# Patient Record
Sex: Male | Born: 1945 | Race: Black or African American | Hispanic: No | Marital: Married | Smoking: Never smoker
Health system: Southern US, Community
[De-identification: ages and names within clinical notes are randomized; demographics above are authoritative.]

## PROBLEM LIST (undated history)

## (undated) DIAGNOSIS — G629 Polyneuropathy, unspecified: Secondary | ICD-10-CM

## (undated) DIAGNOSIS — E785 Hyperlipidemia, unspecified: Secondary | ICD-10-CM

## (undated) DIAGNOSIS — E039 Hypothyroidism, unspecified: Secondary | ICD-10-CM

## (undated) DIAGNOSIS — C801 Malignant (primary) neoplasm, unspecified: Secondary | ICD-10-CM

## (undated) DIAGNOSIS — R188 Other ascites: Secondary | ICD-10-CM

## (undated) DIAGNOSIS — E119 Type 2 diabetes mellitus without complications: Secondary | ICD-10-CM

## (undated) DIAGNOSIS — I10 Essential (primary) hypertension: Secondary | ICD-10-CM

## (undated) DIAGNOSIS — C252 Malignant neoplasm of tail of pancreas: Principal | ICD-10-CM

## (undated) HISTORY — DX: Malignant (primary) neoplasm, unspecified: C80.1

## (undated) HISTORY — DX: Malignant neoplasm of tail of pancreas: C25.2

## (undated) HISTORY — DX: Hyperlipidemia, unspecified: E78.5

## (undated) HISTORY — DX: Polyneuropathy, unspecified: G62.9

## (undated) HISTORY — DX: Other ascites: R18.8

## (undated) HISTORY — DX: Essential (primary) hypertension: I10

## (undated) HISTORY — DX: Type 2 diabetes mellitus without complications: E11.9

## (undated) HISTORY — PX: HEMORRHOID SURGERY: SHX153

---

## 2007-10-12 ENCOUNTER — Ambulatory Visit: Payer: Self-pay | Admitting: Gastroenterology

## 2013-02-17 HISTORY — PX: COLONOSCOPY: SHX174

## 2013-03-11 ENCOUNTER — Ambulatory Visit: Payer: Self-pay | Admitting: Gastroenterology

## 2013-03-15 LAB — PATHOLOGY REPORT

## 2014-02-17 DIAGNOSIS — C801 Malignant (primary) neoplasm, unspecified: Secondary | ICD-10-CM

## 2014-02-17 DIAGNOSIS — R188 Other ascites: Secondary | ICD-10-CM

## 2014-02-17 HISTORY — DX: Malignant (primary) neoplasm, unspecified: C80.1

## 2014-02-17 HISTORY — DX: Other ascites: R18.8

## 2014-02-17 HISTORY — PX: OTHER SURGICAL HISTORY: SHX169

## 2014-04-18 ENCOUNTER — Ambulatory Visit
Admit: 2014-04-18 | Disposition: A | Discharge status: Home / Self Care | Payer: Self-pay | Attending: Hematology and Oncology | Admitting: Hematology and Oncology

## 2014-04-20 ENCOUNTER — Ambulatory Visit: Payer: Self-pay | Admitting: Family Medicine

## 2014-04-20 ENCOUNTER — Emergency Department: Payer: Self-pay | Admitting: Emergency Medicine

## 2014-04-21 ENCOUNTER — Ambulatory Visit
Admit: 2014-04-21 | Disposition: A | Discharge status: Home / Self Care | Payer: Self-pay | Attending: Hematology and Oncology | Admitting: Hematology and Oncology

## 2014-04-21 ENCOUNTER — Ambulatory Visit: Payer: Self-pay | Admitting: Hematology and Oncology

## 2014-04-25 ENCOUNTER — Ambulatory Visit: Payer: Self-pay | Admitting: Hematology and Oncology

## 2014-04-26 ENCOUNTER — Encounter: Payer: Self-pay | Admitting: General Surgery

## 2014-04-26 ENCOUNTER — Ambulatory Visit (INDEPENDENT_AMBULATORY_CARE_PROVIDER_SITE_OTHER): Payer: Medicare Other | Admitting: General Surgery

## 2014-04-26 VITALS — BP 104/68 | HR 78 | Resp 14 | Ht 67.0 in | Wt 138.0 lb

## 2014-04-26 DIAGNOSIS — C252 Malignant neoplasm of tail of pancreas: Secondary | ICD-10-CM

## 2014-04-26 NOTE — Patient Instructions (Signed)

## 2014-04-26 NOTE — Progress Notes (Signed)
Patient ID: Harry Roman, male   DOB: November 07, 1945, 69 y.o.   MRN: [id number]  Chief Complaint  Patient presents with  . Other    eval port placement    HPI Harry Roman is a 69 y.o. male here today for a evaluation of a port placement. He has peritoneal carcinomatosis and ascites, suspected pancreatic CA.  Patient starts chemo on 05/02/14. Weight loss 30-40 pounds over the past year.  He is here today with his wife.  HPI  Past Medical History  Diagnosis Date  . Hypertension   . Diabetes mellitus without complication   . Hyperlipidemia   . Cancer 2016    pancreas  . Ascites 2016    Past Surgical History  Procedure Laterality Date  . Hemorrhoid surgery    . Colonoscopy  Jan 2015    Dr Donnella Sham  . Peritoneal fluid aspiration  2016    History reviewed. No pertinent family history.  Social History History  Substance Use Topics  . Smoking status: Never Smoker   . Smokeless tobacco: Not on file  . Alcohol Use: No    No Known Allergies  Current Outpatient Prescriptions  Medication Sig Dispense Refill  . amLODipine (NORVASC) 10 MG tablet Take 10 mg by mouth daily.    Marland Kitchen aspirin 81 MG tablet Take 81 mg by mouth daily.    . carvedilol (COREG) 12.5 MG tablet Take 12.5 mg by mouth 2 (two) times daily with a meal.    . enalapril (VASOTEC) 20 MG tablet Take 20 mg by mouth daily.    Marland Kitchen glipiZIDE (GLUCOTROL) 10 MG tablet Take 10 mg by mouth 2 (two) times daily before a meal.    . hydrochlorothiazide (HYDRODIURIL) 25 MG tablet Take 25 mg by mouth daily.    . insulin detemir (LEVEMIR) 100 UNIT/ML injection Inject 100 Units into the skin at bedtime.    Marland Kitchen levothyroxine (SYNTHROID, LEVOTHROID) 50 MCG tablet Take 50 mcg by mouth daily before breakfast.    . lovastatin (MEVACOR) 10 MG tablet Take 10 mg by mouth at bedtime.    . metFORMIN (GLUCOPHAGE) 1000 MG tablet Take 1,000 mg by mouth 2 (two) times daily with a meal.    . ondansetron (ZOFRAN) 4 MG tablet Take 1 tablet by mouth every  8 (eight) hours as needed.  0  . oxyCODONE (OXY IR/ROXICODONE) 5 MG immediate release tablet Take 5 mg by mouth every 6 (six) hours as needed. for pain  0  . potassium chloride (MICRO-K) 10 MEQ CR capsule Take 10 mEq by mouth daily.    . sitaGLIPtin (JANUVIA) 50 MG tablet Take 50 mg by mouth daily.     No current facility-administered medications for this visit.    Review of Systems Review of Systems  Constitutional: Positive for unexpected weight change.  Respiratory: Negative.   Cardiovascular: Negative.     Blood pressure 104/68, pulse 78, resp. rate 14, height 5\' 7"  (1.702 m), weight 138 lb (62.596 kg).  Physical Exam Physical Exam  Constitutional: He is oriented to person, place, and time. He appears well-developed and well-nourished.  Eyes: Conjunctivae are normal. No scleral icterus.  Neck: Neck supple.  Cardiovascular: Normal rate, regular rhythm and normal heart sounds.   Pulmonary/Chest: Effort normal and breath sounds normal.  Abdominal: He exhibits distension.  Lymphadenopathy:    He has no cervical adenopathy.  Neurological: He is alert and oriented to person, place, and time.  Skin: Skin is warm and dry.    Data  Reviewed Notes form Oncology  Assessment    CA pancreas     Plan   Discussed port placement, risks and benefits Pt is agreeable.        The risks associated with central venous access including arterial, pulmonary and venous injury were reviewed. The possible need for additional treatment if pulmonary injury occurs (chest tube placement) was discussed.    Finleigh Cheong G 04/26/2014, 10:53 AM

## 2014-04-27 ENCOUNTER — Ambulatory Visit: Payer: Self-pay | Admitting: General Surgery

## 2014-05-01 ENCOUNTER — Encounter: Payer: Self-pay | Admitting: General Surgery

## 2014-05-01 ENCOUNTER — Ambulatory Visit: Payer: Self-pay | Admitting: Hematology and Oncology

## 2014-05-01 DIAGNOSIS — C786 Secondary malignant neoplasm of retroperitoneum and peritoneum: Secondary | ICD-10-CM | POA: Diagnosis not present

## 2014-05-02 ENCOUNTER — Encounter: Payer: Self-pay | Admitting: General Surgery

## 2014-05-08 ENCOUNTER — Encounter: Payer: Self-pay | Admitting: General Surgery

## 2014-05-08 ENCOUNTER — Ambulatory Visit (INDEPENDENT_AMBULATORY_CARE_PROVIDER_SITE_OTHER): Payer: Medicare Other | Admitting: General Surgery

## 2014-05-08 VITALS — BP 116/68 | HR 70 | Resp 16 | Ht 67.0 in | Wt 134.0 lb

## 2014-05-08 DIAGNOSIS — C252 Malignant neoplasm of tail of pancreas: Secondary | ICD-10-CM

## 2014-05-08 NOTE — Progress Notes (Signed)
This is a 69 year old male here today for his post op port placement done on 05/01/14.  Incision looks clean and lungs are clear. Patient will return as needed. Patient can call or come back for any questions or concerns.

## 2014-05-10 ENCOUNTER — Ambulatory Visit: Payer: Medicare Other | Admitting: General Surgery

## 2014-05-11 LAB — COMPREHENSIVE METABOLIC PANEL
Albumin: 2.5 g/dL — ABNORMAL LOW
Alkaline Phosphatase: 434 U/L — ABNORMAL HIGH
Anion Gap: 7 (ref 7–16)
BUN: 15 mg/dL
Bilirubin,Total: 1.2 mg/dL
Calcium, Total: 8.5 mg/dL — ABNORMAL LOW
Chloride: 106 mmol/L
Co2: 23 mmol/L
Creatinine: 0.84 mg/dL
EGFR (African American): >60
EGFR (Non-African Amer.): >60
Glucose: 171 mg/dL — ABNORMAL HIGH
Potassium: 4.4 mmol/L
SGOT(AST): 81 U/L — ABNORMAL HIGH
SGPT (ALT): 40 U/L
Sodium: 136 mmol/L
Total Protein: 6.7 g/dL

## 2014-05-11 LAB — CBC CANCER CENTER
Basophil #: 0 x10 3/mm (ref 0.0–0.1)
Basophil %: 0.3 %
Eosinophil #: 0.1 x10 3/mm (ref 0.0–0.7)
Eosinophil %: 3.1 %
HCT: 31.6 % — ABNORMAL LOW (ref 40.0–52.0)
HGB: 10.3 g/dL — ABNORMAL LOW (ref 13.0–18.0)
Lymphocyte #: 0.9 x10 3/mm — ABNORMAL LOW (ref 1.0–3.6)
Lymphocyte %: 23.2 %
MCH: 25.7 pg — ABNORMAL LOW (ref 26.0–34.0)
MCHC: 32.8 g/dL (ref 32.0–36.0)
MCV: 79 fL — ABNORMAL LOW (ref 80–100)
Monocyte #: 0.4 x10 3/mm (ref 0.2–1.0)
Monocyte %: 11.6 %
Neutrophil #: 2.4 x10 3/mm (ref 1.4–6.5)
Neutrophil %: 61.8 %
Platelet: 225 x10 3/mm (ref 150–440)
RBC: 4.02 10*6/uL — ABNORMAL LOW (ref 4.40–5.90)
RDW: 17.4 % — ABNORMAL HIGH (ref 11.5–14.5)
WBC: 3.9 x10 3/mm (ref 3.8–10.6)

## 2014-05-18 LAB — COMPREHENSIVE METABOLIC PANEL
Albumin: 2.4 g/dL — ABNORMAL LOW
Alkaline Phosphatase: 254 U/L — ABNORMAL HIGH
Anion Gap: 4 — ABNORMAL LOW (ref 7–16)
BUN: 15 mg/dL
Bilirubin,Total: 0.9 mg/dL
Calcium, Total: 8.5 mg/dL — ABNORMAL LOW
Chloride: 108 mmol/L
Co2: 23 mmol/L
Creatinine: 0.72 mg/dL
EGFR (African American): >60
EGFR (Non-African Amer.): >60
Glucose: 103 mg/dL — ABNORMAL HIGH
Potassium: 4.1 mmol/L
SGOT(AST): 32 U/L
SGPT (ALT): 19 U/L
Sodium: 135 mmol/L
Total Protein: 6.5 g/dL

## 2014-05-18 LAB — CREATININE, SERUM: Creatine, Serum: 0.72

## 2014-05-18 LAB — CBC CANCER CENTER
Basophil #: 0 x10 3/mm (ref 0.0–0.1)
Basophil %: 0.4 %
Eosinophil #: 0.1 x10 3/mm (ref 0.0–0.7)
Eosinophil %: 2.4 %
HCT: 30.3 % — ABNORMAL LOW (ref 40.0–52.0)
HGB: 10.1 g/dL — ABNORMAL LOW (ref 13.0–18.0)
Lymphocyte #: 0.6 x10 3/mm — ABNORMAL LOW (ref 1.0–3.6)
Lymphocyte %: 24.3 %
MCH: 25.8 pg — ABNORMAL LOW (ref 26.0–34.0)
MCHC: 33.2 g/dL (ref 32.0–36.0)
MCV: 78 fL — ABNORMAL LOW (ref 80–100)
Monocyte #: 0.3 x10 3/mm (ref 0.2–1.0)
Monocyte %: 12.7 %
Neutrophil #: 1.6 x10 3/mm (ref 1.4–6.5)
Neutrophil %: 60.2 %
Platelet: 123 x10 3/mm — ABNORMAL LOW (ref 150–440)
RBC: 3.91 10*6/uL — ABNORMAL LOW (ref 4.40–5.90)
RDW: 17.4 % — ABNORMAL HIGH (ref 11.5–14.5)
WBC: 2.6 x10 3/mm — ABNORMAL LOW (ref 3.8–10.6)

## 2014-05-19 ENCOUNTER — Ambulatory Visit
Admit: 2014-05-19 | Disposition: A | Discharge status: Home / Self Care | Payer: Self-pay | Attending: Hematology and Oncology | Admitting: Hematology and Oncology

## 2014-05-22 DIAGNOSIS — C259 Malignant neoplasm of pancreas, unspecified: Secondary | ICD-10-CM

## 2014-05-22 DIAGNOSIS — C252 Malignant neoplasm of tail of pancreas: Secondary | ICD-10-CM

## 2014-05-22 DIAGNOSIS — E118 Type 2 diabetes mellitus with unspecified complications: Secondary | ICD-10-CM

## 2014-05-22 DIAGNOSIS — E119 Type 2 diabetes mellitus without complications: Secondary | ICD-10-CM | POA: Insufficient documentation

## 2014-05-22 DIAGNOSIS — E039 Hypothyroidism, unspecified: Secondary | ICD-10-CM

## 2014-05-22 DIAGNOSIS — E785 Hyperlipidemia, unspecified: Secondary | ICD-10-CM | POA: Insufficient documentation

## 2014-05-22 DIAGNOSIS — I10 Essential (primary) hypertension: Secondary | ICD-10-CM | POA: Insufficient documentation

## 2014-05-22 DIAGNOSIS — I159 Secondary hypertension, unspecified: Secondary | ICD-10-CM

## 2014-05-22 HISTORY — DX: Malignant neoplasm of tail of pancreas: C25.2

## 2014-05-25 LAB — CBC CANCER CENTER
Basophil #: 0 x10 3/mm (ref 0.0–0.1)
Basophil %: 0.3 %
Eosinophil #: 0 x10 3/mm (ref 0.0–0.7)
Eosinophil %: 0.3 %
HCT: 28.8 % — ABNORMAL LOW (ref 40.0–52.0)
HGB: 9.5 g/dL — ABNORMAL LOW (ref 13.0–18.0)
Lymphocyte #: 0.8 x10 3/mm — ABNORMAL LOW (ref 1.0–3.6)
Lymphocyte %: 19 %
MCH: 25.7 pg — ABNORMAL LOW (ref 26.0–34.0)
MCHC: 33.1 g/dL (ref 32.0–36.0)
MCV: 78 fL — ABNORMAL LOW (ref 80–100)
Monocyte #: 0.8 x10 3/mm (ref 0.2–1.0)
Monocyte %: 19.5 %
Neutrophil #: 2.4 x10 3/mm (ref 1.4–6.5)
Neutrophil %: 60.9 %
Platelet: 261 x10 3/mm (ref 150–440)
RBC: 3.71 10*6/uL — ABNORMAL LOW (ref 4.40–5.90)
RDW: 17.1 % — ABNORMAL HIGH (ref 11.5–14.5)
WBC: 4 x10 3/mm (ref 3.8–10.6)

## 2014-05-27 ENCOUNTER — Other Ambulatory Visit: Payer: Self-pay | Admitting: Internal Medicine

## 2014-05-27 ENCOUNTER — Emergency Department
Admit: 2014-05-27 | Disposition: A | Discharge status: Home / Self Care | Payer: Self-pay | Admitting: Emergency Medicine

## 2014-05-27 LAB — BASIC METABOLIC PANEL
Anion Gap: 6 — ABNORMAL LOW (ref 7–16)
BUN: 24 mg/dL — ABNORMAL HIGH
CALCIUM: 7.8 mg/dL — AB
Chloride: 110 mmol/L
Co2: 21 mmol/L — ABNORMAL LOW
Creatinine: 0.88 mg/dL
GLUCOSE: 112 mg/dL — AB
Potassium: 3.9 mmol/L
SODIUM: 137 mmol/L

## 2014-05-27 LAB — CBC
HCT: 28.6 % — ABNORMAL LOW (ref 40.0–52.0)
HGB: 9.1 g/dL — AB (ref 13.0–18.0)
MCH: 24.9 pg — ABNORMAL LOW (ref 26.0–34.0)
MCHC: 31.9 g/dL — ABNORMAL LOW (ref 32.0–36.0)
MCV: 78 fL — ABNORMAL LOW (ref 80–100)
Platelet: 269 10*3/uL (ref 150–440)
RBC: 3.67 10*6/uL — ABNORMAL LOW (ref 4.40–5.90)
RDW: 17.5 % — ABNORMAL HIGH (ref 11.5–14.5)
WBC: 7.7 10*3/uL (ref 3.8–10.6)

## 2014-05-27 LAB — PRO B NATRIURETIC PEPTIDE: B-Type Natriuretic Peptide: 45 pg/mL

## 2014-05-27 LAB — TROPONIN I: TROPONIN-I: 0.03 ng/mL

## 2014-05-30 ENCOUNTER — Ambulatory Visit
Admit: 2014-05-30 | Disposition: A | Discharge status: Home / Self Care | Payer: Self-pay | Attending: Family Medicine | Admitting: Family Medicine

## 2014-06-01 LAB — CBC CANCER CENTER
Basophil #: 0.1 x10 3/mm (ref 0.0–0.1)
Basophil %: 0.6 %
Eosinophil #: 0.3 x10 3/mm (ref 0.0–0.7)
Eosinophil %: 2.5 %
HCT: 27.2 % — ABNORMAL LOW (ref 40.0–52.0)
HGB: 9 g/dL — ABNORMAL LOW (ref 13.0–18.0)
Lymphocyte #: 0.9 x10 3/mm — ABNORMAL LOW (ref 1.0–3.6)
Lymphocyte %: 8 %
MCH: 25.3 pg — ABNORMAL LOW (ref 26.0–34.0)
MCHC: 33.1 g/dL (ref 32.0–36.0)
MCV: 77 fL — ABNORMAL LOW (ref 80–100)
Monocyte #: 1.5 x10 3/mm — ABNORMAL HIGH (ref 0.2–1.0)
Monocyte %: 13.9 %
Neutrophil #: 8 x10 3/mm — ABNORMAL HIGH (ref 1.4–6.5)
Neutrophil %: 75 %
Platelet: 404 x10 3/mm (ref 150–440)
RBC: 3.55 10*6/uL — ABNORMAL LOW (ref 4.40–5.90)
RDW: 17.7 % — ABNORMAL HIGH (ref 11.5–14.5)
WBC: 10.7 x10 3/mm — ABNORMAL HIGH (ref 3.8–10.6)

## 2014-06-01 LAB — COMPREHENSIVE METABOLIC PANEL
Albumin: 2 g/dL — ABNORMAL LOW
Alkaline Phosphatase: 179 U/L — ABNORMAL HIGH
Anion Gap: 4 — ABNORMAL LOW (ref 7–16)
BUN: 29 mg/dL — ABNORMAL HIGH
Bilirubin,Total: 0.6 mg/dL
Calcium, Total: 8.3 mg/dL — ABNORMAL LOW
Chloride: 109 mmol/L
Co2: 24 mmol/L
Creatinine: 1.01 mg/dL
EGFR (African American): >60
EGFR (Non-African Amer.): >60
Glucose: 82 mg/dL
Potassium: 4.1 mmol/L
SGOT(AST): 34 U/L
SGPT (ALT): 20 U/L
Sodium: 137 mmol/L
Total Protein: 6 g/dL — ABNORMAL LOW

## 2014-06-01 LAB — MAGNESIUM: Magnesium: 1.9 mg/dL

## 2014-06-02 LAB — CANCER ANTIGEN 19-9: CA 19-9: 738 U/mL — ABNORMAL HIGH (ref 0–35)

## 2014-06-08 LAB — COMPREHENSIVE METABOLIC PANEL
Albumin: 1.7 g/dL — ABNORMAL LOW
Alkaline Phosphatase: 132 U/L — ABNORMAL HIGH
Anion Gap: 2 — ABNORMAL LOW (ref 7–16)
BUN: 12 mg/dL
Bilirubin,Total: 0.6 mg/dL
Calcium, Total: 7.7 mg/dL — ABNORMAL LOW
Chloride: 111 mmol/L
Co2: 24 mmol/L
Creatinine: 0.79 mg/dL
EGFR (African American): >60
EGFR (Non-African Amer.): >60
Glucose: 96 mg/dL
Potassium: 3.9 mmol/L
SGOT(AST): 25 U/L
SGPT (ALT): 14 U/L — ABNORMAL LOW
Sodium: 137 mmol/L
Total Protein: 5.9 g/dL — ABNORMAL LOW

## 2014-06-08 LAB — CBC CANCER CENTER
Basophil #: 0 x10 3/mm (ref 0.0–0.1)
Basophil %: 0.4 %
Eosinophil #: 0.2 x10 3/mm (ref 0.0–0.7)
Eosinophil %: 2.1 %
HCT: 26.1 % — ABNORMAL LOW (ref 40.0–52.0)
HGB: 8.6 g/dL — ABNORMAL LOW (ref 13.0–18.0)
Lymphocyte #: 1 x10 3/mm (ref 1.0–3.6)
Lymphocyte %: 11.8 %
MCH: 25.1 pg — ABNORMAL LOW (ref 26.0–34.0)
MCHC: 32.8 g/dL (ref 32.0–36.0)
MCV: 77 fL — ABNORMAL LOW (ref 80–100)
Monocyte #: 0.8 x10 3/mm (ref 0.2–1.0)
Monocyte %: 9.4 %
Neutrophil #: 6.4 x10 3/mm (ref 1.4–6.5)
Neutrophil %: 76.3 %
Platelet: 270 x10 3/mm (ref 150–440)
RBC: 3.41 10*6/uL — ABNORMAL LOW (ref 4.40–5.90)
RDW: 17.9 % — ABNORMAL HIGH (ref 11.5–14.5)
WBC: 8.4 x10 3/mm (ref 3.8–10.6)

## 2014-06-12 LAB — CYTOLOGY - NON PAP

## 2014-06-14 ENCOUNTER — Other Ambulatory Visit: Payer: Self-pay

## 2014-06-14 DIAGNOSIS — C252 Malignant neoplasm of tail of pancreas: Secondary | ICD-10-CM

## 2014-06-15 LAB — CBC CANCER CENTER
Basophil #: 0 x10 3/mm (ref 0.0–0.1)
Basophil %: 0.3 %
Eosinophil #: 0.2 x10 3/mm (ref 0.0–0.7)
Eosinophil %: 4.5 %
HCT: 24.5 % — ABNORMAL LOW (ref 40.0–52.0)
HGB: 8.3 g/dL — ABNORMAL LOW (ref 13.0–18.0)
Lymphocyte #: 0.9 x10 3/mm — ABNORMAL LOW (ref 1.0–3.6)
Lymphocyte %: 19.7 %
MCH: 25.6 pg — ABNORMAL LOW (ref 26.0–34.0)
MCHC: 33.8 g/dL (ref 32.0–36.0)
MCV: 76 fL — ABNORMAL LOW (ref 80–100)
Monocyte #: 0.6 x10 3/mm (ref 0.2–1.0)
Monocyte %: 13.4 %
Neutrophil #: 2.7 x10 3/mm (ref 1.4–6.5)
Neutrophil %: 62.1 %
Platelet: 157 x10 3/mm (ref 150–440)
RBC: 3.23 10*6/uL — ABNORMAL LOW (ref 4.40–5.90)
RDW: 17.7 % — ABNORMAL HIGH (ref 11.5–14.5)
WBC: 4.3 x10 3/mm (ref 3.8–10.6)

## 2014-06-15 LAB — BASIC METABOLIC PANEL
Anion Gap: 4 — ABNORMAL LOW (ref 7–16)
BUN: 11 mg/dL
Calcium, Total: 7.9 mg/dL — ABNORMAL LOW
Chloride: 108 mmol/L
Co2: 24 mmol/L
Creatinine: 0.72 mg/dL
EGFR (African American): >60
EGFR (Non-African Amer.): >60
Glucose: 111 mg/dL — ABNORMAL HIGH
Potassium: 3.4 mmol/L — ABNORMAL LOW
Sodium: 136 mmol/L

## 2014-06-18 NOTE — Consult Note (Signed)
   Comments   I spoke with Dr Lennox Grumbles at [facility]. Updated her on pt's tx plan for chemotherapy. Informed her of pt's report of hypoglycemia on current diabetes meds. She is aware the pt is to call her for appt and will follow up with him.   Electronic Signatures: Zarion Oliff, Izora Gala (MD)  (Signed 17-Mar-16 08:47)  Authored: Palliative Care   Last Updated: 17-Mar-16 08:47 by Theadore Blunck, Izora Gala (MD)

## 2014-06-18 NOTE — Op Note (Signed)
PATIENT NAME:  Harry Roman, Harry Roman MR#:  [id number] DATE OF BIRTH:  1945/04/10  DATE OF PROCEDURE:  05/01/2014   PREOPERATIVE DIAGNOSIS:  Peritoneal carcinomatosis.   POSTOPERATIVE DIAGNOSIS: Peritoneal carcinomatosis.   OPERATION:  1.  Insertion of venous access port.  2.  Ultrasound under fluoroscopic guidance.  SURGEON: Mckinley Jewel, M.D.   ANESTHESIA: Monitored care and local anesthetic of 0.5% Marcaine mixed with 1% Xylocaine.   COMPLICATIONS: None.   ESTIMATED BLOOD LOSS: Minimal.      PROCEDURE: The patient was placed in the supine position on the operating table. The right upper chest and neck area were prepped and draped out as a sterile field.  Timeout was performed.  Ultrasound probe was brought up to the field and the subclavian vein was identified beneath the lateral ends of the clavicle. After instillation of local anesthetic in the lateral aspect, a small incision was made in the skin and through this, the needle was introduced into the subclavian vein with ultrasound guidance successfully with free withdrawal of blood, and then a Seldinger technique was used to position the catheter going into the superior vena cava. Fluoroscopy was utilized at this time to negotiate this with the catheter preferentially trying to traverse the upper chest towards the left side and with manipulation. This was subsequently guided into the superior vena cava. Skin mark was approximately 18 to 20 cm.  The local anesthetic was instilled over the second interspace area, where a transverse incision was made.  A subcutaneous pocket was created.  The catheter was tunneled through to this site, cut to approximate length and then attached to the prefilled port.  The port was then placed in the pocket and anchored with 3 stitches of 2-0 Prolene to the underlying fascia.  Fluoroscopy was repeated once again to ensure that the catheter was still in proper position and there was no kink along the course of the  catheter.  The port was then flushed through with heparinized saline. Subcutaneous tissue was closed with 3-0 Vicryl and the skin with subcuticular 4-0 Vicryl, covered with LiquiBand. The procedure was well tolerated. He was returned to the recovery room in stable condition     ____________________________ S.Robinette Haines, MD sgs:DT D: 05/01/2014 16:48:21 ET T: 05/01/2014 17:17:18 ET JOB#: [id number]  cc: Synthia Innocent. Jamal Collin, MD, <Dictator> [facility] Robinette Haines MD ELECTRONICALLY SIGNED 05/01/2014 19:08

## 2014-06-22 ENCOUNTER — Other Ambulatory Visit: Payer: Self-pay

## 2014-06-22 ENCOUNTER — Inpatient Hospital Stay: Payer: Medicare Other | Attending: Internal Medicine

## 2014-06-22 DIAGNOSIS — R180 Malignant ascites: Secondary | ICD-10-CM | POA: Insufficient documentation

## 2014-06-22 DIAGNOSIS — D509 Iron deficiency anemia, unspecified: Secondary | ICD-10-CM | POA: Diagnosis not present

## 2014-06-22 DIAGNOSIS — R5383 Other fatigue: Secondary | ICD-10-CM | POA: Diagnosis not present

## 2014-06-22 DIAGNOSIS — R634 Abnormal weight loss: Secondary | ICD-10-CM | POA: Diagnosis not present

## 2014-06-22 DIAGNOSIS — I10 Essential (primary) hypertension: Secondary | ICD-10-CM | POA: Insufficient documentation

## 2014-06-22 DIAGNOSIS — E119 Type 2 diabetes mellitus without complications: Secondary | ICD-10-CM | POA: Insufficient documentation

## 2014-06-22 DIAGNOSIS — C787 Secondary malignant neoplasm of liver and intrahepatic bile duct: Secondary | ICD-10-CM | POA: Diagnosis not present

## 2014-06-22 DIAGNOSIS — Z5111 Encounter for antineoplastic chemotherapy: Secondary | ICD-10-CM | POA: Insufficient documentation

## 2014-06-22 DIAGNOSIS — R600 Localized edema: Secondary | ICD-10-CM | POA: Diagnosis not present

## 2014-06-22 DIAGNOSIS — C252 Malignant neoplasm of tail of pancreas: Secondary | ICD-10-CM

## 2014-06-22 DIAGNOSIS — E785 Hyperlipidemia, unspecified: Secondary | ICD-10-CM | POA: Insufficient documentation

## 2014-06-22 DIAGNOSIS — E538 Deficiency of other specified B group vitamins: Secondary | ICD-10-CM | POA: Insufficient documentation

## 2014-06-22 DIAGNOSIS — Z79899 Other long term (current) drug therapy: Secondary | ICD-10-CM | POA: Diagnosis not present

## 2014-06-22 LAB — CBC WITH DIFFERENTIAL/PLATELET
Basophils Absolute: 0 10*3/uL (ref 0–0.1)
Basophils Relative: 0 %
Eosinophils Absolute: 0.1 10*3/uL (ref 0–0.7)
Eosinophils Relative: 3 %
HCT: 26.9 % — ABNORMAL LOW (ref 40.0–52.0)
Hemoglobin: 8.8 g/dL — ABNORMAL LOW (ref 13.0–18.0)
Lymphocytes Relative: 22 %
Lymphs Abs: 0.9 10*3/uL — ABNORMAL LOW (ref 1.0–3.6)
MCH: 25.3 pg — ABNORMAL LOW (ref 26.0–34.0)
MCHC: 32.8 g/dL (ref 32.0–36.0)
MCV: 77.2 fL — ABNORMAL LOW (ref 80.0–100.0)
Monocytes Absolute: 0.7 10*3/uL (ref 0.2–1.0)
Monocytes Relative: 16 %
Neutro Abs: 2.5 10*3/uL (ref 1.4–6.5)
Neutrophils Relative %: 59 %
Platelets: 236 10*3/uL (ref 150–440)
RBC: 3.48 MIL/uL — ABNORMAL LOW (ref 4.40–5.90)
RDW: 18.9 % — ABNORMAL HIGH (ref 11.5–14.5)
WBC: 4.3 10*3/uL (ref 3.8–10.6)

## 2014-06-26 ENCOUNTER — Telehealth: Payer: Self-pay | Admitting: *Deleted

## 2014-06-26 NOTE — Telephone Encounter (Signed)
Requesting prescription for hospital bed be faxed to [facility] and call her to let her know when this is done

## 2014-06-26 NOTE — Telephone Encounter (Signed)
[facility] is agreeable for patient to have a hospital bed. MD will sign order and 06-27-14. Patients wife made aware.

## 2014-06-29 ENCOUNTER — Inpatient Hospital Stay: Payer: Medicare Other

## 2014-06-29 ENCOUNTER — Inpatient Hospital Stay (HOSPITAL_BASED_OUTPATIENT_CLINIC_OR_DEPARTMENT_OTHER): Payer: Medicare Other | Admitting: Hematology and Oncology

## 2014-06-29 VITALS — BP 119/80 | HR 66 | Temp 98.4°F | Ht 67.0 in | Wt 147.3 lb

## 2014-06-29 DIAGNOSIS — R180 Malignant ascites: Secondary | ICD-10-CM

## 2014-06-29 DIAGNOSIS — Z5111 Encounter for antineoplastic chemotherapy: Secondary | ICD-10-CM | POA: Diagnosis not present

## 2014-06-29 DIAGNOSIS — E785 Hyperlipidemia, unspecified: Secondary | ICD-10-CM

## 2014-06-29 DIAGNOSIS — R634 Abnormal weight loss: Secondary | ICD-10-CM

## 2014-06-29 DIAGNOSIS — C787 Secondary malignant neoplasm of liver and intrahepatic bile duct: Secondary | ICD-10-CM

## 2014-06-29 DIAGNOSIS — D509 Iron deficiency anemia, unspecified: Secondary | ICD-10-CM

## 2014-06-29 DIAGNOSIS — R600 Localized edema: Secondary | ICD-10-CM

## 2014-06-29 DIAGNOSIS — E538 Deficiency of other specified B group vitamins: Secondary | ICD-10-CM | POA: Insufficient documentation

## 2014-06-29 DIAGNOSIS — D649 Anemia, unspecified: Secondary | ICD-10-CM

## 2014-06-29 DIAGNOSIS — I10 Essential (primary) hypertension: Secondary | ICD-10-CM

## 2014-06-29 DIAGNOSIS — Z79899 Other long term (current) drug therapy: Secondary | ICD-10-CM

## 2014-06-29 DIAGNOSIS — C252 Malignant neoplasm of tail of pancreas: Secondary | ICD-10-CM

## 2014-06-29 DIAGNOSIS — R5383 Other fatigue: Secondary | ICD-10-CM

## 2014-06-29 DIAGNOSIS — E119 Type 2 diabetes mellitus without complications: Secondary | ICD-10-CM

## 2014-06-29 LAB — COMPREHENSIVE METABOLIC PANEL
ALT: 9 U/L — ABNORMAL LOW (ref 17–63)
AST: 19 U/L (ref 15–41)
Albumin: 1.8 g/dL — ABNORMAL LOW (ref 3.5–5.0)
Alkaline Phosphatase: 78 U/L (ref 38–126)
Anion gap: 4 — ABNORMAL LOW (ref 5–15)
BUN: 11 mg/dL (ref 6–20)
CO2: 21 mmol/L — ABNORMAL LOW (ref 22–32)
Calcium: 7.5 mg/dL — ABNORMAL LOW (ref 8.9–10.3)
Chloride: 108 mmol/L (ref 101–111)
Creatinine, Ser: 0.72 mg/dL (ref 0.61–1.24)
GFR calc Af Amer: >60 mL/min (ref >60)
GFR calc non Af Amer: >60 mL/min (ref >60)
Glucose, Bld: 100 mg/dL — ABNORMAL HIGH (ref 65–99)
Potassium: 3.5 mmol/L (ref 3.5–5.1)
Sodium: 133 mmol/L — ABNORMAL LOW (ref 135–145)
Total Bilirubin: 0.4 mg/dL (ref 0.3–1.2)
Total Protein: 5.9 g/dL — ABNORMAL LOW (ref 6.5–8.1)

## 2014-06-29 LAB — CBC WITH DIFFERENTIAL/PLATELET
Basophils Absolute: 0.1 10*3/uL (ref 0–0.1)
Basophils Relative: 1 %
Eosinophils Absolute: 0.1 10*3/uL (ref 0–0.7)
Eosinophils Relative: 2 %
HCT: 27.5 % — ABNORMAL LOW (ref 40.0–52.0)
Hemoglobin: 8.8 g/dL — ABNORMAL LOW (ref 13.0–18.0)
Lymphocytes Relative: 22 %
Lymphs Abs: 1.4 10*3/uL (ref 1.0–3.6)
MCH: 25 pg — ABNORMAL LOW (ref 26.0–34.0)
MCHC: 31.9 g/dL — ABNORMAL LOW (ref 32.0–36.0)
MCV: 78.4 fL — ABNORMAL LOW (ref 80.0–100.0)
Monocytes Absolute: 1.1 10*3/uL — ABNORMAL HIGH (ref 0.2–1.0)
Monocytes Relative: 18 %
Neutro Abs: 3.7 10*3/uL (ref 1.4–6.5)
Neutrophils Relative %: 57 %
Platelets: 552 10*3/uL — ABNORMAL HIGH (ref 150–440)
RBC: 3.51 MIL/uL — ABNORMAL LOW (ref 4.40–5.90)
RDW: 21.9 % — ABNORMAL HIGH (ref 11.5–14.5)
WBC: 6.4 10*3/uL (ref 3.8–10.6)

## 2014-06-29 LAB — IRON AND TIBC
Iron: 18 ug/dL — ABNORMAL LOW (ref 45–182)
Saturation Ratios: 14 % — ABNORMAL LOW (ref 17.9–39.5)
TIBC: 133 ug/dL — ABNORMAL LOW (ref 250–450)
UIBC: 115 ug/dL

## 2014-06-29 LAB — MAGNESIUM: Magnesium: 1.8 mg/dL (ref 1.7–2.4)

## 2014-06-29 LAB — FERRITIN: Ferritin: 355 ng/mL — ABNORMAL HIGH (ref 24–336)

## 2014-06-29 MED ORDER — SODIUM CHLORIDE 0.9 % IV SOLN
Freq: Once | INTRAVENOUS | Status: AC
  Administered 2014-06-29: 13:00:00 via INTRAVENOUS
  Filled 2014-06-29: qty 250

## 2014-06-29 MED ORDER — GEMCITABINE HCL CHEMO INJECTION 1 GM/26.3ML
800.0000 mg/m2 | Freq: Once | INTRAVENOUS | Status: AC
  Administered 2014-06-29: 1368 mg via INTRAVENOUS
  Filled 2014-06-29: qty 36

## 2014-06-29 MED ORDER — HEPARIN SOD (PORK) LOCK FLUSH 100 UNIT/ML IV SOLN
500.0000 [IU] | Freq: Once | INTRAVENOUS | Status: AC | PRN
  Administered 2014-06-29: 500 [IU]
  Filled 2014-06-29: qty 5

## 2014-06-29 MED ORDER — PACLITAXEL PROTEIN-BOUND CHEMO INJECTION 100 MG
100.0000 mg/m2 | Freq: Once | INTRAVENOUS | Status: AC
  Administered 2014-06-29: 175 mg via INTRAVENOUS
  Filled 2014-06-29: qty 35

## 2014-06-29 MED ORDER — SODIUM CHLORIDE 0.9 % IJ SOLN
10.0000 mL | INTRAMUSCULAR | Status: DC | PRN
  Administered 2014-06-29: 10 mL
  Filled 2014-06-29: qty 10

## 2014-06-29 MED ORDER — SODIUM CHLORIDE 0.9 % IV SOLN
Freq: Once | INTRAVENOUS | Status: AC
  Administered 2014-06-29: 13:00:00 via INTRAVENOUS
  Filled 2014-06-29: qty 4

## 2014-06-29 MED ORDER — CYANOCOBALAMIN 1000 MCG/ML IJ SOLN
1000.0000 ug | Freq: Once | INTRAMUSCULAR | Status: AC
  Administered 2014-06-29: 1000 ug via INTRAMUSCULAR
  Filled 2014-06-29: qty 1

## 2014-06-29 NOTE — Progress Notes (Signed)
Pt here today for follow up regarding pancreatic cancer and treatment; offers no complaints today

## 2014-06-30 ENCOUNTER — Telehealth: Payer: Self-pay | Admitting: *Deleted

## 2014-06-30 LAB — CANCER ANTIGEN 19-9: CA 19-9: 365 U/mL — ABNORMAL HIGH (ref 0–35)

## 2014-06-30 NOTE — Telephone Encounter (Signed)
States that [facility] is telling her we have not sent everything they need in order to get a hospital bed for pt. Can you please call them and see what they are needing

## 2014-06-30 NOTE — Telephone Encounter (Signed)
Called and asked for return call and there is no answer at either number

## 2014-06-30 NOTE — Telephone Encounter (Signed)
Spoke with Corene Cornea at [facility]; he states they cannot accept a written order for a hospital bed for this pt.  It needs to be documented in the patient's chart as to why they need a hospital bed; Dr. Mike Gip notified via In Basket (half day off) to please complete this and let me know so I can fax  [facility].

## 2014-07-04 ENCOUNTER — Encounter: Payer: Self-pay | Admitting: Hematology and Oncology

## 2014-07-04 NOTE — Progress Notes (Signed)
[facility] day:  06/29/2014  Chief Complaint: Harry Roman is an 69 y.o. male with metastatic pancreatic cancer who is seen for assessment prior to cycle #3 gemcitabine and Abraxane.  HPI: The patient was last seen in the medical oncology clinic on 06/08/2014.  At that time, he is a day 8 chemotherapy. He noted some fatigue. He was eating small frequent meals. Exam is stable. He tolerated his chemotherapy well.  He has continued to receive his chemotherapy weekly (3 weeks on 1 week off).   Symptomatically he states that he feels alright. He has some constipation alternating with diarrhea. He is eating a little more closely small frequent meals. He is drinking ensure or boost twice a day. Pain control is good and (1-2 out of 10). He is off his morphine as he associates this with increased side effects. He is tolerating his oxycodone. He has some ankle swelling and discomfort due to the swelling.  His wife notes that he is resting most of the day.     Past Medical History  Diagnosis Date  . Hypertension   . Diabetes mellitus without complication   . Hyperlipidemia   . Cancer 2016    pancreas  . Ascites 2016  . 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 Hemorrhoid surgery    . Colonoscopy  Jan 2015    Dr Donnella Sham  . Peritoneal fluid aspiration  2016    No family history on file.  Social History:  reports that he has never smoked. He does not have any smokeless tobacco history on file. He reports that he does not drink alcohol or use illicit drugs.  The patient is accompanied by his wife.  Allergies: No Known Allergies  Current Medications: Current Outpatient Prescriptions  Medication Sig Dispense Refill  . amLODipine (NORVASC) 10 MG tablet Take 10 mg by mouth daily.    Marland Kitchen aspirin 81 MG tablet Take 81 mg by mouth daily.    . carvedilol (COREG) 12.5 MG tablet Take 12.5 mg by  mouth 2 (two) times daily with a meal.    . enalapril (VASOTEC) 20 MG tablet Take 20 mg by mouth daily.    Marland Kitchen glipiZIDE (GLUCOTROL) 10 MG tablet Take 10 mg by mouth 2 (two) times daily before a meal.    . hydrochlorothiazide (HYDRODIURIL) 25 MG tablet Take 25 mg by mouth daily.    . insulin detemir (LEVEMIR) 100 UNIT/ML injection Inject 100 Units into the skin at bedtime.    Marland Kitchen levothyroxine (SYNTHROID, LEVOTHROID) 50 MCG tablet Take 50 mcg by mouth daily before breakfast.    . linagliptin (TRADJENTA) 5 MG TABS tablet Take 5 mg by mouth daily.    Marland Kitchen lovastatin (MEVACOR) 10 MG tablet Take 10 mg by mouth at bedtime.    . metFORMIN (GLUCOPHAGE) 1000 MG tablet Take 1,000 mg by mouth 2 (two) times daily with a meal.    . mirtazapine (REMERON) 15 MG tablet Take 15 mg by mouth every evening.  0  . ondansetron (ZOFRAN) 4 MG tablet Take 1 tablet by mouth every 8 (eight) hours as needed.  0  . oxyCODONE (OXY IR/ROXICODONE) 5 MG immediate release tablet Take 5 mg by mouth every 6 (six) hours as needed. for pain  0  . Oxycodone HCl 10 MG TABS Take 10 mg by mouth every 12 (twelve) hours.    . potassium chloride (MICRO-K) 10  MEQ CR capsule Take 10 mEq by mouth daily.    . sitaGLIPtin (JANUVIA) 50 MG tablet Take 50 mg by mouth daily.     No current facility-administered medications for this visit.   Review of Systems:  GENERAL:  Feels "alright, I reckon".  Fatigue.  Rests most of day.  No fevers, sweats or weight loss. PERFORMANCE STATUS (ECOG):  2 HEENT:  No visual changes, runny nose, sore throat, mouth sores or tenderness. Lungs: No shortness of breath or cough.  No hemoptysis. Cardiac:  No chest pain, palpitations, orthopnea, or PND. GI:  Eating small frequent meals.   Diarrhea alternating with constipation.  No nausea, vomiting, melena or hematochezia. GU:  No urgency, frequency, dysuria, or hematuria. Musculoskeletal:  No back pain.  No joint pain.  No muscle tenderness. Extremities:  Ankle pain due  to swelling. Skin:  No rashes or skin changes. Neuro:  No headache, numbness or weakness, balance or coordination issues. Endocrine:  No diabetes, thyroid issues, hot flashes or night sweats. Psych:  No mood changes, depression or anxiety. Pain:  Pain well controlled. Review of systems:  All other systems reviewed and found to be negative.  Physical Exam: Blood pressure 119/80, pulse 66, temperature 98.4 F (36.9 C), temperature source Tympanic, height _0  (1.702 m), weight 147 lb 4.3 oz (66.8 kg).  GENERAL:  Chronically fatigued elderly gentleman sitting in a wheelchair in the exam room in no acute distress. MENTAL STATUS:  Alert and oriented to person, place and time. HEAD:  Wearing a cap.  Alopecia.  Scruffy beard.  Normocephalic, atraumatic, face symmetric, no Cushingoid features. EYES:  Brown eyes.  Pupils equal round and reactive to light and accomodation.  No conjunctivitis or scleral icterus. ENT:  Oropharynx clear without lesion.  Tongue normal. Mucous membranes moist.  RESPIRATORY:  Clear to auscultation without rales, wheezes or rhonchi. CARDIOVASCULAR:  Regular rate and rhythm without murmur, rub or gallop. ABDOMEN: Distended.  Soft, non-tender, with active bowel sounds, and no hepatosplenomegaly.  Sister Wynona Dove nodule no longer palpable. SKIN:  No rashes, ulcers or lesions. EXTREMITIES: Chronic lower extremity changes.  No palpable cords. LYMPH NODES: No palpable cervical, supraclavicular, axillary or inguinal adenopathy  NEUROLOGICAL: Unremarkable. PSYCH:  Appropriate.  Infusion on 06/29/2014  Component Date Value Ref Range Status  . Magnesium 06/29/2014 1.8  1.7 - 2.4 mg/dL Final  . Ferritin 06/29/2014 355* 24 - 336 ng/mL Final  . Iron 06/29/2014 18* 45 - 182 ug/dL Final  . TIBC 06/29/2014 133* 250 - 450 ug/dL Final  . Saturation Ratios 06/29/2014 14* 17.9 - 39.5 % Final  . UIBC 06/29/2014 115   Final  . WBC 06/29/2014 6.4  3.8 - 10.6 K/uL Final  . RBC  06/29/2014 3.51* 4.40 - 5.90 MIL/uL Final  . Hemoglobin 06/29/2014 8.8* 13.0 - 18.0 g/dL Final  . HCT 06/29/2014 27.5* 40.0 - 52.0 % Final  . MCV 06/29/2014 78.4* 80.0 - 100.0 fL Final  . MCH 06/29/2014 25.0* 26.0 - 34.0 pg Final  . MCHC 06/29/2014 31.9* 32.0 - 36.0 g/dL Final  . RDW 06/29/2014 21.9* 11.5 - 14.5 % Final  . Platelets 06/29/2014 552* 150 - 440 K/uL Final  . Neutrophils Relative % 06/29/2014 57   Final  . Neutro Abs 06/29/2014 3.7  1.4 - 6.5 K/uL Final  . Lymphocytes Relative 06/29/2014 22   Final  . Lymphs Abs 06/29/2014 1.4  1.0 - 3.6 K/uL Final  . Monocytes Relative 06/29/2014 18   Final  .  Monocytes Absolute 06/29/2014 1.1* 0.2 - 1.0 K/uL Final  . Eosinophils Relative 06/29/2014 2   Final  . Eosinophils Absolute 06/29/2014 0.1  0 - 0.7 K/uL Final  . Basophils Relative 06/29/2014 1   Final  . Basophils Absolute 06/29/2014 0.1  0 - 0.1 K/uL Final  . CA 19-9 06/29/2014 365* 0 - 35 U/mL Final   Comment: (NOTE) Roche ECLIA methodology Performed At: [facility] [place], [place] [id number] Lindon Romp MD PO:[id number]   . Sodium 06/29/2014 133* 135 - 145 mmol/L Final  . Potassium 06/29/2014 3.5  3.5 - 5.1 mmol/L Final  . Chloride 06/29/2014 108  101 - 111 mmol/L Final  . CO2 06/29/2014 21* 22 - 32 mmol/L Final  . Glucose, Bld 06/29/2014 100* 65 - 99 mg/dL Final  . BUN 06/29/2014 11  6 - 20 mg/dL Final  . Creatinine, Ser 06/29/2014 0.72  0.61 - 1.24 mg/dL Final  . Calcium 06/29/2014 7.5* 8.9 - 10.3 mg/dL Final  . Total Protein 06/29/2014 5.9* 6.5 - 8.1 g/dL Final  . Albumin 06/29/2014 1.8* 3.5 - 5.0 g/dL Final  . AST 06/29/2014 19  15 - 41 U/L Final  . ALT 06/29/2014 9* 17 - 63 U/L Final  . Alkaline Phosphatase 06/29/2014 78  38 - 126 U/L Final  . Total Bilirubin 06/29/2014 0.4  0.3 - 1.2 mg/dL Final  . GFR calc non Af Amer 06/29/2014 >60  >60 mL/min Final  . GFR calc Af Amer 06/29/2014 >60  >60 mL/min Final   Comment: (NOTE) The eGFR  has been calculated using the CKD EPI equation. This calculation has not been validated in all clinical situations. eGFR's persistently <60 mL/min signify possible Chronic Kidney Disease.   . Anion gap 06/29/2014 4* 5 - 15 Final    Assessment:  Harry Roman is an 69 y.o. African-American gentleman with metastatic pancreatic cancer.  He presented with unexplained weight loss 1 year, intermittent nausea vomiting 1 month, and a 1 week history of abdominal distention.  He lost 30-40 pounds in the past year.  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 Chest CT on 05/01/2014 revealed no evidence of metastatic disease.  Colonoscopy on 02/2013 was negative.  CA19-9 was 713 on 04/21/2014.  He underwent paracentesis x 2 (1.85 liters on 04/21/2014 and 2.9 liters on 05/01/2014).  Cytology was negative.  Omental biopsy on 04/21/2014 revealed metastatic adenocarcinoma consistent with pancreatic cancer (CA19-9 stains were positive).  He is B12 deficient (B12 was 34 on 04/21/2014).  He is on B12 supplimentation.  He has a microcytic anemia consistent with iron deficiency.  He is status post 2 cycles of gemcitabine and Abraxane (05/04/2014 - 06/01/2014).  Sister Wynona Dove nodule has resolved.  He has not required a paracentesis since 03/14/206.  CA19-9 has decreased from 1322 on 05/04/2014, 738 on 06/01/2014, and 365 today.  Symptomatically, he remains fatigued.  He is eating more.  He has diarrhea alternating with constipation.  Pain is well controlled.  Exam is stable.    Plan: 1. Labs today- CBC with diff, CMP, magnesium, CA19-9, ferritin, iron studies. 2. Day 1 of cycle #3 gemcitabine and Abraxane today.  Continue 3 weeks on/1 week  off. 3. B12 today.  Continue  on first day of each cycle.. 4. Rx:  hospital bed secondary to patient's debilitation. 5. Abdomen and pelvic CT scan prior to next cycle.. 6. Discuss iron rich food and trial of ferrous sulfate 1 tablet q day with OJ or vitamin C.  [facility] as tolerated.  Consider IV iron if unsuccessful. 7. RTC in 4 weeks for MD assessment, labs (CBC with diff, CMP, CA19-9), cycle #4, and B12.  Lequita Asal, MD  06/29/2014, 5:01 PM

## 2014-07-06 ENCOUNTER — Other Ambulatory Visit: Payer: Medicare Other

## 2014-07-06 ENCOUNTER — Emergency Department: Payer: Medicare Other

## 2014-07-06 ENCOUNTER — Encounter: Payer: Self-pay | Admitting: *Deleted

## 2014-07-06 ENCOUNTER — Ambulatory Visit: Payer: Medicare Other

## 2014-07-06 ENCOUNTER — Telehealth: Payer: Self-pay

## 2014-07-06 ENCOUNTER — Other Ambulatory Visit: Payer: Self-pay

## 2014-07-06 ENCOUNTER — Emergency Department
Admission: EM | Admit: 2014-07-06 | Discharge: 2014-07-06 | Disposition: A | Discharge status: Home / Self Care | Payer: Medicare Other | Attending: Emergency Medicine | Admitting: Emergency Medicine

## 2014-07-06 DIAGNOSIS — I10 Essential (primary) hypertension: Secondary | ICD-10-CM | POA: Insufficient documentation

## 2014-07-06 DIAGNOSIS — Z79899 Other long term (current) drug therapy: Secondary | ICD-10-CM | POA: Diagnosis not present

## 2014-07-06 DIAGNOSIS — E11649 Type 2 diabetes mellitus with hypoglycemia without coma: Secondary | ICD-10-CM | POA: Insufficient documentation

## 2014-07-06 DIAGNOSIS — E162 Hypoglycemia, unspecified: Secondary | ICD-10-CM

## 2014-07-06 DIAGNOSIS — R4182 Altered mental status, unspecified: Secondary | ICD-10-CM | POA: Diagnosis not present

## 2014-07-06 DIAGNOSIS — Z7982 Long term (current) use of aspirin: Secondary | ICD-10-CM | POA: Insufficient documentation

## 2014-07-06 LAB — CBC WITH DIFFERENTIAL/PLATELET
Basophils Absolute: 0 10*3/uL (ref 0–0.1)
Basophils Relative: 0 %
EOS PCT: 0 %
Eosinophils Absolute: 0 10*3/uL (ref 0–0.7)
HCT: 29.1 % — ABNORMAL LOW (ref 40.0–52.0)
Hemoglobin: 9.3 g/dL — ABNORMAL LOW (ref 13.0–18.0)
LYMPHS ABS: 1 10*3/uL (ref 1.0–3.6)
LYMPHS PCT: 15 %
MCH: 25.3 pg — ABNORMAL LOW (ref 26.0–34.0)
MCHC: 31.9 g/dL — ABNORMAL LOW (ref 32.0–36.0)
MCV: 79.3 fL — ABNORMAL LOW (ref 80.0–100.0)
MONOS PCT: 9 %
Monocytes Absolute: 0.6 10*3/uL (ref 0.2–1.0)
Neutro Abs: 5.1 10*3/uL (ref 1.4–6.5)
Neutrophils Relative %: 76 %
PLATELETS: 333 10*3/uL (ref 150–440)
RBC: 3.67 MIL/uL — AB (ref 4.40–5.90)
RDW: 22.8 % — ABNORMAL HIGH (ref 11.5–14.5)
WBC: 6.7 10*3/uL (ref 3.8–10.6)

## 2014-07-06 LAB — COMPREHENSIVE METABOLIC PANEL
ALT: 9 U/L — ABNORMAL LOW (ref 17–63)
ANION GAP: 5 (ref 5–15)
AST: 24 U/L (ref 15–41)
Albumin: 1.7 g/dL — ABNORMAL LOW (ref 3.5–5.0)
Alkaline Phosphatase: 70 U/L (ref 38–126)
BILIRUBIN TOTAL: 0.3 mg/dL (ref 0.3–1.2)
BUN: 9 mg/dL (ref 6–20)
CALCIUM: 7.4 mg/dL — AB (ref 8.9–10.3)
CHLORIDE: 111 mmol/L (ref 101–111)
CO2: 22 mmol/L (ref 22–32)
CREATININE: 0.62 mg/dL (ref 0.61–1.24)
GFR calc Af Amer: >60 mL/min (ref >60)
GLUCOSE: 90 mg/dL (ref 65–99)
Potassium: 3.9 mmol/L (ref 3.5–5.1)
Sodium: 138 mmol/L (ref 135–145)
Total Protein: 6 g/dL — ABNORMAL LOW (ref 6.5–8.1)

## 2014-07-06 LAB — GLUCOSE, CAPILLARY
GLUCOSE-CAPILLARY: 103 mg/dL — AB (ref 65–99)
Glucose-Capillary: 82 mg/dL (ref 65–99)
Glucose-Capillary: 74 mg/dL (ref 65–99)
Glucose-Capillary: 110 mg/dL — ABNORMAL HIGH (ref 65–99)

## 2014-07-06 LAB — LIPASE, BLOOD: Lipase: 20 U/L — ABNORMAL LOW (ref 22–51)

## 2014-07-06 NOTE — Telephone Encounter (Signed)
Pt's wife Ruby contacted office to let us know he was in the ER due to low blood sugars, called EMS; wife is also requesting [place] referral for assistance with his care, ADL's, weakness etc; Dr. Mike Gip aware

## 2014-07-06 NOTE — ED Notes (Signed)
Pt eat entire meal tray.

## 2014-07-06 NOTE — ED Notes (Signed)
Pt here with family at bedside.  Wife reports pt had glucose of 38 at home this am.  Called EMS who started IV and gave D 50.  Pt now awake and alert.

## 2014-07-06 NOTE — ED Notes (Signed)
Pt up to [facility] without problems.

## 2014-07-06 NOTE — Discharge Instructions (Signed)
Please seek medical attention for any high fevers, chest pain, shortness of breath, change in behavior, persistent vomiting, bloody stool or any other new or concerning symptoms.  Hypoglycemia Hypoglycemia occurs when the glucose in your blood is too low. Glucose is a type of sugar that is your body's main energy source. Hormones, such as insulin and glucagon, control the level of glucose in the blood. Insulin lowers blood glucose and glucagon increases blood glucose. Having too much insulin in your blood stream, or not eating enough food containing sugar, can result in hypoglycemia. Hypoglycemia can happen to people with or without diabetes. It can develop quickly and can be a medical emergency.  CAUSES   Missing or delaying meals.  Not eating enough carbohydrates at meals.  Taking too much diabetes medicine.  Not timing your oral diabetes medicine or insulin doses with meals, snacks, and exercise.  Nausea and vomiting.  Certain medicines.  Severe illnesses, such as hepatitis, kidney disorders, and certain eating disorders.  Increased activity or exercise without eating something extra or adjusting medicines.  Drinking too much alcohol.  A nerve disorder that affects body functions like your heart rate, blood pressure, and digestion (autonomic neuropathy).  A condition where the stomach muscles do not function properly (gastroparesis). Therefore, medicines and food may not absorb properly.  Rarely, a tumor of the pancreas can produce too much insulin. SYMPTOMS   Hunger.  Sweating (diaphoresis).  Change in body temperature.  Shakiness.  Headache.  Anxiety.  Lightheadedness.  Irritability.  Difficulty concentrating.  Dry mouth.  Tingling or numbness in the hands or feet.  Restless sleep or sleep disturbances.  Altered speech and coordination.  Change in mental status.  Seizures or prolonged convulsions.  Combativeness.  Drowsiness  (lethargic).  Weakness.  Increased heart rate or palpitations.  Confusion.  Pale, gray skin color.  Blurred or double vision.  Fainting. DIAGNOSIS  A physical exam and medical history will be performed. Your caregiver may make a diagnosis based on your symptoms. Blood tests and other lab tests may be performed to confirm a diagnosis. Once the diagnosis is made, your caregiver will see if your signs and symptoms go away once your blood glucose is raised.  TREATMENT  Usually, you can easily treat your hypoglycemia when you notice symptoms.  Check your blood glucose. If it is less than 70 mg/dl, take one of the following:   3-4 glucose tablets.    cup juice.    cup regular soda.   1 cup skim milk.   -1 tube of glucose gel.   5-6 hard candies.   Avoid high-fat drinks or food that may delay a rise in blood glucose levels.  Do not take more than the recommended amount of sugary foods, drinks, gel, or tablets. Doing so will cause your blood glucose to go too high.   Wait 10-15 minutes and recheck your blood glucose. If it is still less than 70 mg/dl or below your target range, repeat treatment.   Eat a snack if it is more than 1 hour until your next meal.  There may be a time when your blood glucose may go so low that you are unable to treat yourself at home when you start to notice symptoms. You may need someone to help you. You may even faint or be unable to swallow. If you cannot treat yourself, someone will need to bring you to the hospital.  [place]  If you have diabetes, follow your diabetes management plan  by:  Taking your medicines as directed.  Following your exercise plan.  Following your meal plan. Do not skip meals. Eat on time.  Testing your blood glucose regularly. Check your blood glucose before and after exercise. If you exercise longer or different than usual, be sure to check blood glucose more frequently.  Wearing your  medical alert jewelry that says you have diabetes.  Identify the cause of your hypoglycemia. Then, develop ways to prevent the recurrence of hypoglycemia.  Do not take a hot bath or shower right after an insulin shot.  Always carry treatment with you. Glucose tablets are the easiest to carry.  If you are going to drink alcohol, drink it only with meals.  Tell friends or family members ways to keep you safe during a seizure. This may include removing hard or sharp objects from the area or turning you on your side.  Maintain a healthy weight. SEEK MEDICAL CARE IF:   You are having problems keeping your blood glucose in your target range.  You are having frequent episodes of hypoglycemia.  You feel you might be having side effects from your medicines.  You are not sure why your blood glucose is dropping so low.  You notice a change in vision or a new problem with your vision. SEEK IMMEDIATE MEDICAL CARE IF:   Confusion develops.  A change in mental status occurs.  The inability to swallow develops.  Fainting occurs. Document Released: 02/03/2005 Document Revised: 02/08/2013 Document Reviewed: 06/02/2011 [facility] Patient Information 2015 [place], [place]. This information is not intended to replace advice given to you by your health care provider. Make sure you discuss any questions you have with your health care provider.

## 2014-07-06 NOTE — ED Provider Notes (Signed)
[facility] Emergency Department Provider Note    ____________________________________________  Time seen: On EMS arrival  I have reviewed the triage vital signs and the nursing notes.   HISTORY  Chief Complaint Hypoglycemia   History limited by: not completely oriented, some history obtained from EMS   HPI QUANTAVIOUS EGGERT is a 69 y.o. male who is brought to the emergency department by EMS today because of some altered mental status and concern for hypoglycemia. The patient was found to be hypoglycemic 238 this morning by his wife. Upon EMS arrival the patient was slightly altered. They did establish an IV and give 1 amp of D50 which brought sugars initially up into the 400s. Roughly 5 minutes prior to arrival the sugars were now in the 100s. The paramedics stated that the patient did appear to improve mentally after the dextrose. However he was never completely oriented. Patient did get 4 units of insulin last night prior to going to bed when his sugars were in the 70s. Patient himself denied any fever, pain.     Past Medical History  Diagnosis Date  . Hypertension   . Diabetes mellitus without complication   . Hyperlipidemia   . Cancer 2016    pancreas  . Ascites 2016  . 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 B12 deficiency 06/29/2014  . 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 Hypothyroidism 05/22/2014  . Hypertension 05/22/2014  . Hyperlipemia 05/22/2014  . Diabetes mellitus, type 2 05/22/2014    Past Surgical History  Procedure Laterality Date  . Hemorrhoid surgery    . Colonoscopy  Jan 2015    Dr Donnella Sham  . Peritoneal fluid aspiration  2016    Current Outpatient Rx  Name  Route  Sig  Dispense  Refill  . amLODipine (NORVASC) 10 MG tablet   Oral   Take 10 mg by mouth daily.         Marland Kitchen aspirin 81 MG tablet   Oral   Take 81 mg by mouth daily.         . carvedilol (COREG) 12.5 MG tablet   Oral   Take 12.5 mg by mouth 2 (two) times daily with a meal.         . enalapril (VASOTEC) 10 MG tablet   Oral   Take 10 mg by mouth daily.         . enalapril (VASOTEC) 20 MG tablet   Oral   Take 20 mg by mouth daily.         Marland Kitchen glipiZIDE (GLUCOTROL) 10 MG tablet   Oral   Take 10 mg by mouth 2 (two) times daily before a meal.         . hydrochlorothiazide (HYDRODIURIL) 25 MG tablet   Oral   Take 25 mg by mouth daily.         . insulin detemir (LEVEMIR) 100 UNIT/ML injection   Subcutaneous   Inject 100 Units into the skin at bedtime.         Marland Kitchen levothyroxine (SYNTHROID, LEVOTHROID) 50 MCG tablet   Oral   Take 50 mcg by mouth daily before breakfast.         . lovastatin (MEVACOR) 10 MG tablet   Oral   Take 10 mg by mouth at bedtime.         . ondansetron (ZOFRAN) 4 MG tablet   Oral  Take 1 tablet by mouth every 8 (eight) hours as needed.      0   . oxyCODONE (OXY IR/ROXICODONE) 5 MG immediate release tablet   Oral   Take 5 mg by mouth every 6 (six) hours as needed. for pain      0   . potassium chloride (MICRO-K) 10 MEQ CR capsule   Oral   Take 10 mEq by mouth daily.         . sitaGLIPtin (JANUVIA) 50 MG tablet   Oral   Take 50 mg by mouth daily.         Marland Kitchen linagliptin (TRADJENTA) 5 MG TABS tablet   Oral   Take 5 mg by mouth daily.         . mirtazapine (REMERON) 15 MG tablet   Oral   Take 15 mg by mouth every evening.      0   . Oxycodone HCl 10 MG TABS   Oral   Take 10 mg by mouth every 12 (twelve) hours.           Allergies Review of patient's allergies indicates no known allergies.  History reviewed. No pertinent family history.  Social History History  Substance Use Topics  . Smoking status: Never Smoker   . Smokeless tobacco: Not on file  . Alcohol Use: No    Review of Systems  Constitutional: Negative for fever. Cardiovascular: Negative for chest pain. Respiratory:  Negative for shortness of breath. Gastrointestinal: Negative for abdominal pain, vomiting and diarrhea. Genitourinary: Negative for dysuria. Musculoskeletal: Negative for back pain. Skin: Negative for rash. Neurological: Negative for headaches, focal weakness or numbness.  10-point ROS otherwise negative.  ____________________________________________   PHYSICAL EXAM:  Vital signs reviewed Constitutional: Awake and alert. Not oriented to event, time. Eyes: Conjunctivae are normal. PERRL. Normal extraocular movements. ENT   Head: Normocephalic and atraumatic.   Nose: No congestion/rhinnorhea.   Mouth/Throat: Mucous membranes are moist.   Neck: No stridor. Hematological/Lymphatic/Immunilogical: No cervical lymphadenopathy. Cardiovascular: Normal rate, regular rhythm.  No murmurs, rubs, or gallops. Respiratory: Normal respiratory effort without tachypnea nor retractions. Mild diffuse bilateral wheezing Gastrointestinal: Distended, soft, not tympanitic. Nontender. Genitourinary: Deferred Musculoskeletal: Normal range of motion in all extremities. No joint effusions.  No lower extremity tenderness nor edema. Neurologic:  Normal speech and language. No gross focal neurologic deficits are appreciated. Speech is normal.  Skin:  Skin is warm, dry and intact. No rash noted. Psychiatric: Mood and affect are normal. Speech and behavior are normal. Patient exhibits appropriate insight and judgment.  ____________________________________________    LABS (pertinent positives/negatives)  Labs Reviewed  CBC WITH DIFFERENTIAL/PLATELET - Abnormal; Notable for the following:    RBC 3.67 (*)    Hemoglobin 9.3 (*)    HCT 29.1 (*)    MCV 79.3 (*)    MCH 25.3 (*)    MCHC 31.9 (*)    RDW 22.8 (*)    All other components within normal limits  COMPREHENSIVE METABOLIC PANEL - Abnormal; Notable for the following:    Calcium 7.4 (*)    Total Protein 6.0 (*)    Albumin 1.7 (*)    ALT 9  (*)    All other components within normal limits  LIPASE, BLOOD - Abnormal; Notable for the following:    Lipase 20 (*)    All other components within normal limits  GLUCOSE, CAPILLARY - Abnormal; Notable for the following:    Glucose-Capillary 103 (*)    All other components within  normal limits  GLUCOSE, CAPILLARY - Abnormal; Notable for the following:    Glucose-Capillary 110 (*)    All other components within normal limits  GLUCOSE, CAPILLARY  GLUCOSE, CAPILLARY  URINALYSIS COMPLETEWITH MICROSCOPIC ([facility])      ____________________________________________   EKG  I, Nance Pear, attending physician, personally viewed and interpreted this EKG  EKG Time: 0903 Rate: 52 Rhythm: Sinus bradycardia Axis: Left axis deviation Intervals: QTC 451 QRS: Left bundle branch block ST changes: No ST elevation equivalent    ____________________________________________    RADIOLOGY  Abdominal x-ray IMPRESSION: Bibasilar atelectasis.  No other focal abnormality is seen.  ____________________________________________   PROCEDURES  Procedure(s) performed: None  Critical Care performed: No  ____________________________________________   INITIAL IMPRESSION / ASSESSMENT AND PLAN / ED COURSE  Pertinent labs & imaging results that were available during my care of the patient were reviewed by me and considered in my medical decision making (see chart for details).  Patient presents to the emergency department today with certain for hypoglycemia and altered mental status. On physical exam patient is not completely oriented with a very distended abdomen. This is however nontender. Will start with basic blood work, every hour Accu-Cheks, abdominal x-ray.  ----------------------------------------- 2:17 PM on 07/06/2014 -----------------------------------------  Patient has now had 2 consecutive sugars over 100. Blood work without any obvious etiology for the hypoglycemia.  I have did have a discussion with both patient and wife about when to hold his nighttime insulin. I think likely the patient was so low this morning because he got his full dose of insulin last night when his sugars were around 70. This point patient awake, alert, no distress. I feel he is safe for discharge.  ____________________________________________   FINAL CLINICAL IMPRESSION(S) / ED DIAGNOSES  Final diagnoses:  Hypoglycemia     Nance Pear, MD 07/06/14 1418

## 2014-07-06 NOTE — ED Notes (Signed)
Pt here with c/o CBG 38 with AMS.  Given 1 amp D50 by EMS after IV started.  Pt currently awake but confused.  CBG on arrival 60.Marland Kitchen

## 2014-07-12 ENCOUNTER — Ambulatory Visit: Payer: Medicare Other

## 2014-07-13 ENCOUNTER — Inpatient Hospital Stay: Payer: Medicare Other

## 2014-07-13 ENCOUNTER — Inpatient Hospital Stay (HOSPITAL_BASED_OUTPATIENT_CLINIC_OR_DEPARTMENT_OTHER): Payer: Medicare Other | Admitting: Hematology and Oncology

## 2014-07-13 ENCOUNTER — Other Ambulatory Visit: Payer: Self-pay

## 2014-07-13 VITALS — BP 125/85 | HR 71

## 2014-07-13 VITALS — Temp 97.8°F

## 2014-07-13 DIAGNOSIS — D509 Iron deficiency anemia, unspecified: Secondary | ICD-10-CM

## 2014-07-13 DIAGNOSIS — C252 Malignant neoplasm of tail of pancreas: Secondary | ICD-10-CM

## 2014-07-13 DIAGNOSIS — E785 Hyperlipidemia, unspecified: Secondary | ICD-10-CM

## 2014-07-13 DIAGNOSIS — C801 Malignant (primary) neoplasm, unspecified: Secondary | ICD-10-CM | POA: Insufficient documentation

## 2014-07-13 DIAGNOSIS — R180 Malignant ascites: Secondary | ICD-10-CM | POA: Diagnosis not present

## 2014-07-13 DIAGNOSIS — R5381 Other malaise: Secondary | ICD-10-CM

## 2014-07-13 DIAGNOSIS — R531 Weakness: Secondary | ICD-10-CM

## 2014-07-13 DIAGNOSIS — D538 Other specified nutritional anemias: Secondary | ICD-10-CM

## 2014-07-13 DIAGNOSIS — Z79899 Other long term (current) drug therapy: Secondary | ICD-10-CM

## 2014-07-13 DIAGNOSIS — I10 Essential (primary) hypertension: Secondary | ICD-10-CM

## 2014-07-13 DIAGNOSIS — R600 Localized edema: Secondary | ICD-10-CM

## 2014-07-13 DIAGNOSIS — C787 Secondary malignant neoplasm of liver and intrahepatic bile duct: Secondary | ICD-10-CM | POA: Diagnosis not present

## 2014-07-13 DIAGNOSIS — Z5111 Encounter for antineoplastic chemotherapy: Secondary | ICD-10-CM | POA: Diagnosis not present

## 2014-07-13 DIAGNOSIS — R5383 Other fatigue: Secondary | ICD-10-CM

## 2014-07-13 LAB — COMPREHENSIVE METABOLIC PANEL
ALT: 9 U/L — ABNORMAL LOW (ref 17–63)
AST: 17 U/L (ref 15–41)
Albumin: 1.7 g/dL — ABNORMAL LOW (ref 3.5–5.0)
Alkaline Phosphatase: 66 U/L (ref 38–126)
Anion gap: 1 — ABNORMAL LOW (ref 5–15)
BUN: 9 mg/dL (ref 6–20)
CO2: 24 mmol/L (ref 22–32)
Calcium: 7.4 mg/dL — ABNORMAL LOW (ref 8.9–10.3)
Chloride: 109 mmol/L (ref 101–111)
Creatinine, Ser: 0.7 mg/dL (ref 0.61–1.24)
GFR calc Af Amer: >60 mL/min (ref >60)
GFR calc non Af Amer: >60 mL/min (ref >60)
Glucose, Bld: 175 mg/dL — ABNORMAL HIGH (ref 65–99)
Potassium: 3.7 mmol/L (ref 3.5–5.1)
Sodium: 134 mmol/L — ABNORMAL LOW (ref 135–145)
Total Bilirubin: 0.4 mg/dL (ref 0.3–1.2)
Total Protein: 5.7 g/dL — ABNORMAL LOW (ref 6.5–8.1)

## 2014-07-13 LAB — CBC WITH DIFFERENTIAL/PLATELET
Basophils Absolute: 0.1 10*3/uL (ref 0–0.1)
Basophils Relative: 1 %
Eosinophils Absolute: 0.2 10*3/uL (ref 0–0.7)
Eosinophils Relative: 3 %
HCT: 27.3 % — ABNORMAL LOW (ref 40.0–52.0)
Hemoglobin: 8.7 g/dL — ABNORMAL LOW (ref 13.0–18.0)
Lymphocytes Relative: 16 %
Lymphs Abs: 1.1 10*3/uL (ref 1.0–3.6)
MCH: 25.3 pg — ABNORMAL LOW (ref 26.0–34.0)
MCHC: 32 g/dL (ref 32.0–36.0)
MCV: 79.2 fL — ABNORMAL LOW (ref 80.0–100.0)
Monocytes Absolute: 0.7 10*3/uL (ref 0.2–1.0)
Monocytes Relative: 10 %
Neutro Abs: 4.6 10*3/uL (ref 1.4–6.5)
Neutrophils Relative %: 70 %
Platelets: 317 10*3/uL (ref 150–440)
RBC: 3.45 MIL/uL — ABNORMAL LOW (ref 4.40–5.90)
RDW: 23.2 % — ABNORMAL HIGH (ref 11.5–14.5)
WBC: 6.6 10*3/uL (ref 3.8–10.6)

## 2014-07-13 MED ORDER — SODIUM CHLORIDE 0.9 % IV SOLN
Freq: Once | INTRAVENOUS | Status: AC
  Administered 2014-07-13: 11:00:00 via INTRAVENOUS
  Filled 2014-07-13: qty 250

## 2014-07-13 MED ORDER — PACLITAXEL PROTEIN-BOUND CHEMO INJECTION 100 MG
100.0000 mg/m2 | Freq: Once | INTRAVENOUS | Status: AC
  Administered 2014-07-13: 175 mg via INTRAVENOUS
  Filled 2014-07-13: qty 35

## 2014-07-13 MED ORDER — SODIUM CHLORIDE 0.9 % IV SOLN
800.0000 mg/m2 | Freq: Once | INTRAVENOUS | Status: AC
  Administered 2014-07-13: 1368 mg via INTRAVENOUS
  Filled 2014-07-13: qty 30.72

## 2014-07-13 MED ORDER — SODIUM CHLORIDE 0.9 % IV SOLN
Freq: Once | INTRAVENOUS | Status: AC
  Administered 2014-07-13: 12:00:00 via INTRAVENOUS
  Filled 2014-07-13: qty 4

## 2014-07-13 MED ORDER — HEPARIN SOD (PORK) LOCK FLUSH 100 UNIT/ML IV SOLN
500.0000 [IU] | Freq: Once | INTRAVENOUS | Status: AC | PRN
  Administered 2014-07-13: 500 [IU]
  Filled 2014-07-13: qty 5

## 2014-07-13 MED ORDER — SODIUM CHLORIDE 0.9 % IJ SOLN
10.0000 mL | INTRAMUSCULAR | Status: DC | PRN
  Administered 2014-07-13: 10 mL
  Filled 2014-07-13: qty 10

## 2014-07-14 ENCOUNTER — Ambulatory Visit
Admission: RE | Admit: 2014-07-14 | Discharge: 2014-07-14 | Disposition: A | Discharge status: Home / Self Care | Payer: Medicare Other | Source: Ambulatory Visit | Attending: Hematology and Oncology | Admitting: Hematology and Oncology

## 2014-07-14 ENCOUNTER — Telehealth: Payer: Self-pay | Admitting: *Deleted

## 2014-07-14 DIAGNOSIS — Z79899 Other long term (current) drug therapy: Secondary | ICD-10-CM | POA: Insufficient documentation

## 2014-07-14 DIAGNOSIS — Z7982 Long term (current) use of aspirin: Secondary | ICD-10-CM | POA: Insufficient documentation

## 2014-07-14 DIAGNOSIS — E785 Hyperlipidemia, unspecified: Secondary | ICD-10-CM | POA: Insufficient documentation

## 2014-07-14 DIAGNOSIS — R600 Localized edema: Secondary | ICD-10-CM | POA: Diagnosis not present

## 2014-07-14 DIAGNOSIS — D538 Other specified nutritional anemias: Secondary | ICD-10-CM | POA: Insufficient documentation

## 2014-07-14 DIAGNOSIS — C259 Malignant neoplasm of pancreas, unspecified: Secondary | ICD-10-CM | POA: Diagnosis present

## 2014-07-14 DIAGNOSIS — R5383 Other fatigue: Secondary | ICD-10-CM | POA: Insufficient documentation

## 2014-07-14 DIAGNOSIS — C787 Secondary malignant neoplasm of liver and intrahepatic bile duct: Secondary | ICD-10-CM | POA: Insufficient documentation

## 2014-07-14 DIAGNOSIS — R5381 Other malaise: Secondary | ICD-10-CM | POA: Insufficient documentation

## 2014-07-14 DIAGNOSIS — D509 Iron deficiency anemia, unspecified: Secondary | ICD-10-CM | POA: Insufficient documentation

## 2014-07-14 DIAGNOSIS — I10 Essential (primary) hypertension: Secondary | ICD-10-CM | POA: Insufficient documentation

## 2014-07-14 DIAGNOSIS — R531 Weakness: Secondary | ICD-10-CM | POA: Insufficient documentation

## 2014-07-14 DIAGNOSIS — R180 Malignant ascites: Secondary | ICD-10-CM | POA: Insufficient documentation

## 2014-07-14 NOTE — Telephone Encounter (Signed)
Patient's wife asking for something for swelling in legs.  Per MD - swelling is due to elevated albumin levels and can only be corrected by mobility, elevating legs and nutrition.

## 2014-07-24 ENCOUNTER — Ambulatory Visit
Admission: RE | Admit: 2014-07-24 | Discharge: 2014-07-24 | Disposition: A | Discharge status: Home / Self Care | Payer: Medicare Other | Source: Ambulatory Visit | Attending: Hematology and Oncology | Admitting: Hematology and Oncology

## 2014-07-24 DIAGNOSIS — R180 Malignant ascites: Secondary | ICD-10-CM | POA: Insufficient documentation

## 2014-07-24 DIAGNOSIS — K769 Liver disease, unspecified: Secondary | ICD-10-CM | POA: Diagnosis not present

## 2014-07-24 DIAGNOSIS — C252 Malignant neoplasm of tail of pancreas: Secondary | ICD-10-CM | POA: Diagnosis present

## 2014-07-24 MED ORDER — HEPARIN SOD (PORK) LOCK FLUSH 100 UNIT/ML IV SOLN
500.0000 [IU] | Freq: Once | INTRAVENOUS | Status: AC
  Administered 2014-07-24: 500 [IU] via INTRAVENOUS

## 2014-07-24 MED ORDER — IOHEXOL 300 MG/ML  SOLN
100.0000 mL | Freq: Once | INTRAMUSCULAR | Status: AC | PRN
  Administered 2014-07-24: 100 mL via INTRAVENOUS

## 2014-07-27 ENCOUNTER — Inpatient Hospital Stay (HOSPITAL_BASED_OUTPATIENT_CLINIC_OR_DEPARTMENT_OTHER): Payer: Medicare Other | Admitting: Hematology and Oncology

## 2014-07-27 ENCOUNTER — Other Ambulatory Visit: Payer: Self-pay

## 2014-07-27 ENCOUNTER — Telehealth: Payer: Self-pay | Admitting: *Deleted

## 2014-07-27 ENCOUNTER — Inpatient Hospital Stay: Payer: Medicare Other | Attending: Internal Medicine

## 2014-07-27 ENCOUNTER — Other Ambulatory Visit: Payer: Medicare Other

## 2014-07-27 ENCOUNTER — Inpatient Hospital Stay: Payer: Medicare Other

## 2014-07-27 VITALS — BP 141/89 | HR 71 | Temp 97.9°F | Ht 67.0 in

## 2014-07-27 DIAGNOSIS — G893 Neoplasm related pain (acute) (chronic): Secondary | ICD-10-CM | POA: Diagnosis not present

## 2014-07-27 DIAGNOSIS — Z79899 Other long term (current) drug therapy: Secondary | ICD-10-CM | POA: Insufficient documentation

## 2014-07-27 DIAGNOSIS — E785 Hyperlipidemia, unspecified: Secondary | ICD-10-CM | POA: Diagnosis not present

## 2014-07-27 DIAGNOSIS — R600 Localized edema: Secondary | ICD-10-CM

## 2014-07-27 DIAGNOSIS — R530 Neoplastic (malignant) related fatigue: Secondary | ICD-10-CM | POA: Insufficient documentation

## 2014-07-27 DIAGNOSIS — I10 Essential (primary) hypertension: Secondary | ICD-10-CM | POA: Diagnosis not present

## 2014-07-27 DIAGNOSIS — E538 Deficiency of other specified B group vitamins: Secondary | ICD-10-CM

## 2014-07-27 DIAGNOSIS — Z5111 Encounter for antineoplastic chemotherapy: Secondary | ICD-10-CM | POA: Diagnosis present

## 2014-07-27 DIAGNOSIS — E119 Type 2 diabetes mellitus without complications: Secondary | ICD-10-CM | POA: Insufficient documentation

## 2014-07-27 DIAGNOSIS — C252 Malignant neoplasm of tail of pancreas: Secondary | ICD-10-CM

## 2014-07-27 DIAGNOSIS — E876 Hypokalemia: Secondary | ICD-10-CM | POA: Insufficient documentation

## 2014-07-27 DIAGNOSIS — R188 Other ascites: Secondary | ICD-10-CM | POA: Insufficient documentation

## 2014-07-27 DIAGNOSIS — D509 Iron deficiency anemia, unspecified: Secondary | ICD-10-CM

## 2014-07-27 DIAGNOSIS — C801 Malignant (primary) neoplasm, unspecified: Secondary | ICD-10-CM

## 2014-07-27 LAB — CBC WITH DIFFERENTIAL/PLATELET
Basophils Absolute: 0 10*3/uL (ref 0–0.1)
Basophils Relative: 1 %
Eosinophils Absolute: 0.1 10*3/uL (ref 0–0.7)
Eosinophils Relative: 2 %
HCT: 26 % — ABNORMAL LOW (ref 40.0–52.0)
Hemoglobin: 8.3 g/dL — ABNORMAL LOW (ref 13.0–18.0)
Lymphocytes Relative: 19 %
Lymphs Abs: 0.9 10*3/uL — ABNORMAL LOW (ref 1.0–3.6)
MCH: 25.6 pg — ABNORMAL LOW (ref 26.0–34.0)
MCHC: 31.8 g/dL — ABNORMAL LOW (ref 32.0–36.0)
MCV: 80.5 fL (ref 80.0–100.0)
Monocytes Absolute: 0.7 10*3/uL (ref 0.2–1.0)
Monocytes Relative: 13 %
Neutro Abs: 3.2 10*3/uL (ref 1.4–6.5)
Neutrophils Relative %: 65 %
Platelets: 351 10*3/uL (ref 150–440)
RBC: 3.23 MIL/uL — ABNORMAL LOW (ref 4.40–5.90)
RDW: 22.8 % — ABNORMAL HIGH (ref 11.5–14.5)
WBC: 4.9 10*3/uL (ref 3.8–10.6)

## 2014-07-27 LAB — COMPREHENSIVE METABOLIC PANEL
ALT: 9 U/L — ABNORMAL LOW (ref 17–63)
AST: 19 U/L (ref 15–41)
Albumin: 1.7 g/dL — ABNORMAL LOW (ref 3.5–5.0)
Alkaline Phosphatase: 64 U/L (ref 38–126)
Anion gap: 1 — ABNORMAL LOW (ref 5–15)
BUN: 9 mg/dL (ref 6–20)
CO2: 24 mmol/L (ref 22–32)
Calcium: 7.2 mg/dL — ABNORMAL LOW (ref 8.9–10.3)
Chloride: 109 mmol/L (ref 101–111)
Creatinine, Ser: 0.67 mg/dL (ref 0.61–1.24)
GFR calc Af Amer: >60 mL/min (ref >60)
GFR calc non Af Amer: >60 mL/min (ref >60)
Glucose, Bld: 143 mg/dL — ABNORMAL HIGH (ref 65–99)
Potassium: 3.3 mmol/L — ABNORMAL LOW (ref 3.5–5.1)
Sodium: 134 mmol/L — ABNORMAL LOW (ref 135–145)
Total Bilirubin: 0.4 mg/dL (ref 0.3–1.2)
Total Protein: 5.8 g/dL — ABNORMAL LOW (ref 6.5–8.1)

## 2014-07-27 LAB — MAGNESIUM: Magnesium: 1.6 mg/dL — ABNORMAL LOW (ref 1.7–2.4)

## 2014-07-27 MED ORDER — GEMCITABINE HCL CHEMO INJECTION 1 GM/26.3ML
800.0000 mg/m2 | Freq: Once | INTRAVENOUS | Status: AC
  Administered 2014-07-27: 1368 mg via INTRAVENOUS
  Filled 2014-07-27: qty 30.72

## 2014-07-27 MED ORDER — HEPARIN SOD (PORK) LOCK FLUSH 100 UNIT/ML IV SOLN
500.0000 [IU] | Freq: Once | INTRAVENOUS | Status: AC | PRN
  Administered 2014-07-27: 500 [IU]

## 2014-07-27 MED ORDER — FUROSEMIDE 20 MG PO TABS: 20.0000 mg | ORAL_TABLET | Freq: Every day | ORAL | Status: DC

## 2014-07-27 MED ORDER — POTASSIUM CHLORIDE CRYS ER 20 MEQ PO TBCR: 20.0000 meq | EXTENDED_RELEASE_TABLET | Freq: Every day | ORAL | Status: DC

## 2014-07-27 MED ORDER — SODIUM CHLORIDE 0.9 % IV SOLN
Freq: Once | INTRAVENOUS | Status: AC
  Administered 2014-07-27: 12:00:00 via INTRAVENOUS
  Filled 2014-07-27: qty 1000

## 2014-07-27 MED ORDER — SODIUM CHLORIDE 0.9 % IV SOLN
Freq: Once | INTRAVENOUS | Status: AC
  Administered 2014-07-27: 12:00:00 via INTRAVENOUS
  Filled 2014-07-27: qty 4

## 2014-07-27 MED ORDER — POTASSIUM CHLORIDE CRYS ER 20 MEQ PO TBCR
20.0000 meq | EXTENDED_RELEASE_TABLET | Freq: Once | ORAL | Status: DC
Start: 2014-07-27 — End: 2014-07-27

## 2014-07-27 MED ORDER — PACLITAXEL PROTEIN-BOUND CHEMO INJECTION 100 MG
100.0000 mg/m2 | Freq: Once | INTRAVENOUS | Status: AC
  Administered 2014-07-27: 175 mg via INTRAVENOUS
  Filled 2014-07-27: qty 35

## 2014-07-27 MED ORDER — SODIUM CHLORIDE 0.9 % IJ SOLN
10.0000 mL | INTRAMUSCULAR | Status: DC | PRN
  Administered 2014-07-27: 10 mL
  Filled 2014-07-27: qty 10

## 2014-07-27 MED ORDER — CYANOCOBALAMIN 1000 MCG/ML IJ SOLN
1000.0000 ug | Freq: Once | INTRAMUSCULAR | Status: AC
  Administered 2014-07-27: 1000 ug via INTRAMUSCULAR
  Filled 2014-07-27: qty 1

## 2014-07-27 NOTE — Progress Notes (Signed)
Pt here today for follow up regarding pancreatic cancer and treatment today; c/o swelling in lower extremities

## 2014-07-27 NOTE — Telephone Encounter (Signed)
V.O. Dr. Mike Gip- Please instruct pt to stop taking Lasix; will escribe a prescription for potassium to his pharmacy... Maudie Mercury stated that she would inform pt of this change; and that pt would like his prescription sent to [facility]. Pharmacy information updated.Marland KitchenMarland Kitchen

## 2014-07-28 ENCOUNTER — Other Ambulatory Visit (HOSPITAL_COMMUNITY): Payer: Self-pay | Admitting: Radiology

## 2014-07-28 ENCOUNTER — Other Ambulatory Visit: Payer: Self-pay | Admitting: Radiology

## 2014-07-28 LAB — CANCER ANTIGEN 19-9: CA 19-9: 157 U/mL — ABNORMAL HIGH (ref 0–35)

## 2014-07-31 ENCOUNTER — Ambulatory Visit
Admission: RE | Admit: 2014-07-31 | Discharge: 2014-07-31 | Disposition: A | Discharge status: Home / Self Care | Payer: Medicare Other | Source: Ambulatory Visit | Attending: Hematology and Oncology | Admitting: Hematology and Oncology

## 2014-07-31 DIAGNOSIS — R180 Malignant ascites: Secondary | ICD-10-CM | POA: Insufficient documentation

## 2014-07-31 DIAGNOSIS — C801 Malignant (primary) neoplasm, unspecified: Secondary | ICD-10-CM | POA: Insufficient documentation

## 2014-07-31 DIAGNOSIS — R140 Abdominal distension (gaseous): Secondary | ICD-10-CM | POA: Insufficient documentation

## 2014-07-31 DIAGNOSIS — R188 Other ascites: Secondary | ICD-10-CM

## 2014-07-31 NOTE — Discharge Instructions (Signed)
Paracentesis °Paracentesis is a procedure used to remove excess fluid from the belly (abdomen). Excess fluid in the belly is called ascites. Excess fluid can be the result of certain conditions, such as infection, inflammation, abdominal injury, heart failure, chronic scarring of the liver (cirrhosis), or cancer. The excess fluid is removed using a needle inserted through the skin and tissue into the abdomen.  °A paracentesis may be done to: °· Determine the cause of the excess fluid through examination of the fluid. °· Relieve symptoms of shortness of breath or pain caused by the excess fluid. °· Determine presence of bleeding after an abdominal injury. °LET YOUR CAREGIVERS KNOW ABOUT: °· Allergies. °· Medications taken including herbs, eye drops, over-the-counter medications, and creams. °· Use of steroids (by mouth or creams). °· Previous problems with anesthetics or numbing medicine. °· Possibility of pregnancy, if this applies. °· History of blood clots (thrombophlebitis). °· History of bleeding or blood problems. °· Previous surgery. °· Other health problems. °RISKS AND COMPLICATIONS °· Injury to an abdominal organ, such as the bowel (large intestine), liver, spleen, or bladder. °· Possible infection. °· Bleeding. °· Low blood pressure (hypotension). °BEFORE THE PROCEDURE °This is a procedure that can be done as an outpatient. Confirm the time that you need to arrive for your procedure. A blood sample may be done to determine your blood clotting time. The presence of a severe bleeding disorder (coagulopathy) which cannot be promptly corrected may make this procedure inadvisable. You may be asked to urinate. °PROCEDURE °The procedure will take about 30 minutes. This time will vary depending on the amount of fluid that is removed. You may be asked to lie on your back with your head elevated. An area on your abdomen will be cleansed. A numbing medicine may then be injected (local anesthesia) into the skin and  tissue. A needle is inserted through your abdominal skin and tissues until it is positioned in your abdomen. You may feel pressure or slight pain as the needle is positioned into the abdomen. Fluid is removed from the abdomen through the needle. Tell your caregiver if you feel dizzy or lightheaded. The needle is withdrawn once the desired amount of fluid has been removed. A sample of the fluid may be sent for examination.  °AFTER THE PROCEDURE °Your recovery will be assessed and monitored. If there are no problems, as an outpatient, you should be able to go home shortly after the procedure. There may be a very limited amount of clear fluid draining from the needle insertion site over the next 2 days. Confirm with your caregiver as to the expected amount of drainage. °Obtaining the Test Results °It is your responsibility to obtain your test results. Do not assume everything is normal if you have not heard from your caregiver or the medical facility. It is important for you to follow up on all of your test results. °HOME CARE INSTRUCTIONS  °· You may resume normal diet and activities as directed or allowed. °· Only take over-the-counter or prescription medicines for pain, discomfort, or fever as directed by your caregiver. °SEEK IMMEDIATE MEDICAL CARE IF: °· You develop shortness of breath or chest pain. °· You develop increasing pain, discomfort, or swelling in your abdomen. °· You develop new drainage or pus coming from site where fluid was removed. °· You develop swelling or increased redness from site where fluid was removed. °· You develop an unexplained temperature of 102° F (38.9° C) or above. °Document Released: 08/19/2004 Document Revised: 04/28/2011 Document   Reviewed: 09/25/2008 °[employer]® Patient Information ©2015 [employer]. This information is not intended to replace advice given to you by your health care provider. Make sure you discuss any questions you have with your health care provider. ° °

## 2014-07-31 NOTE — Procedures (Addendum)
Paracentesis   Complications:  None  Blood Loss: none  See dictation in canopy pacs

## 2014-08-03 ENCOUNTER — Inpatient Hospital Stay: Payer: Medicare Other

## 2014-08-03 ENCOUNTER — Telehealth: Payer: Self-pay

## 2014-08-03 VITALS — BP 118/73 | HR 65 | Temp 96.4°F | Resp 16

## 2014-08-03 DIAGNOSIS — E876 Hypokalemia: Secondary | ICD-10-CM

## 2014-08-03 DIAGNOSIS — T451X5A Adverse effect of antineoplastic and immunosuppressive drugs, initial encounter: Secondary | ICD-10-CM

## 2014-08-03 DIAGNOSIS — R188 Other ascites: Secondary | ICD-10-CM

## 2014-08-03 DIAGNOSIS — D6481 Anemia due to antineoplastic chemotherapy: Secondary | ICD-10-CM

## 2014-08-03 DIAGNOSIS — C801 Malignant (primary) neoplasm, unspecified: Secondary | ICD-10-CM

## 2014-08-03 DIAGNOSIS — C252 Malignant neoplasm of tail of pancreas: Secondary | ICD-10-CM

## 2014-08-03 LAB — BASIC METABOLIC PANEL
Anion gap: 3 — ABNORMAL LOW (ref 5–15)
BUN: <5 mg/dL — ABNORMAL LOW (ref 6–20)
CO2: 29 mmol/L (ref 22–32)
Calcium: 7.3 mg/dL — ABNORMAL LOW (ref 8.9–10.3)
Chloride: 105 mmol/L (ref 101–111)
Creatinine, Ser: 0.59 mg/dL — ABNORMAL LOW (ref 0.61–1.24)
GFR calc Af Amer: >60 mL/min (ref >60)
GFR calc non Af Amer: >60 mL/min (ref >60)
Glucose, Bld: 160 mg/dL — ABNORMAL HIGH (ref 65–99)
Potassium: 2.9 mmol/L — CL (ref 3.5–5.1)
Sodium: 137 mmol/L (ref 135–145)

## 2014-08-03 LAB — CBC WITH DIFFERENTIAL/PLATELET
Basophils Absolute: 0 10*3/uL (ref 0–0.1)
Basophils Relative: 0 %
Eosinophils Absolute: 0 10*3/uL (ref 0–0.7)
Eosinophils Relative: 1 %
HCT: 25 % — ABNORMAL LOW (ref 40.0–52.0)
Hemoglobin: 7.8 g/dL — ABNORMAL LOW (ref 13.0–18.0)
Lymphocytes Relative: 26 %
Lymphs Abs: 1.1 10*3/uL (ref 1.0–3.6)
MCH: 25.2 pg — ABNORMAL LOW (ref 26.0–34.0)
MCHC: 31.2 g/dL — ABNORMAL LOW (ref 32.0–36.0)
MCV: 80.8 fL (ref 80.0–100.0)
Monocytes Absolute: 0.5 10*3/uL (ref 0.2–1.0)
Monocytes Relative: 12 %
Neutro Abs: 2.6 10*3/uL (ref 1.4–6.5)
Neutrophils Relative %: 61 %
Platelets: 278 10*3/uL (ref 150–440)
RBC: 3.09 MIL/uL — ABNORMAL LOW (ref 4.40–5.90)
RDW: 21.9 % — ABNORMAL HIGH (ref 11.5–14.5)
WBC: 4.3 10*3/uL (ref 3.8–10.6)

## 2014-08-03 LAB — PROTIME-INR
INR: 1.82
Prothrombin Time: 21.2 seconds — ABNORMAL HIGH (ref 11.4–15.0)

## 2014-08-03 LAB — ABO/RH: ABO/RH(D): A POS

## 2014-08-03 LAB — PREPARE RBC (CROSSMATCH)

## 2014-08-03 MED ORDER — POTASSIUM CHLORIDE CRYS ER 20 MEQ PO TBCR: 20.0000 meq | EXTENDED_RELEASE_TABLET | Freq: Once | ORAL | Status: DC

## 2014-08-03 MED ORDER — SODIUM CHLORIDE 0.9 % IJ SOLN
10.0000 mL | INTRAMUSCULAR | Status: DC | PRN
  Administered 2014-08-03: 10 mL
  Filled 2014-08-03: qty 10

## 2014-08-03 MED ORDER — HEPARIN SOD (PORK) LOCK FLUSH 100 UNIT/ML IV SOLN
500.0000 [IU] | Freq: Once | INTRAVENOUS | Status: AC | PRN
  Administered 2014-08-03: 500 [IU]
  Filled 2014-08-03: qty 5

## 2014-08-03 MED ORDER — POTASSIUM CHLORIDE ER 10 MEQ PO TBCR
20.0000 meq | EXTENDED_RELEASE_TABLET | Freq: Every day | ORAL | Status: DC
  Administered 2014-08-03: 20 meq via ORAL

## 2014-08-03 MED ORDER — SODIUM CHLORIDE 0.9 % IV SOLN
Freq: Once | INTRAVENOUS | Status: AC
  Administered 2014-08-03: 15:00:00 via INTRAVENOUS
  Filled 2014-08-03: qty 1000

## 2014-08-03 MED ORDER — PACLITAXEL PROTEIN-BOUND CHEMO INJECTION 100 MG
100.0000 mg/m2 | Freq: Once | INTRAVENOUS | Status: AC
Start: 2014-08-03 — End: 2014-08-03
  Administered 2014-08-03: 175 mg via INTRAVENOUS
  Filled 2014-08-03: qty 35

## 2014-08-03 MED ORDER — SODIUM CHLORIDE 0.9 % IV SOLN
800.0000 mg/m2 | Freq: Once | INTRAVENOUS | Status: AC
  Administered 2014-08-03: 1368 mg via INTRAVENOUS
  Filled 2014-08-03: qty 30.72

## 2014-08-03 MED ORDER — SODIUM CHLORIDE 0.9 % IV SOLN
Freq: Once | INTRAVENOUS | Status: AC
  Administered 2014-08-03: 16:00:00 via INTRAVENOUS
  Filled 2014-08-03: qty 4

## 2014-08-03 NOTE — Telephone Encounter (Signed)
Received call from Lu in lab that pts potassium was 2.9; Dr. Mike Gip notified

## 2014-08-04 ENCOUNTER — Other Ambulatory Visit: Payer: Medicare Other

## 2014-08-04 ENCOUNTER — Inpatient Hospital Stay: Payer: Medicare Other

## 2014-08-04 ENCOUNTER — Ambulatory Visit: Payer: Medicare Other

## 2014-08-04 VITALS — BP 115/77 | Temp 95.6°F | Resp 18

## 2014-08-04 VITALS — BP 125/77 | HR 67 | Temp 96.0°F | Resp 18

## 2014-08-04 DIAGNOSIS — C252 Malignant neoplasm of tail of pancreas: Secondary | ICD-10-CM | POA: Diagnosis not present

## 2014-08-04 DIAGNOSIS — T451X5A Adverse effect of antineoplastic and immunosuppressive drugs, initial encounter: Principal | ICD-10-CM

## 2014-08-04 DIAGNOSIS — E876 Hypokalemia: Secondary | ICD-10-CM

## 2014-08-04 DIAGNOSIS — C801 Malignant (primary) neoplasm, unspecified: Secondary | ICD-10-CM

## 2014-08-04 DIAGNOSIS — D6481 Anemia due to antineoplastic chemotherapy: Secondary | ICD-10-CM

## 2014-08-04 LAB — POTASSIUM: Potassium: 3.6 mmol/L (ref 3.5–5.1)

## 2014-08-04 MED ORDER — HEPARIN SOD (PORK) LOCK FLUSH 100 UNIT/ML IV SOLN
INTRAVENOUS | Status: AC
  Filled 2014-08-04: qty 5

## 2014-08-04 MED ORDER — HEPARIN SOD (PORK) LOCK FLUSH 100 UNIT/ML IV SOLN
500.0000 [IU] | Freq: Once | INTRAVENOUS | Status: AC
  Administered 2014-08-04: 500 [IU] via INTRAVENOUS

## 2014-08-04 MED ORDER — DIPHENHYDRAMINE HCL 25 MG PO CAPS
25.0000 mg | ORAL_CAPSULE | Freq: Once | ORAL | Status: AC
  Administered 2014-08-04: 25 mg via ORAL
  Filled 2014-08-04: qty 1

## 2014-08-04 MED ORDER — ACETAMINOPHEN 325 MG PO TABS
650.0000 mg | ORAL_TABLET | Freq: Once | ORAL | Status: AC
  Administered 2014-08-04: 650 mg via ORAL
  Filled 2014-08-04: qty 2

## 2014-08-04 NOTE — Progress Notes (Signed)
After 15 minutes of blood transfusing rate changed to 230.  Computer would not let me document this in appropriate column.  Also after completion of blood transfusion computer would not let me put in rate of 0 or volume remaining of 0.  Patient completed transfusion with no reaction.

## 2014-08-05 LAB — TYPE AND SCREEN
ABO/RH(D): A POS
Antibody Screen: NEGATIVE
Unit division: 0

## 2014-08-10 ENCOUNTER — Inpatient Hospital Stay: Payer: Medicare Other

## 2014-08-10 DIAGNOSIS — C252 Malignant neoplasm of tail of pancreas: Secondary | ICD-10-CM

## 2014-08-10 DIAGNOSIS — C801 Malignant (primary) neoplasm, unspecified: Secondary | ICD-10-CM

## 2014-08-10 DIAGNOSIS — E876 Hypokalemia: Secondary | ICD-10-CM

## 2014-08-10 LAB — CBC WITH DIFFERENTIAL/PLATELET
Basophils Absolute: 0 10*3/uL (ref 0–0.1)
Basophils Relative: 0 %
Eosinophils Absolute: 0.1 10*3/uL (ref 0–0.7)
Eosinophils Relative: 2 %
HCT: 27.4 % — ABNORMAL LOW (ref 40.0–52.0)
Hemoglobin: 8.7 g/dL — ABNORMAL LOW (ref 13.0–18.0)
Lymphocytes Relative: 24 %
Lymphs Abs: 1 10*3/uL (ref 1.0–3.6)
MCH: 26.2 pg (ref 26.0–34.0)
MCHC: 31.8 g/dL — ABNORMAL LOW (ref 32.0–36.0)
MCV: 82.5 fL (ref 80.0–100.0)
Monocytes Absolute: 0.3 10*3/uL (ref 0.2–1.0)
Monocytes Relative: 9 %
Neutro Abs: 2.5 10*3/uL (ref 1.4–6.5)
Neutrophils Relative %: 65 %
Platelets: 136 10*3/uL — ABNORMAL LOW (ref 150–440)
RBC: 3.33 MIL/uL — ABNORMAL LOW (ref 4.40–5.90)
RDW: 20.3 % — ABNORMAL HIGH (ref 11.5–14.5)
WBC: 3.9 10*3/uL (ref 3.8–10.6)

## 2014-08-10 LAB — BASIC METABOLIC PANEL
Anion gap: 4 — ABNORMAL LOW (ref 5–15)
BUN: 7 mg/dL (ref 6–20)
CO2: 29 mmol/L (ref 22–32)
Calcium: 7 mg/dL — ABNORMAL LOW (ref 8.9–10.3)
Chloride: 104 mmol/L (ref 101–111)
Creatinine, Ser: 0.6 mg/dL — ABNORMAL LOW (ref 0.61–1.24)
GFR calc Af Amer: >60 mL/min (ref >60)
GFR calc non Af Amer: >60 mL/min (ref >60)
Glucose, Bld: 158 mg/dL — ABNORMAL HIGH (ref 65–99)
Potassium: 2.8 mmol/L — CL (ref 3.5–5.1)
Sodium: 137 mmol/L (ref 135–145)

## 2014-08-10 LAB — MAGNESIUM: Magnesium: 1.5 mg/dL — ABNORMAL LOW (ref 1.7–2.4)

## 2014-08-10 MED ORDER — SODIUM CHLORIDE 0.9 % IJ SOLN
10.0000 mL | INTRAMUSCULAR | Status: DC | PRN
  Administered 2014-08-10: 10 mL
  Filled 2014-08-10: qty 10

## 2014-08-10 MED ORDER — HEPARIN SOD (PORK) LOCK FLUSH 100 UNIT/ML IV SOLN
500.0000 [IU] | Freq: Once | INTRAVENOUS | Status: AC | PRN
  Administered 2014-08-10: 500 [IU]
  Filled 2014-08-10: qty 5

## 2014-08-10 MED ORDER — SODIUM CHLORIDE 0.9 % IV SOLN
INTRAVENOUS | Status: AC
  Administered 2014-08-10: 16:00:00 via INTRAVENOUS
  Filled 2014-08-10: qty 100

## 2014-08-10 MED ORDER — SODIUM CHLORIDE 0.9 % IV SOLN: 2.0000 g | Freq: Once | INTRAVENOUS | Status: DC

## 2014-08-10 MED ORDER — POTASSIUM CHLORIDE 20 MEQ/100ML IV SOLN: 20.0000 meq | Freq: Once | INTRAVENOUS | Status: DC

## 2014-08-11 ENCOUNTER — Inpatient Hospital Stay: Payer: Medicare Other

## 2014-08-11 VITALS — BP 129/80 | HR 67 | Temp 96.6°F | Resp 18

## 2014-08-11 DIAGNOSIS — C801 Malignant (primary) neoplasm, unspecified: Secondary | ICD-10-CM

## 2014-08-11 DIAGNOSIS — E876 Hypokalemia: Secondary | ICD-10-CM

## 2014-08-11 DIAGNOSIS — C252 Malignant neoplasm of tail of pancreas: Secondary | ICD-10-CM | POA: Diagnosis not present

## 2014-08-11 LAB — BASIC METABOLIC PANEL
Anion gap: 2 — ABNORMAL LOW (ref 5–15)
BUN: 8 mg/dL (ref 6–20)
CO2: 27 mmol/L (ref 22–32)
Calcium: 7.1 mg/dL — ABNORMAL LOW (ref 8.9–10.3)
Chloride: 103 mmol/L (ref 101–111)
Creatinine, Ser: 0.63 mg/dL (ref 0.61–1.24)
GFR calc Af Amer: >60 mL/min (ref >60)
GFR calc non Af Amer: >60 mL/min (ref >60)
Glucose, Bld: 191 mg/dL — ABNORMAL HIGH (ref 65–99)
Potassium: 3.2 mmol/L — ABNORMAL LOW (ref 3.5–5.1)
Sodium: 132 mmol/L — ABNORMAL LOW (ref 135–145)

## 2014-08-11 MED ORDER — SODIUM CHLORIDE 0.9 % IV SOLN
800.0000 mg/m2 | Freq: Once | INTRAVENOUS | Status: AC
  Administered 2014-08-11: 1368 mg via INTRAVENOUS
  Filled 2014-08-11: qty 30.72

## 2014-08-11 MED ORDER — PACLITAXEL PROTEIN-BOUND CHEMO INJECTION 100 MG
100.0000 mg/m2 | Freq: Once | INTRAVENOUS | Status: AC
  Administered 2014-08-11: 175 mg via INTRAVENOUS
  Filled 2014-08-11: qty 35

## 2014-08-11 MED ORDER — SODIUM CHLORIDE 0.9 % IV SOLN
Freq: Once | INTRAVENOUS | Status: AC
  Administered 2014-08-11: 10:00:00 via INTRAVENOUS
  Filled 2014-08-11: qty 4

## 2014-08-11 MED ORDER — SODIUM CHLORIDE 0.9 % IV SOLN
Freq: Once | INTRAVENOUS | Status: AC
  Administered 2014-08-11: 10:00:00 via INTRAVENOUS
  Filled 2014-08-11: qty 1000

## 2014-08-11 MED ORDER — HEPARIN SOD (PORK) LOCK FLUSH 100 UNIT/ML IV SOLN
500.0000 [IU] | Freq: Once | INTRAVENOUS | Status: AC
  Administered 2014-08-11: 500 [IU] via INTRAVENOUS
  Filled 2014-08-11: qty 5

## 2014-08-11 MED ORDER — SODIUM CHLORIDE 0.9 % IJ SOLN
10.0000 mL | INTRAMUSCULAR | Status: DC | PRN
  Administered 2014-08-11: 10 mL via INTRAVENOUS
  Filled 2014-08-11: qty 10

## 2014-08-15 ENCOUNTER — Other Ambulatory Visit: Payer: Self-pay

## 2014-08-15 MED ORDER — POTASSIUM CHLORIDE CRYS ER 20 MEQ PO TBCR: 20.0000 meq | EXTENDED_RELEASE_TABLET | Freq: Every day | ORAL | Status: DC

## 2014-08-17 ENCOUNTER — Inpatient Hospital Stay: Payer: Medicare Other

## 2014-08-17 ENCOUNTER — Other Ambulatory Visit: Payer: Self-pay | Admitting: *Deleted

## 2014-08-17 ENCOUNTER — Other Ambulatory Visit: Payer: Self-pay

## 2014-08-17 DIAGNOSIS — C252 Malignant neoplasm of tail of pancreas: Secondary | ICD-10-CM | POA: Diagnosis not present

## 2014-08-17 LAB — POTASSIUM: Potassium: 3.7 mmol/L (ref 3.5–5.1)

## 2014-08-17 MED ORDER — POTASSIUM CHLORIDE CRYS ER 20 MEQ PO TBCR: 20.0000 meq | EXTENDED_RELEASE_TABLET | Freq: Every day | ORAL | Status: DC

## 2014-08-17 NOTE — Telephone Encounter (Signed)
Med e scribed by md

## 2014-08-17 NOTE — Telephone Encounter (Signed)
  Potassium check this week.  M

## 2014-08-18 ENCOUNTER — Other Ambulatory Visit: Payer: Medicare Other

## 2014-08-21 ENCOUNTER — Encounter: Payer: Self-pay | Admitting: Hematology and Oncology

## 2014-08-21 NOTE — Progress Notes (Signed)
[facility] day:  07/27/2014  Chief Complaint: Harry Roman is a 69 y.o. male with metastatic pancreatic cancer who is seen for assessment on day 1 of cycle #4 gemcitabine and Abraxane.  HPI: The patient was last seen in the medical oncology clinic on 07/13/2014.  At that time, received day 15 chemotherapy.  Symptomatically, he was fatigued.  Most of his activities of daily living were managed by his wife.  He had significant lower extremity edema felt secondary to hypoalbuminemia and anemia.  Bilateral lower extremity duplex on 07/14/2014 revealed no DVT.  Per prior plan, he underwent abdominal and pelvic CT scan on 07/24/2014.  Imaging revealed slight interval decrease in the posterior left hepatic lesion and in the bulkiness of the omental disease.  The portal vein was attenuated, but patent.  There was increased ascites.  The patient notes genital swelling.  His ability to easily ambulate has decreased because of his abdominal and genital swelling.  He is eating more iron rich foods.  He is not taking oral iron.  Past Medical History  Diagnosis Date  . Hypertension   . Diabetes mellitus without complication   . Hyperlipidemia   . Cancer 2016    pancreas  . Ascites 2016  . 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 Hemorrhoid surgery    . Colonoscopy  Jan 2015    Dr Donnella Sham  . Peritoneal fluid aspiration  2016   No family history on file.  Social History:  reports that he has never smoked. He does not have any smokeless tobacco history on file. He reports that he does not drink alcohol or use illicit drugs.  The patient is accompanied by his wife.  Allergies: No Known Allergies  Current Medications: Current Outpatient Prescriptions  Medication Sig Dispense Refill  . amLODipine (NORVASC) 10 MG tablet Take 10 mg by mouth daily.    Marland Kitchen aspirin 81 MG tablet Take 81 mg by  mouth daily.    . carvedilol (COREG) 12.5 MG tablet Take 12.5 mg by mouth 2 (two) times daily with a meal.    . enalapril (VASOTEC) 10 MG tablet Take 10 mg by mouth daily.    . enalapril (VASOTEC) 20 MG tablet Take 20 mg by mouth daily.    . hydrochlorothiazide (HYDRODIURIL) 25 MG tablet Take 25 mg by mouth daily.    Marland Kitchen levothyroxine (SYNTHROID, LEVOTHROID) 50 MCG tablet Take 50 mcg by mouth daily before breakfast.    . linagliptin (TRADJENTA) 5 MG TABS tablet Take 5 mg by mouth daily.    Marland Kitchen lovastatin (MEVACOR) 10 MG tablet Take 10 mg by mouth at bedtime.    . mirtazapine (REMERON) 15 MG tablet Take 15 mg by mouth every evening.  0  . ondansetron (ZOFRAN) 4 MG tablet Take 1 tablet by mouth every 8 (eight) hours as needed.  0  . oxyCODONE (OXY IR/ROXICODONE) 5 MG immediate release tablet Take 5 mg by mouth every 6 (six) hours as needed. for pain  0  . Oxycodone HCl 10 MG TABS Take 10 mg by mouth every 12 (twelve) hours.    . sitaGLIPtin (JANUVIA) 50 MG tablet Take 50 mg by mouth daily.    . furosemide (LASIX) 20 MG tablet Take 1 tablet (20 mg total) by mouth daily. 30 tablet 0  . potassium chloride SA (K-DUR,KLOR-CON) 20 MEQ tablet Take 1 tablet (20 mEq total) by  mouth daily. 30 tablet 1   No current facility-administered medications for this visit.   Facility-Administered Medications Ordered in Other Visits  Medication Dose Route Frequency Provider Last Rate Last Dose  . sodium chloride 0.9 % 100 mL with potassium chloride 20 mEq, magnesium sulfate 2 g infusion   Intravenous Continuous Lequita Asal, MD   Stopped at 08/10/14 1655  . sodium chloride 0.9 % injection 10 mL  10 mL Intracatheter PRN Lequita Asal, MD   10 mL at 08/10/14 1410   Review of Systems:  GENERAL:  General fatigue. Less activity secondary to swelling.  No fevers, sweats or weight loss. PERFORMANCE STATUS (ECOG):  3 HEENT:  No visual changes, runny nose, sore throat, mouth sores or tenderness. Lungs: No  shortness of breath or cough.  No hemoptysis. Cardiac:  No chest pain, palpitations, orthopnea, or PND. GI:  Abdominal distention.  Eating small frequent meals.   Bowels controlled.  No nausea, vomiting, melena or hematochezia. GU:  Genital swelling.  No urgency, frequency, dysuria, or hematuria. Musculoskeletal:  No back pain.  No joint pain.  No muscle tenderness. Extremities:  Lower extremity edema. Skin:  No rashes or skin changes. Neuro:  General weakness.  No headache, focal numbness or weakness, balance or coordination issues. Endocrine:  No diabetes, thyroid issues, hot flashes or night sweats. Psych:  No mood changes, depression or anxiety. Pain:  Pain well controlled. Review of systems:  All other systems reviewed and found to be negative.  Physical Exam: Blood pressure 141/89, pulse 71, temperature 97.9 F (36.6 C), temperature source Tympanic, height _0  (1.702 m).  GENERAL:  Chronically fatigued elderly gentleman sitting in a wheelchair in the exam room in no acute distress. MENTAL STATUS:  Alert and oriented to person, place and time. HEAD:  Wearing a cap.  Alopecia.  Normocephalic, atraumatic, face symmetric, no Cushingoid features. EYES:  Brown eyes.  Pupils equal round and reactive to light and accomodation.  No conjunctivitis or scleral icterus. ENT:  Oropharynx clear without lesion.  Tongue normal. Mucous membranes moist.  RESPIRATORY:  Clear to auscultation without rales, wheezes or rhonchi. CARDIOVASCULAR:  Regular rate and rhythm without murmur, rub or gallop. ABDOMEN: Distended.  Shifting dullness.  Soft, non-tender, with active bowel sounds, and no appreciable hepatosplenomegaly. GENITOURINARY:  Bilateral testicular swelling. SKIN:  No rashes, ulcers or lesions. EXTREMITIES: Bilateral lower extremity pitting edema, increased.  No palpable cords. LYMPH NODES: No palpable cervical, supraclavicular, axillary or inguinal adenopathy  NEUROLOGICAL:  Unremarkable. PSYCH:  Appropriate.  Clinical Support on 07/27/2014  Component Date Value Ref Range Status  . Magnesium 07/27/2014 1.6* 1.7 - 2.4 mg/dL Final  . WBC 07/27/2014 4.9  3.8 - 10.6 K/uL Final   A-LINE DRAW  . RBC 07/27/2014 3.23* 4.40 - 5.90 MIL/uL Final  . Hemoglobin 07/27/2014 8.3* 13.0 - 18.0 g/dL Final  . HCT 07/27/2014 26.0* 40.0 - 52.0 % Final  . MCV 07/27/2014 80.5  80.0 - 100.0 fL Final  . MCH 07/27/2014 25.6* 26.0 - 34.0 pg Final  . MCHC 07/27/2014 31.8* 32.0 - 36.0 g/dL Final  . RDW 07/27/2014 22.8* 11.5 - 14.5 % Final  . Platelets 07/27/2014 351  150 - 440 K/uL Final  . Neutrophils Relative % 07/27/2014 65   Final  . Neutro Abs 07/27/2014 3.2  1.4 - 6.5 K/uL Final  . Lymphocytes Relative 07/27/2014 19   Final  . Lymphs Abs 07/27/2014 0.9* 1.0 - 3.6 K/uL Final  . Monocytes Relative 07/27/2014 13  Final  . Monocytes Absolute 07/27/2014 0.7  0.2 - 1.0 K/uL Final  . Eosinophils Relative 07/27/2014 2   Final  . Eosinophils Absolute 07/27/2014 0.1  0 - 0.7 K/uL Final  . Basophils Relative 07/27/2014 1   Final  . Basophils Absolute 07/27/2014 0.0  0 - 0.1 K/uL Final  . CA 19-9 07/27/2014 157* 0 - 35 U/mL Final   Comment: (NOTE) Roche ECLIA methodology Performed At: [facility] [place], [place] [id number] Lindon Romp MD [id number]   . Sodium 07/27/2014 134* 135 - 145 mmol/L Final  . Potassium 07/27/2014 3.3* 3.5 - 5.1 mmol/L Final  . Chloride 07/27/2014 109  101 - 111 mmol/L Final  . CO2 07/27/2014 24  22 - 32 mmol/L Final  . Glucose, Bld 07/27/2014 143* 65 - 99 mg/dL Final  . BUN 07/27/2014 9  6 - 20 mg/dL Final  . Creatinine, Ser 07/27/2014 0.67  0.61 - 1.24 mg/dL Final  . Calcium 07/27/2014 7.2* 8.9 - 10.3 mg/dL Final  . Total Protein 07/27/2014 5.8* 6.5 - 8.1 g/dL Final  . Albumin 07/27/2014 1.7* 3.5 - 5.0 g/dL Final  . AST 07/27/2014 19  15 - 41 U/L Final  . ALT 07/27/2014 9* 17 - 63 U/L Final  . Alkaline Phosphatase  07/27/2014 64  38 - 126 U/L Final  . Total Bilirubin 07/27/2014 0.4  0.3 - 1.2 mg/dL Final  . GFR calc non Af Amer 07/27/2014 >60  >60 mL/min Final  . GFR calc Af Amer 07/27/2014 >60  >60 mL/min Final   Comment: (NOTE) The eGFR has been calculated using the CKD EPI equation. This calculation has not been validated in all clinical situations. eGFR's persistently <60 mL/min signify possible Chronic Kidney Disease.   . Anion gap 07/27/2014 1* 5 - 15 Final    Assessment:  Harry Roman is a 69 y.o. African-American gentleman with metastatic pancreatic cancer.  He presented with unexplained weight loss 1 year, intermittent nausea vomiting 1 month, and a 1 week history of abdominal distention.  He lost 30-40 pounds in the past year.  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 Chest CT on 05/01/2014 revealed no evidence of metastatic disease.  Colonoscopy on 02/2013 was negative.  CA19-9 was 713 on 04/21/2014.  He underwent paracentesis x 2 (1.85 liters on 04/21/2014 and 2.9 liters on 05/01/2014).  Cytology was negative.  Omental biopsy on 04/21/2014 revealed metastatic adenocarcinoma consistent with pancreatic cancer (CA19-9 stains were positive).  He is B12 deficient (B12 was 34 on 04/21/2014).  He is on B12 supplimentation.  He has a microcytic anemia consistent with iron deficiency.  He is status post 3 cycles of gemcitabine and Abraxane (05/04/2014 - 06/29/2014).  Sister Wynona Dove nodule has resolved.  He last required a paracentesis on 05/01/2014.  CA19-9 has decreased from 1322 on 05/04/2014, 738 on 06/01/2014, 365 on 06/29/2014, and 157 today.  Abdominal and pelvic CT scan on 07/24/2014 revealed slight interval decrease in  the posterior left hepatic lesion and in the bulkiness of the omental disease.  The portal vein was attenuated, but patent.  There was increased ascites.  Symptomatically, he remains extremely weak.  He has increasing 3rd space fluid (ascites and lower extremity edema).  Plan: 1. Labs today- CBC with diff, CMP, CA19.9. 2. Day 1 of cycle #4 gemcitabine and Abraxane today. 3. Schedule therapeutic abdominal paracentesis (07/31/2014). Check PT/INR. 4. Begin potassium 20 meq a day.  First dose this afternoon, second dose in AM. 5. Begin Lasix 20 mg a day beginning tomorrow. 6. Do not take Lasix day of paracentesis. 7. RTC weekly x 2 for labs and gemcitabine and Abraxane. 8. RTC in 4 weeks for MD assessment, labs (CBC with diff, CMP, CA19-9) and cycle #5 gemcitabine and Abraxane.   Lequita Asal, MD

## 2014-08-21 NOTE — Progress Notes (Signed)
[facility] day:  07/13/2014   Chief Complaint: Harry Roman is a 69 y.o. male with metastatic pancreatic cancer who is seen for reassessment on day 15 of cycle #3 gemcitabine and Abraxane.  HPI: The patient was last seen in the medical oncology clinic on 06/29/2014.  At that time, began cycle #3.  Symptomatically, he was fatigued.  He was eating more.  He had diarrhea alternating with constipation.  Pain was well controlled.  Exam was stable.  CA19-9 had decreased from 1322 on 05/04/2014 to 365 on 06/29/2014.  He was seen in the emergency room. EMS was called secondary to low blood sugar. He has increasing weakness. He continues to require significant assistance with his activities of daily living. His wife is providing all of his care.  Because of his general debilitation, he requires a hospital bed to help with positioning and moving the patient.  He has had increasing lower extremity edema.  He denies any pleuritic chest pain or hemoptysis.  Past Medical History  Diagnosis Date  . Hypertension   . Diabetes mellitus without complication   . Hyperlipidemia   . Cancer 2016    pancreas  . Ascites 2016  . 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 Hemorrhoid surgery    . Colonoscopy  Jan 2015    Dr Donnella Sham  . Peritoneal fluid aspiration  2016    No family history on file.  Social History:  reports that he has never smoked. He does not have any smokeless tobacco history on file. He reports that he does not drink alcohol or use illicit drugs.  The patient is accompanied by his wife.  Allergies: No Known Allergies  Current Medications: Current Outpatient Prescriptions  Medication Sig Dispense Refill  . amLODipine (NORVASC) 10 MG tablet Take 10 mg by mouth daily.    Marland Kitchen aspirin 81 MG tablet Take 81 mg by mouth daily.    . carvedilol (COREG) 12.5 MG tablet Take 12.5 mg by mouth  2 (two) times daily with a meal.    . enalapril (VASOTEC) 10 MG tablet Take 10 mg by mouth daily.    . enalapril (VASOTEC) 20 MG tablet Take 20 mg by mouth daily.    . hydrochlorothiazide (HYDRODIURIL) 25 MG tablet Take 25 mg by mouth daily.    Marland Kitchen levothyroxine (SYNTHROID, LEVOTHROID) 50 MCG tablet Take 50 mcg by mouth daily before breakfast.    . linagliptin (TRADJENTA) 5 MG TABS tablet Take 5 mg by mouth daily.    Marland Kitchen lovastatin (MEVACOR) 10 MG tablet Take 10 mg by mouth at bedtime.    . mirtazapine (REMERON) 15 MG tablet Take 15 mg by mouth every evening.  0  . ondansetron (ZOFRAN) 4 MG tablet Take 1 tablet by mouth every 8 (eight) hours as needed.  0  . oxyCODONE (OXY IR/ROXICODONE) 5 MG immediate release tablet Take 5 mg by mouth every 6 (six) hours as needed. for pain  0  . Oxycodone HCl 10 MG TABS Take 10 mg by mouth every 12 (twelve) hours.    . sitaGLIPtin (JANUVIA) 50 MG tablet Take 50 mg by mouth daily.    . furosemide (LASIX) 20 MG tablet Take 1 tablet (20 mg total) by mouth daily. 30 tablet 0  . potassium chloride SA (K-DUR,KLOR-CON) 20 MEQ tablet Take 1 tablet (20 mEq total) by mouth daily. 30 tablet 1   No  current facility-administered medications for this visit.   Facility-Administered Medications Ordered in Other Visits  Medication Dose Route Frequency Provider Last Rate Last Dose  . sodium chloride 0.9 % 100 mL with potassium chloride 20 mEq, magnesium sulfate 2 g infusion   Intravenous Continuous Harry Asal, MD   Stopped at 08/10/14 1655  . sodium chloride 0.9 % injection 10 mL  10 mL Intracatheter PRN Harry Asal, MD   10 mL at 08/10/14 1410   Review of Systems:  GENERAL:  General fatigue.  Rests most of day.  No fevers, sweats or weight loss. PERFORMANCE STATUS (ECOG):  3 HEENT:  No visual changes, runny nose, sore throat, mouth sores or tenderness. Lungs: No shortness of breath or cough.  No hemoptysis. Cardiac:  No chest pain, palpitations, orthopnea, or  PND. GI:  Eating small frequent meals.   Bowels better controlled.  No nausea, vomiting, melena or hematochezia. GU:  No urgency, frequency, dysuria, or hematuria. Musculoskeletal:  No back pain.  No joint pain.  No muscle tenderness. Extremities:  Increasing lower extremity edema. Skin:  No rashes or skin changes. Neuro:  General weakness.  No headache, focal numbness or weakness, balance or coordination issues. Endocrine:  No diabetes, thyroid issues, hot flashes or night sweats. Psych:  No mood changes, depression or anxiety. Pain:  Pain well controlled. Review of systems:  All other systems reviewed and found to be negative.  Physical Exam: Temperature 97.8 F (36.6 C), temperature source Oral.  GENERAL:  Chronically fatigued elderly gentleman sitting in a wheelchair in the exam room in no acute distress. MENTAL STATUS:  Alert and oriented to person, place and time. HEAD:  Wearing a cap.  Alopecia.  Scruffy beard.  Normocephalic, atraumatic, face symmetric, no Cushingoid features. EYES:  Brown eyes.  Pupils equal round and reactive to light and accomodation.  No conjunctivitis or scleral icterus. ENT:  Oropharynx clear without lesion.  Tongue normal. Mucous membranes moist.  RESPIRATORY:  Clear to auscultation without rales, wheezes or rhonchi. CARDIOVASCULAR:  Regular rate and rhythm without murmur, rub or gallop. ABDOMEN: Distended.  Soft, non-tender, with active bowel sounds, and no hepatosplenomegaly. SKIN:  No rashes, ulcers or lesions. EXTREMITIES: Bilateral lower extremity pitting edema.  No palpable cords. LYMPH NODES: No palpable cervical, supraclavicular, axillary or inguinal adenopathy  NEUROLOGICAL: Unremarkable. PSYCH:  Appropriate.  Infusion on 07/13/2014  Component Date Value Ref Range Status  . Sodium 07/13/2014 134* 135 - 145 mmol/L Final  . Potassium 07/13/2014 3.7  3.5 - 5.1 mmol/L Final  . Chloride 07/13/2014 109  101 - 111 mmol/L Final  . CO2 07/13/2014 24   22 - 32 mmol/L Final  . Glucose, Bld 07/13/2014 175* 65 - 99 mg/dL Final  . BUN 07/13/2014 9  6 - 20 mg/dL Final  . Creatinine, Ser 07/13/2014 0.70  0.61 - 1.24 mg/dL Final  . Calcium 07/13/2014 7.4* 8.9 - 10.3 mg/dL Final  . Total Protein 07/13/2014 5.7* 6.5 - 8.1 g/dL Final  . Albumin 07/13/2014 1.7* 3.5 - 5.0 g/dL Final  . AST 07/13/2014 17  15 - 41 U/L Final  . ALT 07/13/2014 9* 17 - 63 U/L Final  . Alkaline Phosphatase 07/13/2014 66  38 - 126 U/L Final  . Total Bilirubin 07/13/2014 0.4  0.3 - 1.2 mg/dL Final  . GFR calc non Af Amer 07/13/2014 >60  >60 mL/min Final  . GFR calc Af Amer 07/13/2014 >60  >60 mL/min Final   Comment: (NOTE) The eGFR has  been calculated using the CKD EPI equation. This calculation has not been validated in all clinical situations. eGFR's persistently <60 mL/min signify possible Chronic Kidney Disease.   . Anion gap 07/13/2014 1* 5 - 15 Final  Clinical Support on 07/13/2014  Component Date Value Ref Range Status  . WBC 07/13/2014 6.6  3.8 - 10.6 K/uL Final   A-LINE DRAW  . RBC 07/13/2014 3.45* 4.40 - 5.90 MIL/uL Final  . Hemoglobin 07/13/2014 8.7* 13.0 - 18.0 g/dL Final  . HCT 07/13/2014 27.3* 40.0 - 52.0 % Final  . MCV 07/13/2014 79.2* 80.0 - 100.0 fL Final  . MCH 07/13/2014 25.3* 26.0 - 34.0 pg Final  . MCHC 07/13/2014 32.0  32.0 - 36.0 g/dL Final  . RDW 07/13/2014 23.2* 11.5 - 14.5 % Final  . Platelets 07/13/2014 317  150 - 440 K/uL Final  . Neutrophils Relative % 07/13/2014 70   Final  . Neutro Abs 07/13/2014 4.6  1.4 - 6.5 K/uL Final  . Lymphocytes Relative 07/13/2014 16   Final  . Lymphs Abs 07/13/2014 1.1  1.0 - 3.6 K/uL Final  . Monocytes Relative 07/13/2014 10   Final  . Monocytes Absolute 07/13/2014 0.7  0.2 - 1.0 K/uL Final  . Eosinophils Relative 07/13/2014 3   Final  . Eosinophils Absolute 07/13/2014 0.2  0 - 0.7 K/uL Final  . Basophils Relative 07/13/2014 1   Final  . Basophils Absolute 07/13/2014 0.1  0 - 0.1 K/uL Final     Assessment:  JERAD DUNLAP is a 69 y.o. African-American gentleman with metastatic pancreatic cancer.  He presented with unexplained weight loss 1 year, intermittent nausea vomiting 1 month, and a 1 week history of abdominal distention.  He lost 30-40 pounds in the past year.  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 Chest CT on 05/01/2014 revealed no evidence of metastatic disease.  Colonoscopy on 02/2013 was negative.  CA19-9 was 713 on 04/21/2014.  He underwent paracentesis x 2 (1.85 liters on 04/21/2014 and 2.9 liters on 05/01/2014).  Cytology was negative.  Omental biopsy on 04/21/2014 revealed metastatic adenocarcinoma consistent with pancreatic cancer (CA19-9 stains were positive).  He is B12 deficient (B12 was 34 on 04/21/2014).  He is on B12 supplimentation.  He has a microcytic anemia consistent with iron deficiency.  He is currently day 15 of cycle #3 gemcitabine and Abraxane (05/04/2014 - 06/29/2014).  Sister Harry Roman nodule has resolved.  He has not required a paracentesis since 03/14/206.  CA19-9 has decreased from 1322 on 05/04/2014, 738 on 06/01/2014, and 365 on 06/29/2014.  Symptomatically, he remains extremely weak.  He has had issues with hypoglycemia.  His albumen remains low (1.7) which is likely contributing to his edema.  He needs assistance with activities if daily living.  Pain is well controlled.  Exam reveals significant lower extremity edema.  He has a progressive microcytic anemia likely secondary to his diet, anemia of chronic disease, and chemotherapy induced anemia.   Plan: 1. Labs today- CBC with diff, CMP. 2. Day 15 of cycle #3 gemcitabine and Abraxane today. 3. Bilateral lower extremity  duplex. 4. Home health care consult. 5. Discuss anemia.  Discuss diet and oral iron.  Preauthorize Venofer.. 6. RTC after duplex study.   Melissa  Elmarie Mainland, MD

## 2014-08-24 ENCOUNTER — Telehealth: Payer: Self-pay

## 2014-08-24 ENCOUNTER — Other Ambulatory Visit: Payer: Self-pay

## 2014-08-24 ENCOUNTER — Inpatient Hospital Stay: Payer: Medicare Other

## 2014-08-24 ENCOUNTER — Inpatient Hospital Stay: Payer: Medicare Other | Attending: Hematology and Oncology

## 2014-08-24 ENCOUNTER — Inpatient Hospital Stay (HOSPITAL_BASED_OUTPATIENT_CLINIC_OR_DEPARTMENT_OTHER): Payer: Medicare Other | Admitting: Hematology and Oncology

## 2014-08-24 VITALS — BP 136/90 | HR 81

## 2014-08-24 DIAGNOSIS — R934 Abnormal findings on diagnostic imaging of urinary organs: Secondary | ICD-10-CM

## 2014-08-24 DIAGNOSIS — D509 Iron deficiency anemia, unspecified: Secondary | ICD-10-CM | POA: Insufficient documentation

## 2014-08-24 DIAGNOSIS — Z79899 Other long term (current) drug therapy: Secondary | ICD-10-CM | POA: Diagnosis not present

## 2014-08-24 DIAGNOSIS — C252 Malignant neoplasm of tail of pancreas: Secondary | ICD-10-CM

## 2014-08-24 DIAGNOSIS — R634 Abnormal weight loss: Secondary | ICD-10-CM | POA: Insufficient documentation

## 2014-08-24 DIAGNOSIS — C801 Malignant (primary) neoplasm, unspecified: Secondary | ICD-10-CM

## 2014-08-24 DIAGNOSIS — Z7982 Long term (current) use of aspirin: Secondary | ICD-10-CM | POA: Diagnosis not present

## 2014-08-24 DIAGNOSIS — E538 Deficiency of other specified B group vitamins: Secondary | ICD-10-CM | POA: Diagnosis not present

## 2014-08-24 DIAGNOSIS — E785 Hyperlipidemia, unspecified: Secondary | ICD-10-CM | POA: Diagnosis not present

## 2014-08-24 DIAGNOSIS — I10 Essential (primary) hypertension: Secondary | ICD-10-CM | POA: Diagnosis not present

## 2014-08-24 DIAGNOSIS — Z5111 Encounter for antineoplastic chemotherapy: Secondary | ICD-10-CM | POA: Diagnosis not present

## 2014-08-24 DIAGNOSIS — E876 Hypokalemia: Secondary | ICD-10-CM | POA: Insufficient documentation

## 2014-08-24 DIAGNOSIS — M6258 Muscle wasting and atrophy, not elsewhere classified, other site: Secondary | ICD-10-CM | POA: Diagnosis not present

## 2014-08-24 DIAGNOSIS — R5383 Other fatigue: Secondary | ICD-10-CM

## 2014-08-24 DIAGNOSIS — C787 Secondary malignant neoplasm of liver and intrahepatic bile duct: Secondary | ICD-10-CM

## 2014-08-24 DIAGNOSIS — Z794 Long term (current) use of insulin: Secondary | ICD-10-CM | POA: Diagnosis not present

## 2014-08-24 DIAGNOSIS — E119 Type 2 diabetes mellitus without complications: Secondary | ICD-10-CM | POA: Diagnosis not present

## 2014-08-24 LAB — CBC WITH DIFFERENTIAL/PLATELET
Basophils Absolute: 0 10*3/uL (ref 0–0.1)
Basophils Relative: 1 %
Eosinophils Absolute: 0.1 10*3/uL (ref 0–0.7)
Eosinophils Relative: 2 %
HCT: 27.9 % — ABNORMAL LOW (ref 40.0–52.0)
Hemoglobin: 8.8 g/dL — ABNORMAL LOW (ref 13.0–18.0)
Lymphocytes Relative: 13 %
Lymphs Abs: 1 10*3/uL (ref 1.0–3.6)
MCH: 26.2 pg (ref 26.0–34.0)
MCHC: 31.5 g/dL — ABNORMAL LOW (ref 32.0–36.0)
MCV: 83.1 fL (ref 80.0–100.0)
Monocytes Absolute: 0.9 10*3/uL (ref 0.2–1.0)
Monocytes Relative: 12 %
Neutro Abs: 5.7 10*3/uL (ref 1.4–6.5)
Neutrophils Relative %: 72 %
Platelets: 349 10*3/uL (ref 150–440)
RBC: 3.36 MIL/uL — ABNORMAL LOW (ref 4.40–5.90)
RDW: 20.6 % — ABNORMAL HIGH (ref 11.5–14.5)
WBC: 7.8 10*3/uL (ref 3.8–10.6)

## 2014-08-24 LAB — COMPREHENSIVE METABOLIC PANEL
ALT: 11 U/L — ABNORMAL LOW (ref 17–63)
AST: 20 U/L (ref 15–41)
Albumin: 1.8 g/dL — ABNORMAL LOW (ref 3.5–5.0)
Alkaline Phosphatase: 87 U/L (ref 38–126)
Anion gap: 4 — ABNORMAL LOW (ref 5–15)
BUN: 11 mg/dL (ref 6–20)
CO2: 25 mmol/L (ref 22–32)
Calcium: 7.1 mg/dL — ABNORMAL LOW (ref 8.9–10.3)
Chloride: 108 mmol/L (ref 101–111)
Creatinine, Ser: 0.71 mg/dL (ref 0.61–1.24)
GFR calc Af Amer: >60 mL/min (ref >60)
GFR calc non Af Amer: >60 mL/min (ref >60)
Glucose, Bld: 239 mg/dL — ABNORMAL HIGH (ref 65–99)
Potassium: 3.1 mmol/L — ABNORMAL LOW (ref 3.5–5.1)
Sodium: 137 mmol/L (ref 135–145)
Total Bilirubin: 0.4 mg/dL (ref 0.3–1.2)
Total Protein: 5.8 g/dL — ABNORMAL LOW (ref 6.5–8.1)

## 2014-08-24 LAB — MAGNESIUM: Magnesium: 1.5 mg/dL — ABNORMAL LOW (ref 1.7–2.4)

## 2014-08-24 MED ORDER — HEPARIN SOD (PORK) LOCK FLUSH 100 UNIT/ML IV SOLN
500.0000 [IU] | Freq: Once | INTRAVENOUS | Status: AC | PRN
  Administered 2014-08-24: 500 [IU]
  Filled 2014-08-24: qty 5

## 2014-08-24 MED ORDER — SODIUM CHLORIDE 0.9 % IV SOLN
Freq: Once | INTRAVENOUS | Status: AC
  Administered 2014-08-24: 13:00:00 via INTRAVENOUS
  Filled 2014-08-24: qty 4

## 2014-08-24 MED ORDER — SODIUM CHLORIDE 0.9 % IV SOLN
800.0000 mg/m2 | Freq: Once | INTRAVENOUS | Status: AC
  Administered 2014-08-24: 1368 mg via INTRAVENOUS
  Filled 2014-08-24: qty 9.68

## 2014-08-24 MED ORDER — MAGNESIUM SULFATE 50 % IJ SOLN
1.0000 g | Freq: Once | INTRAVENOUS | Status: AC
  Administered 2014-08-24: 1 g via INTRAVENOUS
  Filled 2014-08-24: qty 2

## 2014-08-24 MED ORDER — FUROSEMIDE 20 MG PO TABS: 20.0000 mg | ORAL_TABLET | Freq: Every day | ORAL | Status: DC

## 2014-08-24 MED ORDER — PACLITAXEL PROTEIN-BOUND CHEMO INJECTION 100 MG
100.0000 mg/m2 | Freq: Once | INTRAVENOUS | Status: AC
  Administered 2014-08-24: 175 mg via INTRAVENOUS
  Filled 2014-08-24: qty 35

## 2014-08-24 MED ORDER — SODIUM CHLORIDE 0.9 % IV SOLN
Freq: Once | INTRAVENOUS | Status: AC
  Administered 2014-08-24: 11:00:00 via INTRAVENOUS
  Filled 2014-08-24: qty 1000

## 2014-08-24 MED ORDER — POTASSIUM CHLORIDE CRYS ER 20 MEQ PO TBCR: 20.0000 meq | EXTENDED_RELEASE_TABLET | Freq: Every day | ORAL | Status: DC

## 2014-08-24 MED ORDER — SODIUM CHLORIDE 0.9 % IJ SOLN
10.0000 mL | INTRAMUSCULAR | Status: DC | PRN
  Administered 2014-08-24: 10 mL
  Filled 2014-08-24: qty 10

## 2014-08-24 NOTE — Progress Notes (Signed)
[facility] day:  08/24/2014  Chief Complaint: JAYEL INKS is a 69 y.o. male with metastatic pancreatic cancer who is seen for assessment on day 1 of cycle #5 gemcitabine and Abraxane.  HPI: The patient was last seen in the medical oncology clinic on 07/27/2014. At that time, abdominal and pelvic CT scan on 07/24/2014 revealed slight interval decrease in the posterior left hepatic lesion and in the bulkiness of the omental disease.  The portal vein was attenuated, but patent.  There was increased ascites.  CA19-9 had decreased to 157.  He began cycle #4.  He was weak due to 3rd space fluid (ascites and lower extremity edema). He underwent paracentesis on 07/31/2014.  Five liters were removed.  He was started on Lasix and potassium.  Symptomatically, he has improved significantly.  He has not needed any pain pills in over a month.  He has no edema.  He is getting around the house more.  He can wash himself, help with dressing, and use the restroom himself.  He has been eating a lot better for the past 2 weeks.  He describes eating cereal with berries, Boost, peanut butter with crackers and [facility]. He is also eating salmon with rice, black eyed peas, potato salad with hushpuppies, mixed fruit, and Mountain Dew.  Past Medical History  Diagnosis Date  . Hypertension   . Diabetes mellitus without complication   . Hyperlipidemia   . Cancer 2016    pancreas  . Ascites 2016  . 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 Hemorrhoid surgery    . Colonoscopy  Jan 2015    Dr Donnella Sham  . Peritoneal fluid aspiration  2016   No family history on file.  Social History:  reports that he has never smoked. He does not have any smokeless tobacco history on file. He reports that he does not drink alcohol or use illicit drugs.  The patient is accompanied by his wife.  Allergies: No Known  Allergies  Current Medications: Current Outpatient Prescriptions  Medication Sig Dispense Refill  . amLODipine (NORVASC) 10 MG tablet Take 10 mg by mouth daily.    Marland Kitchen aspirin 81 MG tablet Take 81 mg by mouth daily.    . carvedilol (COREG) 12.5 MG tablet Take 12.5 mg by mouth 2 (two) times daily with a meal.    . enalapril (VASOTEC) 20 MG tablet Take 20 mg by mouth daily.    Marland Kitchen levothyroxine (SYNTHROID, LEVOTHROID) 50 MCG tablet Take 50 mcg by mouth daily before breakfast.    . lovastatin (MEVACOR) 10 MG tablet Take 10 mg by mouth at bedtime.    . mirtazapine (REMERON) 15 MG tablet Take 15 mg by mouth every evening.  0  . furosemide (LASIX) 20 MG tablet Take 1 tablet (20 mg total) by mouth daily. 30 tablet 1  . insulin regular (NOVOLIN R,HUMULIN R) 100 units/mL injection Sliding Scale  Glucose 200-300 take 2 units Glucose 301-400 take 4 units  Glucose 401-500 take 6 units 10 mL 1  . potassium chloride SA (K-DUR,KLOR-CON) 10 MEQ tablet Take 1 tablet (10 mEq total) by mouth 3 (three) times daily. 40 tablet 1   No current facility-administered medications for this visit.   Facility-Administered Medications Ordered in Other Visits  Medication Dose Route Frequency Provider Last Rate Last Dose  . sodium chloride 0.9 % 100 mL with potassium chloride 20 mEq,  magnesium sulfate 2 g infusion   Intravenous Continuous Lequita Asal, MD   Stopped at 08/10/14 1655  . sodium chloride 0.9 % injection 10 mL  10 mL Intracatheter PRN Lequita Asal, MD   10 mL at 08/10/14 1410  . sodium chloride 0.9 % injection 10 mL  10 mL Intravenous PRN Lequita Asal, MD   10 mL at 08/31/14 8338   Review of Systems:  GENERAL:  Less fatigue. More active.  No fevers, sweats or weight loss. PERFORMANCE STATUS (ECOG):  3 HEENT:  No visual changes, runny nose, sore throat, mouth sores or tenderness. Lungs: No shortness of breath or cough.  No hemoptysis. Cardiac:  No chest pain, palpitations, orthopnea, or  PND. GI:  Resolution of abdominal distention.  Eating more.   No nausea, vomiting, diarrhea, constipation, melena or hematochezia. GU:  Genital swelling, resolved.  No urgency, frequency, dysuria, or hematuria. Musculoskeletal:  No back pain.  No joint pain.  No muscle tenderness. Extremities:  Lower extremity edema, resolved. Skin:  No rashes or skin changes. Neuro:  No headache, focal numbness or weakness, balance or coordination issues. Endocrine:  No diabetes, thyroid issues, hot flashes or night sweats. Psych:  No mood changes, depression or anxiety. Pain:  Pain well controlled. Review of systems:  All other systems reviewed and found to be negative.  Physical Exam: Blood pressure 120/81, pulse 83, temperature 96.7 F (35.9 C), temperature source Tympanic, resp. rate 16, weight 126 lb 1.7 oz (57.201 kg), SpO2 100 %.  GENERAL:  Chronically fatigued elderly gentleman sitting in a wheelchair in the exam room in no acute distress. MENTAL STATUS:  Alert and oriented to person, place and time. HEAD:  Wearing a black cap.  Alopecia.  Temporal wasting.  Normocephalic, atraumatic, face symmetric, no Cushingoid features. EYES:  Brown eyes.  Pupils equal round and reactive to light and accomodation.  No conjunctivitis or scleral icterus. ENT:  Oropharynx clear without lesion.  Tongue normal. Mucous membranes moist.  RESPIRATORY:  Clear to auscultation without rales, wheezes or rhonchi. CARDIOVASCULAR:  Regular rate and rhythm without murmur, rub or gallop. ABDOMEN: Soft, non-tender, with active bowel sounds, and no appreciable hepatosplenomegaly.  No appreciable ascites. SKIN:  No rashes, ulcers or lesions. EXTREMITIES: No lower extremity edema.  No palpable cords. LYMPH NODES: No palpable cervical, supraclavicular, axillary or inguinal adenopathy  NEUROLOGICAL: Unremarkable. PSYCH:  Appropriate.  Infusion on 08/24/2014  Component Date Value Ref Range Status  . Magnesium 08/24/2014 1.5* 1.7  - 2.4 mg/dL Final  . WBC 08/24/2014 7.8  3.8 - 10.6 K/uL Final   A-LINE DRAW  . RBC 08/24/2014 3.36* 4.40 - 5.90 MIL/uL Final  . Hemoglobin 08/24/2014 8.8* 13.0 - 18.0 g/dL Final  . HCT 08/24/2014 27.9* 40.0 - 52.0 % Final  . MCV 08/24/2014 83.1  80.0 - 100.0 fL Final  . MCH 08/24/2014 26.2  26.0 - 34.0 pg Final  . MCHC 08/24/2014 31.5* 32.0 - 36.0 g/dL Final  . RDW 08/24/2014 20.6* 11.5 - 14.5 % Final  . Platelets 08/24/2014 349  150 - 440 K/uL Final  . Neutrophils Relative % 08/24/2014 72   Final  . Neutro Abs 08/24/2014 5.7  1.4 - 6.5 K/uL Final  . Lymphocytes Relative 08/24/2014 13   Final  . Lymphs Abs 08/24/2014 1.0  1.0 - 3.6 K/uL Final  . Monocytes Relative 08/24/2014 12   Final  . Monocytes Absolute 08/24/2014 0.9  0.2 - 1.0 K/uL Final  . Eosinophils Relative 08/24/2014  2   Final  . Eosinophils Absolute 08/24/2014 0.1  0 - 0.7 K/uL Final  . Basophils Relative 08/24/2014 1   Final  . Basophils Absolute 08/24/2014 0.0  0 - 0.1 K/uL Final  . CA 19-9 08/24/2014 181* 0 - 35 U/mL Final   Comment: (NOTE) Roche ECLIA methodology Performed At: [facility] [place], [place] [id number] Lindon Romp MD WU:[id number]   Appointment on 08/24/2014  Component Date Value Ref Range Status  . Sodium 08/24/2014 137  135 - 145 mmol/L Final  . Potassium 08/24/2014 3.1* 3.5 - 5.1 mmol/L Final  . Chloride 08/24/2014 108  101 - 111 mmol/L Final  . CO2 08/24/2014 25  22 - 32 mmol/L Final  . Glucose, Bld 08/24/2014 239* 65 - 99 mg/dL Final  . BUN 08/24/2014 11  6 - 20 mg/dL Final  . Creatinine, Ser 08/24/2014 0.71  0.61 - 1.24 mg/dL Final  . Calcium 08/24/2014 7.1* 8.9 - 10.3 mg/dL Final  . Total Protein 08/24/2014 5.8* 6.5 - 8.1 g/dL Final  . Albumin 08/24/2014 1.8* 3.5 - 5.0 g/dL Final  . AST 08/24/2014 20  15 - 41 U/L Final  . ALT 08/24/2014 11* 17 - 63 U/L Final  . Alkaline Phosphatase 08/24/2014 87  38 - 126 U/L Final  . Total Bilirubin 08/24/2014 0.4  0.3 -  1.2 mg/dL Final  . GFR calc non Af Amer 08/24/2014 >60  >60 mL/min Final  . GFR calc Af Amer 08/24/2014 >60  >60 mL/min Final   Comment: (NOTE) The eGFR has been calculated using the CKD EPI equation. This calculation has not been validated in all clinical situations. eGFR's persistently <60 mL/min signify possible Chronic Kidney Disease.   . Anion gap 08/24/2014 4* 5 - 15 Final    Assessment:  Harry Roman is a 69 y.o. African-American gentleman with metastatic pancreatic cancer.  He presented with unexplained weight loss 1 year, intermittent nausea vomiting 1 month, and a 1 week history of abdominal distention.  He lost 30-40 pounds in the past year.  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 Chest CT on 05/01/2014 revealed no evidence of metastatic disease.  Colonoscopy on 02/2013 was negative.  CA19-9 was 713 on 04/21/2014.  He underwent paracentesis x 2 (1.85 liters on 04/21/2014 and 2.9 liters on 05/01/2014).  Cytology was negative.  Omental biopsy on 04/21/2014 revealed metastatic adenocarcinoma consistent with pancreatic cancer (CA19-9 stains were positive).  He is B12 deficient (B12 was 34 on 04/21/2014).  He is on B12 supplimentation.  He has a microcytic anemia consistent with iron deficiency.  He is status post 4 cycles of gemcitabine and Abraxane (05/04/2014 - 07/27/2014).  Sister Wynona Dove nodule has resolved.  He last required a paracentesis on 05/01/2014.  CA19-9 has decreased from 1322 on 05/04/2014, 738 on 06/01/2014, 365 on 06/29/2014, and 157 on 07/27/2014.  Abdominal and pelvic CT scan on 07/24/2014 revealed slight interval decrease in the posterior left hepatic lesion and in the bulkiness of the  omental disease.  The portal vein was attenuated, but patent.  There was increased ascites.  He underwent paracentesis on 07/31/2014.  5 liters were removed.  He was started on lasix and potassium.  Symptomatically, he has improved  over the past month.  He is eating better.  He is more active.  His edema has resolved on Lasix.  He has not used any pain medications in over a month.  His magnesium and potassium are low.  Plan: 1. Labs today- CBC with diff, CMP, CA19.9. 2. Day 1 of cycle #5 gemcitabine and Abraxane today. 3. Magnesium 1 gm IV today. 4. Increase oral potassium x 3 days then return to baseline supplimentation. 5. Discuss use of Lasix prn (no longer scheduled). 6. RTC weekly x 2 for labs and gemcitabine and Abraxane. 7. RTC in 4 weeks for MD assessment, labs (CBC with diff, CMP, CA19-9) and cycle #6 gemcitabine and Abraxane.   Lequita Asal, MD

## 2014-08-24 NOTE — Telephone Encounter (Signed)
Informed pt to increase potassium to 20 meq twice a day for 3 days then just once a day; Romie Minus, RN in infusion will also review this with the patient's wife when she returns; refills for Potassium and Lasix sent to pharmacy

## 2014-08-25 ENCOUNTER — Other Ambulatory Visit: Payer: Self-pay

## 2014-08-25 ENCOUNTER — Emergency Department
Admission: EM | Admit: 2014-08-25 | Discharge: 2014-08-25 | Disposition: A | Discharge status: Home / Self Care | Payer: Medicare Other | Attending: Emergency Medicine | Admitting: Emergency Medicine

## 2014-08-25 ENCOUNTER — Encounter: Payer: Self-pay | Admitting: Emergency Medicine

## 2014-08-25 DIAGNOSIS — C252 Malignant neoplasm of tail of pancreas: Secondary | ICD-10-CM | POA: Diagnosis not present

## 2014-08-25 DIAGNOSIS — I10 Essential (primary) hypertension: Secondary | ICD-10-CM | POA: Diagnosis not present

## 2014-08-25 DIAGNOSIS — Z7982 Long term (current) use of aspirin: Secondary | ICD-10-CM | POA: Diagnosis not present

## 2014-08-25 DIAGNOSIS — Z794 Long term (current) use of insulin: Secondary | ICD-10-CM | POA: Insufficient documentation

## 2014-08-25 DIAGNOSIS — Z79899 Other long term (current) drug therapy: Secondary | ICD-10-CM | POA: Insufficient documentation

## 2014-08-25 DIAGNOSIS — R739 Hyperglycemia, unspecified: Secondary | ICD-10-CM

## 2014-08-25 DIAGNOSIS — E1165 Type 2 diabetes mellitus with hyperglycemia: Secondary | ICD-10-CM | POA: Insufficient documentation

## 2014-08-25 LAB — COMPREHENSIVE METABOLIC PANEL
ALT: 13 U/L — ABNORMAL LOW (ref 17–63)
ANION GAP: 7 (ref 5–15)
AST: 30 U/L (ref 15–41)
Albumin: 1.6 g/dL — ABNORMAL LOW (ref 3.5–5.0)
Alkaline Phosphatase: 80 U/L (ref 38–126)
BUN: 14 mg/dL (ref 6–20)
CALCIUM: 7.3 mg/dL — AB (ref 8.9–10.3)
CO2: 23 mmol/L (ref 22–32)
Chloride: 106 mmol/L (ref 101–111)
Creatinine, Ser: 0.84 mg/dL (ref 0.61–1.24)
Glucose, Bld: 486 mg/dL — ABNORMAL HIGH (ref 65–99)
Potassium: 3.7 mmol/L (ref 3.5–5.1)
Sodium: 136 mmol/L (ref 135–145)
Total Bilirubin: 0.2 mg/dL — ABNORMAL LOW (ref 0.3–1.2)
Total Protein: 5.4 g/dL — ABNORMAL LOW (ref 6.5–8.1)

## 2014-08-25 LAB — CBC
HCT: 25.7 % — ABNORMAL LOW (ref 40.0–52.0)
Hemoglobin: 8.1 g/dL — ABNORMAL LOW (ref 13.0–18.0)
MCH: 26.4 pg (ref 26.0–34.0)
MCHC: 31.6 g/dL — ABNORMAL LOW (ref 32.0–36.0)
MCV: 83.6 fL (ref 80.0–100.0)
PLATELETS: 341 10*3/uL (ref 150–440)
RBC: 3.07 MIL/uL — ABNORMAL LOW (ref 4.40–5.90)
RDW: 20.2 % — AB (ref 11.5–14.5)
WBC: 9.3 10*3/uL (ref 3.8–10.6)

## 2014-08-25 LAB — URINALYSIS COMPLETE WITH MICROSCOPIC (ARMC ONLY)
BACTERIA UA: NONE SEEN
Bilirubin Urine: NEGATIVE
HGB URINE DIPSTICK: NEGATIVE
Ketones, ur: NEGATIVE mg/dL
Nitrite: NEGATIVE
PH: 6 (ref 5.0–8.0)
Protein, ur: NEGATIVE mg/dL
RBC / HPF: NONE SEEN RBC/hpf (ref 0–5)
SPECIFIC GRAVITY, URINE: 1.015 (ref 1.005–1.030)
SQUAMOUS EPITHELIAL / LPF: NONE SEEN

## 2014-08-25 LAB — CANCER ANTIGEN 19-9: CA 19-9: 181 U/mL — ABNORMAL HIGH (ref 0–35)

## 2014-08-25 LAB — GLUCOSE, CAPILLARY
GLUCOSE-CAPILLARY: 437 mg/dL — AB (ref 65–99)
GLUCOSE-CAPILLARY: 310 mg/dL — AB (ref 65–99)
Glucose-Capillary: 460 mg/dL — ABNORMAL HIGH (ref 65–99)

## 2014-08-25 LAB — TROPONIN I: TROPONIN I: 0.04 ng/mL — AB (ref <0.031)

## 2014-08-25 MED ORDER — SODIUM CHLORIDE 0.9 % IV SOLN
Freq: Once | INTRAVENOUS | Status: AC
  Administered 2014-08-25: 13:00:00 via INTRAVENOUS

## 2014-08-25 MED ORDER — INSULIN REGULAR HUMAN 100 UNIT/ML IJ SOLN: INTRAMUSCULAR | Status: DC

## 2014-08-25 MED ORDER — INSULIN ASPART 100 UNIT/ML ~~LOC~~ SOLN
SUBCUTANEOUS | Status: AC
  Administered 2014-08-25: 6 [IU] via SUBCUTANEOUS
  Filled 2014-08-25: qty 6

## 2014-08-25 MED ORDER — INSULIN ASPART 100 UNIT/ML ~~LOC~~ SOLN
6.0000 [IU] | Freq: Once | SUBCUTANEOUS | Status: AC
Start: 2014-08-25 — End: 2014-08-25
  Administered 2014-08-25: 6 [IU] via SUBCUTANEOUS

## 2014-08-25 MED ORDER — SODIUM CHLORIDE 0.9 % IV BOLUS (SEPSIS)
1000.0000 mL | Freq: Once | INTRAVENOUS | Status: AC
  Administered 2014-08-25: 1000 mL via INTRAVENOUS

## 2014-08-25 NOTE — ED Provider Notes (Signed)
Second troponin normal, all vitals normal, glucose is Below 300  Harry Drafts, MD 08/25/14 [phone]

## 2014-08-25 NOTE — ED Notes (Signed)
Pt currently being treated for pancreatic cancer.

## 2014-08-25 NOTE — ED Provider Notes (Signed)
[facility] Emergency Department Provider Note   ____________________________________________  Time seen: 2:45 PM I have reviewed the triage vital signs and the triage nursing note.  HISTORY  Chief Complaint Hyperglycemia   Historian Patient  HPI Harry Roman is a 69 y.o. male whose wife brought him in for sugars have been elevated into the 400and 500s. They called his primary care doctor at [facility] in they indicated he come to the ER for evaluation. The patienthad chemotherapy yesterday. He denies any fever, cough, shortness of breath, chest pain, or trouble breathing. He denies abdominal pain, nausea, vomiting, diarrhea. He was on insulin previously but because of low blood sugars and took him off of it. Recently due to elevated blood sugars. He was told to use sliding scale insulin, however the wife and the patient due to not having a sliding scale. They tried giving him "2 milligrams". When I asked about whether or not they may 2 units of insulin and they were unsure.    Past Medical History  Diagnosis Date  . Hypertension   . Diabetes mellitus without complication   . Hyperlipidemia   . Cancer 2016    pancreas  . Ascites 2016  . 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 Hypomagnesemia 08/24/2014  . Ascites 07/27/2014  . Hypokalemia 07/27/2014  . Cancer 07/13/2014  . Bilateral lower extremity edema 07/13/2014  . B12 deficiency 06/29/2014  . 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 Hypothyroidism 05/22/2014  . Hypertension 05/22/2014  . Hyperlipemia 05/22/2014  . Diabetes mellitus, type 2 05/22/2014    Past Surgical History  Procedure Laterality Date  . Hemorrhoid surgery    . Colonoscopy  Jan 2015    Dr Donnella Sham  . Peritoneal fluid aspiration  2016    Current Outpatient Rx  Name  Route  Sig  Dispense  Refill  . amLODipine (NORVASC) 10 MG tablet    Oral   Take 10 mg by mouth daily.         Marland Kitchen aspirin 81 MG tablet   Oral   Take 81 mg by mouth daily.         . carvedilol (COREG) 12.5 MG tablet   Oral   Take 12.5 mg by mouth 2 (two) times daily with a meal.         . enalapril (VASOTEC) 10 MG tablet   Oral   Take 10 mg by mouth daily.         . enalapril (VASOTEC) 20 MG tablet   Oral   Take 20 mg by mouth daily.         . furosemide (LASIX) 20 MG tablet   Oral   Take 1 tablet (20 mg total) by mouth daily.   30 tablet   0   . hydrochlorothiazide (HYDRODIURIL) 25 MG tablet   Oral   Take 25 mg by mouth daily.         . insulin regular (NOVOLIN R,HUMULIN R) 100 units/mL injection      Sliding Scale  Glucose 200-300 take 2 units Glucose 301-400 take 4 units  Glucose 401-500 take 6 units   10 mL   1   . levothyroxine (SYNTHROID, LEVOTHROID) 50 MCG tablet   Oral   Take 50 mcg by mouth daily before breakfast.         . linagliptin (TRADJENTA) 5 MG TABS tablet  Oral   Take 5 mg by mouth daily.         Marland Kitchen lovastatin (MEVACOR) 10 MG tablet   Oral   Take 10 mg by mouth at bedtime.         . mirtazapine (REMERON) 15 MG tablet   Oral   Take 15 mg by mouth every evening.      0   . ondansetron (ZOFRAN) 4 MG tablet   Oral   Take 1 tablet by mouth every 8 (eight) hours as needed.      0   . oxyCODONE (OXY IR/ROXICODONE) 5 MG immediate release tablet   Oral   Take 5 mg by mouth every 6 (six) hours as needed. for pain      0   . Oxycodone HCl 10 MG TABS   Oral   Take 10 mg by mouth every 12 (twelve) hours.         . potassium chloride SA (K-DUR,KLOR-CON) 20 MEQ tablet   Oral   Take 1 tablet (20 mEq total) by mouth daily.   30 tablet   1   . sitaGLIPtin (JANUVIA) 50 MG tablet   Oral   Take 50 mg by mouth daily.           Allergies Review of patient's allergies indicates no known allergies.  No family history on file.  Social History History  Substance Use Topics  .  Smoking status: Never Smoker   . Smokeless tobacco: Not on file  . Alcohol Use: No    Review of Systems  Constitutional: Negative for fever. Eyes: Negative for visual changes. ENT: Negative for sore throat. Cardiovascular: Negative for chest pain. Respiratory: Negative for shortness of breath. Gastrointestinal: Negative for abdominal pain, vomiting and diarrhea. Genitourinary: Negative for dysuria. Musculoskeletal: Negative for back pain. Skin: Negative for rash. Neurological: Negative for headaches, focal weakness or numbness. 10 point Review of Systems otherwise negative ____________________________________________   PHYSICAL EXAM:  VITAL SIGNS: ED Triage Vitals  Enc Vitals Group     BP 08/25/14 1248 107/63 mmHg     Pulse Rate 08/25/14 1248 78     Resp 08/25/14 1248 18     Temp 08/25/14 1248 98.4 F (36.9 C)     Temp Source 08/25/14 1248 Oral     SpO2 08/25/14 1248 98 %     Weight 08/25/14 1248 126 lb (57.153 kg)     Height 08/25/14 1248 5\' 7"  (1.702 m)     Head Cir --      Peak Flow --      Pain Score 08/25/14 1244 0     Pain Loc --      Pain Edu? --      Excl. in [place]? --      Constitutional: Alert and oriented. In no distress. Somewhat cachectic. Eyes: Conjunctivae are normal. PERRL. Normal extraocular movements. ENT   Head: Normocephalic and atraumatic.   Nose: No congestion/rhinnorhea.   Mouth/Throat: Mucous membranes are mildly dry.   Neck: No stridor. Cardiovascular/Chest: Normal rate, regular rhythm.  No murmurs, rubs, or gallops. Respiratory: Normal respiratory effort without tachypnea nor retractions. Breath sounds are clear and equal bilaterally. No wheezes/rales/rhonchi. Gastrointestinal: Soft. No distention, no guarding, no rebound. Nontender  Genitourinary/rectal:Deferred Musculoskeletal: Nontender with normal range of motion in all extremities. No joint effusions.  No lower extremity tenderness nor edema. Neurologic:  Normal speech  and language. No gross focal neurologic deficits are appreciated. Skin:  Skin is warm, dry and intact.  No rash noted. Psychiatric: Mood and affect are normal. Speech and behavior are normal. Patient exhibits appropriate insight and judgment.  ____________________________________________   EKG I, Lisa Roca, MD, the attending physician have personally viewed and interpreted all ECGs.  Undetermined rhythm. Left axis deviation. Left bundle branch block. Similar to previous EKG ____________________________________________  LABS (pertinent positives/negatives)  Urinalysis trace leukocytes, no ketones, greater than 500 glucose CBC showing white blood count of 9.3, hemoglobin 8.1 Metabolic panel showing glucose 486 with normal anion gap. Otherwise within normal limits Troponin 0.04 ____________________________________________  RADIOLOGY All Xrays were viewed by me. Imaging interpreted by Radiologist.  None __________________________________________  PROCEDURES  Procedure(s) performed: None Critical Care performed: None  ____________________________________________   ED COURSE / ASSESSMENT AND PLAN  CONSULTATIONS: None  Pertinent labs & imaging results that were available during my care of the patient were reviewed by me and considered in my medical decision making (see chart for details).   Patient with diabetes who has been off of insulin for quite some time due to control blood sugars however recently has been elevated as wife has started taking care of his blood sugars. His exam shows no specific cause of why he is Hyperglycemic, however his troponin was minimally elevated 0.04, I will repeat a every 3 hour level and it is not going up he can be discharged. From the standpoint of his hyperglycemia, he is not in DKA. I gave him a short-acting insulin and discussed with the family no more long-acting at this point in time, and he will have a prescription for regular insulin for  sliding scale. They're to follow-up at the [place] [facility] at the primary care doctor this next week.  Repeat troponin and repeat glucose check signed out to Dr. Corky Downs shift change at 3:45 PM  Patient / Family / Caregiver informed of clinical course, medical decision-making process, and agree with plan.   I discussed return precautions, follow-up instructions, and discharged instructions with patient and/or family.  ___________________________________________   FINAL CLINICAL IMPRESSION(S) / ED DIAGNOSES   Final diagnoses:  Hyperglycemia      Lisa Roca, MD 08/25/14 [phone]

## 2014-08-25 NOTE — ED Notes (Signed)
Repeat troponin sent to lab

## 2014-08-25 NOTE — ED Notes (Signed)
Pts wife states the Dr office said to bring him here because his blood glucose is elevated.

## 2014-08-25 NOTE — Discharge Instructions (Signed)
Stop the Levemir insulin until you discuss with your primary doctor at the next appointment. I'm writing a prescription for a short acting insulin called  Regular Insulin which you can take as directed:  Glucose 200-300 take 2 units Glucose 301-400 take 4 units  Glucose 401-500 take 6 units  Weight 2-3 hours before taking any additional insulin. Return to emergency department for any worsening condition including nausea, vomiting, chest pain, confusion or altered mental status, trouble breathing, fever, blood sugar over 500, or any other symptoms concerning to you   Hyperglycemia Hyperglycemia occurs when the glucose (sugar) in your blood is too high. Hyperglycemia can happen for many reasons, but it most often happens to people who do not know they have diabetes or are not managing their diabetes properly.  CAUSES  Whether you have diabetes or not, there are other causes of hyperglycemia. Hyperglycemia can occur when you have diabetes, but it can also occur in other situations that you might not be as aware of, such as: Diabetes  If you have diabetes and are having problems controlling your blood glucose, hyperglycemia could occur because of some of the following reasons:  Not following your meal plan.  Not taking your diabetes medications or not taking it properly.  Exercising less or doing less activity than you normally do.  Being sick. Pre-diabetes  This cannot be ignored. Before people develop Type 2 diabetes, they almost always have "pre-diabetes." This is when your blood glucose levels are higher than normal, but not yet high enough to be diagnosed as diabetes. Research has shown that some long-term damage to the body, especially the heart and circulatory system, may already be occurring during pre-diabetes. If you take action to manage your blood glucose when you have pre-diabetes, you may delay or prevent Type 2 diabetes from developing. Stress  If you have diabetes, you may  be "diet" controlled or on oral medications or insulin to control your diabetes. However, you may find that your blood glucose is higher than usual in the hospital whether you have diabetes or not. This is often referred to as "stress hyperglycemia." Stress can elevate your blood glucose. This happens because of hormones put out by the body during times of stress. If stress has been the cause of your high blood glucose, it can be followed regularly by your caregiver. That way he/she can make sure your hyperglycemia does not continue to get worse or progress to diabetes. Steroids  Steroids are medications that act on the infection fighting system (immune system) to block inflammation or infection. One side effect can be a rise in blood glucose. Most people can produce enough extra insulin to allow for this rise, but for those who cannot, steroids make blood glucose levels go even higher. It is not unusual for steroid treatments to "uncover" diabetes that is developing. It is not always possible to determine if the hyperglycemia will go away after the steroids are stopped. A special blood test called an A1c is sometimes done to determine if your blood glucose was elevated before the steroids were started. SYMPTOMS  Thirsty.  Frequent urination.  Dry mouth.  Blurred vision.  Tired or fatigue.  Weakness.  Sleepy.  Tingling in feet or leg. DIAGNOSIS  Diagnosis is made by monitoring blood glucose in one or all of the following ways:  A1c test. This is a chemical found in your blood.  Fingerstick blood glucose monitoring.  Laboratory results. TREATMENT  First, knowing the cause of the hyperglycemia is  important before the hyperglycemia can be treated. Treatment may include, but is not be limited to:  Education.  Change or adjustment in medications.  Change or adjustment in meal plan.  Treatment for an illness, infection, etc.  More frequent blood glucose monitoring.  Change in  exercise plan.  Decreasing or stopping steroids.  Lifestyle changes. HOME CARE INSTRUCTIONS   Test your blood glucose as directed.  Exercise regularly. Your caregiver will give you instructions about exercise. Pre-diabetes or diabetes which comes on with stress is helped by exercising.  Eat wholesome, balanced meals. Eat often and at regular, fixed times. Your caregiver or nutritionist will give you a meal plan to guide your sugar intake.  Being at an ideal weight is important. If needed, losing as little as 10 to 15 pounds may help improve blood glucose levels. SEEK MEDICAL CARE IF:   You have questions about medicine, activity, or diet.  You continue to have symptoms (problems such as increased thirst, urination, or weight gain). SEEK IMMEDIATE MEDICAL CARE IF:   You are vomiting or have diarrhea.  Your breath smells fruity.  You are breathing faster or slower.  You are very sleepy or incoherent.  You have numbness, tingling, or pain in your feet or hands.  You have chest pain.  Your symptoms get worse even though you have been following your caregiver's orders.  If you have any other questions or concerns. Document Released: 07/30/2000 Document Revised: 04/28/2011 Document Reviewed: 06/02/2011 [facility] Patient Information 2015 [place], [place]. This information is not intended to replace advice given to you by your health care provider. Make sure you discuss any questions you have with your health care provider.

## 2014-08-31 ENCOUNTER — Other Ambulatory Visit: Payer: Self-pay

## 2014-08-31 ENCOUNTER — Inpatient Hospital Stay: Payer: Medicare Other

## 2014-08-31 ENCOUNTER — Telehealth: Payer: Self-pay

## 2014-08-31 DIAGNOSIS — C252 Malignant neoplasm of tail of pancreas: Secondary | ICD-10-CM

## 2014-08-31 DIAGNOSIS — C801 Malignant (primary) neoplasm, unspecified: Secondary | ICD-10-CM

## 2014-08-31 DIAGNOSIS — E876 Hypokalemia: Secondary | ICD-10-CM

## 2014-08-31 LAB — CBC WITH DIFFERENTIAL/PLATELET
Basophils Absolute: 0 10*3/uL (ref 0–0.1)
Basophils Relative: 0 %
Eosinophils Absolute: 0 10*3/uL (ref 0–0.7)
Eosinophils Relative: 0 %
HCT: 25.5 % — ABNORMAL LOW (ref 40.0–52.0)
Hemoglobin: 8 g/dL — ABNORMAL LOW (ref 13.0–18.0)
Lymphocytes Relative: 7 %
Lymphs Abs: 0.6 10*3/uL — ABNORMAL LOW (ref 1.0–3.6)
MCH: 25.7 pg — ABNORMAL LOW (ref 26.0–34.0)
MCHC: 31.1 g/dL — ABNORMAL LOW (ref 32.0–36.0)
MCV: 82.6 fL (ref 80.0–100.0)
Monocytes Absolute: 0.8 10*3/uL (ref 0.2–1.0)
Monocytes Relative: 10 %
Neutro Abs: 7 10*3/uL — ABNORMAL HIGH (ref 1.4–6.5)
Neutrophils Relative %: 83 %
Platelets: 240 10*3/uL (ref 150–440)
RBC: 3.09 MIL/uL — ABNORMAL LOW (ref 4.40–5.90)
RDW: 20 % — ABNORMAL HIGH (ref 11.5–14.5)
WBC: 8.5 10*3/uL (ref 3.8–10.6)

## 2014-08-31 LAB — COMPREHENSIVE METABOLIC PANEL
ALT: 16 U/L — ABNORMAL LOW (ref 17–63)
AST: 21 U/L (ref 15–41)
Albumin: 1.7 g/dL — ABNORMAL LOW (ref 3.5–5.0)
Alkaline Phosphatase: 78 U/L (ref 38–126)
Anion gap: 4 — ABNORMAL LOW (ref 5–15)
BUN: 12 mg/dL (ref 6–20)
CO2: 28 mmol/L (ref 22–32)
Calcium: 7.7 mg/dL — ABNORMAL LOW (ref 8.9–10.3)
Chloride: 111 mmol/L (ref 101–111)
Creatinine, Ser: 0.84 mg/dL (ref 0.61–1.24)
GFR calc Af Amer: >60 mL/min (ref >60)
GFR calc non Af Amer: >60 mL/min (ref >60)
Glucose, Bld: 292 mg/dL — ABNORMAL HIGH (ref 65–99)
Potassium: 2.6 mmol/L — CL (ref 3.5–5.1)
Sodium: 143 mmol/L (ref 135–145)
Total Bilirubin: 0.4 mg/dL (ref 0.3–1.2)
Total Protein: 6 g/dL — ABNORMAL LOW (ref 6.5–8.1)

## 2014-08-31 LAB — PREPARE RBC (CROSSMATCH)

## 2014-08-31 MED ORDER — SODIUM CHLORIDE 0.9 % IV SOLN
20.0000 meq | Freq: Once | INTRAVENOUS | Status: AC
  Administered 2014-08-31: 20 meq via INTRAVENOUS
  Filled 2014-08-31: qty 250

## 2014-08-31 MED ORDER — SODIUM CHLORIDE 0.9 % IV SOLN
Freq: Once | INTRAVENOUS | Status: AC
  Administered 2014-08-31: 12:00:00 via INTRAVENOUS
  Filled 2014-08-31: qty 1000

## 2014-08-31 MED ORDER — SODIUM CHLORIDE 0.9 % IV SOLN
800.0000 mg/m2 | Freq: Once | INTRAVENOUS | Status: AC
  Administered 2014-08-31: 1368 mg via INTRAVENOUS
  Filled 2014-08-31: qty 30.72

## 2014-08-31 MED ORDER — POTASSIUM CHLORIDE 10 MEQ/100ML IV SOLN: 10.0000 meq | INTRAVENOUS | Status: DC

## 2014-08-31 MED ORDER — PACLITAXEL PROTEIN-BOUND CHEMO INJECTION 100 MG
100.0000 mg/m2 | Freq: Once | INTRAVENOUS | Status: AC
  Administered 2014-08-31: 175 mg via INTRAVENOUS
  Filled 2014-08-31: qty 35

## 2014-08-31 MED ORDER — FUROSEMIDE 20 MG PO TABS: 20.0000 mg | ORAL_TABLET | Freq: Every day | ORAL | Status: DC

## 2014-08-31 MED ORDER — SODIUM CHLORIDE 0.9 % IV SOLN
Freq: Once | INTRAVENOUS | Status: AC
  Administered 2014-08-31: 13:00:00 via INTRAVENOUS
  Filled 2014-08-31: qty 4

## 2014-08-31 MED ORDER — HEPARIN SOD (PORK) LOCK FLUSH 100 UNIT/ML IV SOLN
INTRAVENOUS | Status: AC
  Filled 2014-08-31: qty 5

## 2014-08-31 MED ORDER — SODIUM CHLORIDE 0.9 % IJ SOLN
10.0000 mL | INTRAMUSCULAR | Status: AC | PRN
  Administered 2014-08-31: 10 mL via INTRAVENOUS
  Filled 2014-08-31: qty 10

## 2014-08-31 NOTE — Telephone Encounter (Signed)
Lu from lab called with critical result of potassium of 2.6 Dr. Mike Gip notified; pt states he is taking his potassium twice a day

## 2014-09-01 ENCOUNTER — Inpatient Hospital Stay: Payer: Medicare Other

## 2014-09-01 VITALS — BP 105/62 | HR 69 | Temp 96.0°F | Resp 18

## 2014-09-01 DIAGNOSIS — C252 Malignant neoplasm of tail of pancreas: Secondary | ICD-10-CM

## 2014-09-01 MED ORDER — SODIUM CHLORIDE 0.9 % IV SOLN
250.0000 mL | Freq: Once | INTRAVENOUS | Status: AC
  Administered 2014-09-01: 250 mL via INTRAVENOUS
  Filled 2014-09-01: qty 250

## 2014-09-01 MED ORDER — SODIUM CHLORIDE 0.9 % IJ SOLN
10.0000 mL | INTRAMUSCULAR | Status: AC | PRN
  Administered 2014-09-01: 10 mL
  Filled 2014-09-01: qty 10

## 2014-09-01 MED ORDER — ACETAMINOPHEN 325 MG PO TABS
650.0000 mg | ORAL_TABLET | Freq: Once | ORAL | Status: AC
Start: 2014-09-01 — End: 2014-09-01
  Administered 2014-09-01: 650 mg via ORAL
  Filled 2014-09-01: qty 2

## 2014-09-01 MED ORDER — HEPARIN SOD (PORK) LOCK FLUSH 100 UNIT/ML IV SOLN
500.0000 [IU] | Freq: Every day | INTRAVENOUS | Status: AC | PRN
  Administered 2014-09-01: 500 [IU]
  Filled 2014-09-01: qty 5

## 2014-09-01 MED ORDER — DIPHENHYDRAMINE HCL 25 MG PO CAPS
25.0000 mg | ORAL_CAPSULE | Freq: Once | ORAL | Status: AC
  Administered 2014-09-01: 25 mg via ORAL
  Filled 2014-09-01: qty 1

## 2014-09-03 LAB — TYPE AND SCREEN
ABO/RH(D): A POS
Antibody Screen: NEGATIVE
Unit division: 0

## 2014-09-07 ENCOUNTER — Other Ambulatory Visit: Payer: Self-pay

## 2014-09-07 ENCOUNTER — Inpatient Hospital Stay: Payer: Medicare Other

## 2014-09-07 ENCOUNTER — Telehealth: Payer: Self-pay

## 2014-09-07 VITALS — BP 100/62 | HR 92 | Resp 20

## 2014-09-07 DIAGNOSIS — E876 Hypokalemia: Secondary | ICD-10-CM

## 2014-09-07 DIAGNOSIS — C801 Malignant (primary) neoplasm, unspecified: Secondary | ICD-10-CM

## 2014-09-07 DIAGNOSIS — C252 Malignant neoplasm of tail of pancreas: Secondary | ICD-10-CM

## 2014-09-07 LAB — CBC WITH DIFFERENTIAL/PLATELET
Basophils Absolute: 0 10*3/uL (ref 0–0.1)
Basophils Relative: 0 %
Eosinophils Absolute: 0.2 10*3/uL (ref 0–0.7)
Eosinophils Relative: 3 %
HCT: 30.3 % — ABNORMAL LOW (ref 40.0–52.0)
Hemoglobin: 9.5 g/dL — ABNORMAL LOW (ref 13.0–18.0)
Lymphocytes Relative: 12 %
Lymphs Abs: 0.7 10*3/uL — ABNORMAL LOW (ref 1.0–3.6)
MCH: 26.2 pg (ref 26.0–34.0)
MCHC: 31.2 g/dL — ABNORMAL LOW (ref 32.0–36.0)
MCV: 83.8 fL (ref 80.0–100.0)
Monocytes Absolute: 0.5 10*3/uL (ref 0.2–1.0)
Monocytes Relative: 10 %
Neutro Abs: 4.2 10*3/uL (ref 1.4–6.5)
Neutrophils Relative %: 75 %
Platelets: 167 10*3/uL (ref 150–440)
RBC: 3.62 MIL/uL — ABNORMAL LOW (ref 4.40–5.90)
RDW: 19.5 % — ABNORMAL HIGH (ref 11.5–14.5)
WBC: 5.6 10*3/uL (ref 3.8–10.6)

## 2014-09-07 LAB — COMPREHENSIVE METABOLIC PANEL
ALT: 13 U/L — ABNORMAL LOW (ref 17–63)
AST: 20 U/L (ref 15–41)
Albumin: 2 g/dL — ABNORMAL LOW (ref 3.5–5.0)
Alkaline Phosphatase: 75 U/L (ref 38–126)
Anion gap: 5 (ref 5–15)
BUN: 8 mg/dL (ref 6–20)
CO2: 27 mmol/L (ref 22–32)
Calcium: 7.7 mg/dL — ABNORMAL LOW (ref 8.9–10.3)
Chloride: 113 mmol/L — ABNORMAL HIGH (ref 101–111)
Creatinine, Ser: 0.88 mg/dL (ref 0.61–1.24)
GFR calc Af Amer: >60 mL/min (ref >60)
GFR calc non Af Amer: >60 mL/min (ref >60)
Glucose, Bld: 217 mg/dL — ABNORMAL HIGH (ref 65–99)
Potassium: 2.6 mmol/L — CL (ref 3.5–5.1)
Sodium: 145 mmol/L (ref 135–145)
Total Bilirubin: 0.4 mg/dL (ref 0.3–1.2)
Total Protein: 6.3 g/dL — ABNORMAL LOW (ref 6.5–8.1)

## 2014-09-07 LAB — MAGNESIUM: Magnesium: 1.8 mg/dL (ref 1.7–2.4)

## 2014-09-07 MED ORDER — PACLITAXEL PROTEIN-BOUND CHEMO INJECTION 100 MG
100.0000 mg/m2 | Freq: Once | INTRAVENOUS | Status: AC
  Administered 2014-09-07: 175 mg via INTRAVENOUS
  Filled 2014-09-07: qty 35

## 2014-09-07 MED ORDER — SODIUM CHLORIDE 0.9 % IV SOLN
800.0000 mg/m2 | Freq: Once | INTRAVENOUS | Status: AC
  Administered 2014-09-07: 1368 mg via INTRAVENOUS
  Filled 2014-09-07: qty 36

## 2014-09-07 MED ORDER — POTASSIUM CHLORIDE 20 MEQ/100ML IV SOLN: 20.0000 meq | Freq: Once | INTRAVENOUS | Status: DC

## 2014-09-07 MED ORDER — SODIUM CHLORIDE 0.9 % IV SOLN
INTRAVENOUS | Status: DC
  Administered 2014-09-07: 12:00:00 via INTRAVENOUS
  Filled 2014-09-07: qty 100

## 2014-09-07 MED ORDER — POTASSIUM CHLORIDE 10 MEQ/100ML IV SOLN: 10.0000 meq | INTRAVENOUS | Status: DC

## 2014-09-07 MED ORDER — HEPARIN SOD (PORK) LOCK FLUSH 100 UNIT/ML IV SOLN
INTRAVENOUS | Status: AC
  Filled 2014-09-07: qty 5

## 2014-09-07 MED ORDER — SODIUM CHLORIDE 0.9 % IV SOLN
Freq: Once | INTRAVENOUS | Status: AC
  Administered 2014-09-07: 14:00:00 via INTRAVENOUS
  Filled 2014-09-07: qty 4

## 2014-09-07 MED ORDER — SODIUM CHLORIDE 0.9 % IV SOLN
INTRAVENOUS | Status: DC
  Filled 2014-09-07: qty 100

## 2014-09-07 MED ORDER — SODIUM CHLORIDE 0.9 % IV SOLN
Freq: Once | INTRAVENOUS | Status: AC
  Administered 2014-09-07: 14:00:00 via INTRAVENOUS
  Filled 2014-09-07: qty 1000

## 2014-09-07 NOTE — Telephone Encounter (Signed)
Critical result of potassium of 2.6 reported to Dr. Mike Gip

## 2014-09-08 ENCOUNTER — Telehealth: Payer: Self-pay | Admitting: Hematology and Oncology

## 2014-09-08 ENCOUNTER — Telehealth: Payer: Self-pay | Admitting: *Deleted

## 2014-09-08 ENCOUNTER — Other Ambulatory Visit: Payer: Self-pay

## 2014-09-08 DIAGNOSIS — E876 Hypokalemia: Secondary | ICD-10-CM

## 2014-09-08 MED ORDER — HEPARIN SOD (PORK) LOCK FLUSH 100 UNIT/ML IV SOLN
500.0000 [IU] | Freq: Once | INTRAVENOUS | Status: AC
  Administered 2014-09-07: 500 [IU] via INTRAVENOUS

## 2014-09-08 MED ORDER — POTASSIUM CHLORIDE CRYS ER 20 MEQ PO TBCR: 20.0000 meq | EXTENDED_RELEASE_TABLET | Freq: Every day | ORAL | Status: DC

## 2014-09-08 MED ORDER — POTASSIUM CHLORIDE CRYS ER 10 MEQ PO TBCR: 10.0000 meq | EXTENDED_RELEASE_TABLET | Freq: Three times a day (TID) | ORAL | Status: DC

## 2014-09-08 NOTE — Addendum Note (Signed)
Addended by: Sinclair Grooms D on: 09/08/2014 10:22 AM   Modules accepted: Orders

## 2014-09-08 NOTE — Telephone Encounter (Signed)
Patient requesting refill for KlorCon 20 meq.  Only has enough for today. Please call to [facility].

## 2014-09-08 NOTE — Telephone Encounter (Signed)
Refill request sent

## 2014-09-08 NOTE — Telephone Encounter (Signed)
Re:  Potassium  Called to discuss potassium as an RX had been sent to his pharmacy for potassium chloride (K-Dur) 20 meq tablets.  I had sent an Rx for 10 meq TID.  The patient's wife had picked up the 20 meq tablets.  I subsequently called the pharmacy and cancelled the 10 meq tablets.  Per his wife, he has been taking potassium 20 meq BID for at least a week.  His original 20 meq Rx was for 1 pill a day and filled on 08/19/2014.  He has run out.  He has bben receiving Lasix 20 mg a day. As discussed yesterday, she is to give him lasix 20 mg as needed for significant edema.  At last evaluation, he had no peripheral edema.  Without lasix, he should stop wasting potassium.  We will check his potassium next week and make further adjustments.  For now, he will continue potassium 20 meq BID.  Lequita Asal, MD

## 2014-09-12 ENCOUNTER — Other Ambulatory Visit: Payer: Self-pay | Admitting: Oncology

## 2014-09-16 ENCOUNTER — Encounter: Payer: Self-pay | Admitting: Hematology and Oncology

## 2014-09-20 ENCOUNTER — Other Ambulatory Visit: Payer: Self-pay | Admitting: *Deleted

## 2014-09-20 DIAGNOSIS — C252 Malignant neoplasm of tail of pancreas: Secondary | ICD-10-CM

## 2014-09-21 ENCOUNTER — Inpatient Hospital Stay: Payer: Medicare Other | Attending: Oncology

## 2014-09-21 ENCOUNTER — Inpatient Hospital Stay (HOSPITAL_BASED_OUTPATIENT_CLINIC_OR_DEPARTMENT_OTHER): Payer: Medicare Other | Admitting: Oncology

## 2014-09-21 ENCOUNTER — Inpatient Hospital Stay: Payer: Medicare Other

## 2014-09-21 VITALS — BP 127/77 | HR 72 | Temp 96.0°F | Resp 18 | Ht 67.0 in | Wt 131.4 lb

## 2014-09-21 DIAGNOSIS — I10 Essential (primary) hypertension: Secondary | ICD-10-CM

## 2014-09-21 DIAGNOSIS — E785 Hyperlipidemia, unspecified: Secondary | ICD-10-CM | POA: Diagnosis not present

## 2014-09-21 DIAGNOSIS — Z7982 Long term (current) use of aspirin: Secondary | ICD-10-CM

## 2014-09-21 DIAGNOSIS — C252 Malignant neoplasm of tail of pancreas: Secondary | ICD-10-CM | POA: Diagnosis present

## 2014-09-21 DIAGNOSIS — R531 Weakness: Secondary | ICD-10-CM | POA: Insufficient documentation

## 2014-09-21 DIAGNOSIS — D509 Iron deficiency anemia, unspecified: Secondary | ICD-10-CM

## 2014-09-21 DIAGNOSIS — C801 Malignant (primary) neoplasm, unspecified: Secondary | ICD-10-CM

## 2014-09-21 DIAGNOSIS — Z79899 Other long term (current) drug therapy: Secondary | ICD-10-CM

## 2014-09-21 DIAGNOSIS — E119 Type 2 diabetes mellitus without complications: Secondary | ICD-10-CM

## 2014-09-21 DIAGNOSIS — E538 Deficiency of other specified B group vitamins: Secondary | ICD-10-CM | POA: Insufficient documentation

## 2014-09-21 DIAGNOSIS — R609 Edema, unspecified: Secondary | ICD-10-CM | POA: Diagnosis not present

## 2014-09-21 DIAGNOSIS — C787 Secondary malignant neoplasm of liver and intrahepatic bile duct: Secondary | ICD-10-CM | POA: Insufficient documentation

## 2014-09-21 DIAGNOSIS — G629 Polyneuropathy, unspecified: Secondary | ICD-10-CM | POA: Diagnosis not present

## 2014-09-21 DIAGNOSIS — R634 Abnormal weight loss: Secondary | ICD-10-CM

## 2014-09-21 DIAGNOSIS — Z5111 Encounter for antineoplastic chemotherapy: Secondary | ICD-10-CM | POA: Insufficient documentation

## 2014-09-21 DIAGNOSIS — R5383 Other fatigue: Secondary | ICD-10-CM | POA: Diagnosis not present

## 2014-09-21 LAB — CBC WITH DIFFERENTIAL/PLATELET
BASOS ABS: 0.1 10*3/uL (ref 0–0.1)
Basophils Relative: 1 %
EOS ABS: 0.1 10*3/uL (ref 0–0.7)
EOS PCT: 2 %
HCT: 25.5 % — ABNORMAL LOW (ref 40.0–52.0)
Hemoglobin: 8.3 g/dL — ABNORMAL LOW (ref 13.0–18.0)
LYMPHS PCT: 15 %
Lymphs Abs: 1.3 10*3/uL (ref 1.0–3.6)
MCH: 26.1 pg (ref 26.0–34.0)
MCHC: 32.3 g/dL (ref 32.0–36.0)
MCV: 80.7 fL (ref 80.0–100.0)
Monocytes Absolute: 0.6 10*3/uL (ref 0.2–1.0)
Monocytes Relative: 8 %
Neutro Abs: 6.2 10*3/uL (ref 1.4–6.5)
Neutrophils Relative %: 74 %
PLATELETS: 402 10*3/uL (ref 150–440)
RBC: 3.16 MIL/uL — ABNORMAL LOW (ref 4.40–5.90)
RDW: 20.1 % — ABNORMAL HIGH (ref 11.5–14.5)
WBC: 8.3 10*3/uL (ref 3.8–10.6)

## 2014-09-21 LAB — MAGNESIUM: MAGNESIUM: 1.8 mg/dL (ref 1.7–2.4)

## 2014-09-21 MED ORDER — KLOR-CON M20 20 MEQ PO TBCR: 20.0000 meq | EXTENDED_RELEASE_TABLET | Freq: Two times a day (BID) | ORAL | Status: DC

## 2014-09-21 MED ORDER — HEPARIN SOD (PORK) LOCK FLUSH 100 UNIT/ML IV SOLN
500.0000 [IU] | Freq: Once | INTRAVENOUS | Status: AC | PRN
  Administered 2014-09-21: 500 [IU]
  Filled 2014-09-21: qty 5

## 2014-09-21 MED ORDER — PACLITAXEL PROTEIN-BOUND CHEMO INJECTION 100 MG: 100.0000 mg/m2 | Freq: Once | INTRAVENOUS | Status: DC

## 2014-09-21 MED ORDER — SODIUM CHLORIDE 0.9 % IV SOLN: 800.0000 mg/m2 | Freq: Once | INTRAVENOUS | Status: DC

## 2014-09-21 MED ORDER — SODIUM CHLORIDE 0.9 % IV SOLN: Freq: Once | INTRAVENOUS | Status: DC

## 2014-09-21 MED ORDER — CYANOCOBALAMIN 1000 MCG/ML IJ SOLN
1000.0000 ug | Freq: Once | INTRAMUSCULAR | Status: AC
  Administered 2014-09-21: 1000 ug via INTRAMUSCULAR
  Filled 2014-09-21: qty 1

## 2014-09-22 ENCOUNTER — Other Ambulatory Visit: Payer: Self-pay | Admitting: *Deleted

## 2014-09-22 ENCOUNTER — Telehealth: Payer: Self-pay | Admitting: *Deleted

## 2014-09-22 LAB — CANCER ANTIGEN 19-9: CA 19-9: 256 U/mL — ABNORMAL HIGH (ref 0–35)

## 2014-09-22 MED ORDER — KLOR-CON M20 20 MEQ PO TBCR: 20.0000 meq | EXTENDED_RELEASE_TABLET | Freq: Two times a day (BID) | ORAL | Status: DC

## 2014-09-22 NOTE — Telephone Encounter (Signed)
rx sent to [facility]

## 2014-09-22 NOTE — Telephone Encounter (Signed)
Harry Roman spouse for [facility] called for patient stating he needs refill for Klor-Con 20 meq. She says she was told the prescription was sent yesterday, however, the pharmacy does not have it.  Patient is out of medication.

## 2014-09-22 NOTE — Telephone Encounter (Signed)
20 meq bid.  #60, 2 refills.  Thanks.

## 2014-09-26 NOTE — Progress Notes (Signed)
[facility] day:  08/24/2014  Chief Complaint: Harry Roman is a 69 y.o. male with metastatic pancreatic cancer who  Returns to clinic for consideration of his next infusion of gemcitabine and Abraxane.  HPI:   Patient returns to clinic today for further evaluation and consideration of his next infusion of chemotherapy. He continues to have increased edema, but otherwise feels well. He has no neurologic complaints. He denies any recent fevers.   He does not complain of pain today. He has a fair appetite. He denies any nausea, vomiting,  Constipation, or diarrhea.  He offers no further specific complaints today.  Past Medical History  Diagnosis Date  . Hypertension   . Diabetes mellitus without complication   . Hyperlipidemia   . Cancer 2016    pancreas  . Ascites 2016  . 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 Hemorrhoid surgery    . Colonoscopy  Jan 2015    Dr Donnella Sham  . Peritoneal fluid aspiration  2016   No family history on file.  Social History:  reports that he has never smoked. He does not have any smokeless tobacco history on file. He reports that he does not drink alcohol or use illicit drugs.  The patient is accompanied by his wife.  Allergies: No Known Allergies  Current Medications: Current Outpatient Prescriptions  Medication Sig Dispense Refill  . aspirin 81 MG tablet Take 81 mg by mouth daily.    . carvedilol (COREG) 12.5 MG tablet Take 12.5 mg by mouth 2 (two) times daily with a meal.    . enalapril (VASOTEC) 10 MG tablet     . enalapril (VASOTEC) 20 MG tablet Take 20 mg by mouth daily.    . insulin regular (NOVOLIN R,HUMULIN R) 100 units/mL injection Sliding Scale  Glucose 200-300 take 2 units Glucose 301-400 take 4 units  Glucose 401-500 take 6 units 10 mL 1  . levothyroxine (SYNTHROID, LEVOTHROID) 50 MCG tablet Take 50 mcg by mouth daily before  breakfast.    . lovastatin (MEVACOR) 10 MG tablet Take 10 mg by mouth at bedtime.    . mirtazapine (REMERON) 15 MG tablet Take 15 mg by mouth every evening.  0  . nystatin (MYCOSTATIN) 100000 UNIT/ML suspension     . KLOR-CON M20 20 MEQ tablet Take 1 tablet (20 mEq total) by mouth 2 (two) times daily. 60 tablet 2   No current facility-administered medications for this visit.   Facility-Administered Medications Ordered in Other Visits  Medication Dose Route Frequency Provider Last Rate Last Dose  . sodium chloride 0.9 % 100 mL with potassium chloride 20 mEq, magnesium sulfate 2 g infusion   Intravenous Continuous Lequita Asal, MD   Stopped at 08/10/14 1655  . sodium chloride 0.9 % injection 10 mL  10 mL Intracatheter PRN Lequita Asal, MD   10 mL at 08/10/14 1410  . sodium chloride 0.9 % injection 10 mL  10 mL Intravenous PRN Lequita Asal, MD   10 mL at 08/31/14 7062   Review of Systems:  GENERAL:  Less fatigue. More active.  No fevers, sweats or weight loss. PERFORMANCE STATUS (ECOG):  2 HEENT:  No visual changes, runny nose, sore throat, mouth sores or tenderness. Lungs: No shortness of breath or cough.  No hemoptysis. Cardiac:  No chest pain, palpitations, orthopnea, or PND. GI:  Resolution of abdominal distention.  Eating  more.   No nausea, vomiting, diarrhea, constipation, melena or hematochezia. GU:  Genital swelling, resolved.  No urgency, frequency, dysuria, or hematuria. Musculoskeletal:  No back pain.  No joint pain.  No muscle tenderness. Extremities:  Lower extremity edema, resolved. Skin:  No rashes or skin changes. Neuro:  No headache, focal numbness or weakness, balance or coordination issues. Endocrine:  No diabetes, thyroid issues, hot flashes or night sweats. Psych:  No mood changes, depression or anxiety. Pain:  Pain well controlled. Review of systems:  All other systems reviewed and found to be negative.  Physical Exam: Blood pressure 127/77, pulse  72, temperature 96 F (35.6 C), temperature source Tympanic, resp. rate 18, height 5\' 7"  (1.702 m), weight 131 lb 6.3 oz (59.6 kg).  GENERAL:  Chronically fatigued elderly gentleman sitting in a wheelchair in the exam room in no acute distress. MENTAL STATUS:  Alert and oriented to person, place and time. HEAD:  Wearing a black cap.  Alopecia.  Temporal wasting.  Normocephalic, atraumatic, face symmetric, no Cushingoid features. EYES:  Brown eyes.  Pupils equal round and reactive to light and accomodation.  No conjunctivitis or scleral icterus. ENT:  Oropharynx clear without lesion.  Tongue normal. Mucous membranes moist.  RESPIRATORY:  Clear to auscultation without rales, wheezes or rhonchi. CARDIOVASCULAR:  Regular rate and rhythm without murmur, rub or gallop. ABDOMEN: Soft, non-tender, with active bowel sounds, and no appreciable hepatosplenomegaly.  No appreciable ascites. SKIN:  No rashes, ulcers or lesions. EXTREMITIES: No lower extremity edema.  No palpable cords. LYMPH NODES: No palpable cervical, supraclavicular, axillary or inguinal adenopathy  NEUROLOGICAL: Unremarkable. PSYCH:  Appropriate.  Office Visit on 09/21/2014  Component Date Value Ref Range Status  . Magnesium 09/21/2014 1.8  1.7 - 2.4 mg/dL Final  . WBC 09/21/2014 8.3  3.8 - 10.6 K/uL Final   A-LINE DRAW  . RBC 09/21/2014 3.16* 4.40 - 5.90 MIL/uL Final  . Hemoglobin 09/21/2014 8.3* 13.0 - 18.0 g/dL Final  . HCT 09/21/2014 25.5* 40.0 - 52.0 % Final  . MCV 09/21/2014 80.7  80.0 - 100.0 fL Final  . MCH 09/21/2014 26.1  26.0 - 34.0 pg Final  . MCHC 09/21/2014 32.3  32.0 - 36.0 g/dL Final  . RDW 09/21/2014 20.1* 11.5 - 14.5 % Final  . Platelets 09/21/2014 402  150 - 440 K/uL Final  . Neutrophils Relative % 09/21/2014 74   Final  . Neutro Abs 09/21/2014 6.2  1.4 - 6.5 K/uL Final  . Lymphocytes Relative 09/21/2014 15   Final  . Lymphs Abs 09/21/2014 1.3  1.0 - 3.6 K/uL Final  . Monocytes Relative 09/21/2014 8   Final   . Monocytes Absolute 09/21/2014 0.6  0.2 - 1.0 K/uL Final  . Eosinophils Relative 09/21/2014 2   Final  . Eosinophils Absolute 09/21/2014 0.1  0 - 0.7 K/uL Final  . Basophils Relative 09/21/2014 1   Final  . Basophils Absolute 09/21/2014 0.1  0 - 0.1 K/uL Final  . CA 19-9 09/21/2014 256* 0 - 35 U/mL Final   Comment: (NOTE) Roche ECLIA methodology Performed At: [facility] [address] [place], [place] [id number] Lindon Romp MD TD:[id number]     Assessment:  Harry Roman is a 69 y.o. African-American gentleman with metastatic pancreatic cancer.  He presented with unexplained weight loss 1 year, intermittent nausea vomiting 1 month, and a 1 week history of abdominal distention.  He lost 30-40 pounds in the past year.  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 Chest CT on 05/01/2014 revealed no evidence of metastatic disease.  Colonoscopy on 02/2013 was negative.  CA19-9 was 713 on 04/21/2014.  He underwent paracentesis x 2 (1.85 liters on 04/21/2014 and 2.9 liters on 05/01/2014).  Cytology was negative.  Omental biopsy on 04/21/2014 revealed metastatic adenocarcinoma consistent with pancreatic cancer (CA19-9 stains were positive).  He is B12 deficient (B12 was 34 on 04/21/2014).  He is on B12 supplimentation.  He has a microcytic anemia consistent with iron deficiency.  He is status post 4 cycles of gemcitabine and Abraxane (05/04/2014 - 07/27/2014).  Sister Wynona Dove nodule has resolved.  He last required a paracentesis on 05/01/2014.  CA19-9 has decreased from 1322 on 05/04/2014, 738 on 06/01/2014, 365 on 06/29/2014, and 157 on 07/27/2014.  Abdominal and pelvic CT scan on 07/24/2014 revealed slight  interval decrease in the posterior left hepatic lesion and in the bulkiness of the omental disease.  The portal vein was attenuated, but patent.  There was increased ascites.  He underwent paracentesis on 07/31/2014.  5 liters were removed.  He was started on lasix and potassium.  Symptomatically, he has improved over the past month.  He is eating better.  He is more active.  His edema has resolved on Lasix.  He has not used any pain medications in over a month.  His magnesium and potassium are low.  Plan: 1. Labs today- CBC with diff, CMP, CA19.9. 2. Patient wishes to delay chemotherapy with gemcitabine and Abraxane today.  CA-19-9 is trending up from 181 to 256. 3. Magnesium is 1.8 today. 4. Continue potassium oral supplementation at 20 mEq twice per day. 5. Continue Lasix prn (no longer scheduled). 6. RTC in 1 week for repeat laboratory work , further evaluation, and consideration of additional chemotherapy.   Lloyd Huger, MD

## 2014-09-28 ENCOUNTER — Inpatient Hospital Stay: Payer: Medicare Other

## 2014-09-28 ENCOUNTER — Telehealth: Payer: Self-pay

## 2014-09-28 ENCOUNTER — Inpatient Hospital Stay (HOSPITAL_BASED_OUTPATIENT_CLINIC_OR_DEPARTMENT_OTHER): Payer: Medicare Other | Admitting: Hematology and Oncology

## 2014-09-28 VITALS — BP 148/88 | HR 69 | Temp 96.6°F | Resp 18 | Ht 67.0 in | Wt 130.7 lb

## 2014-09-28 DIAGNOSIS — E785 Hyperlipidemia, unspecified: Secondary | ICD-10-CM

## 2014-09-28 DIAGNOSIS — Z7982 Long term (current) use of aspirin: Secondary | ICD-10-CM

## 2014-09-28 DIAGNOSIS — D509 Iron deficiency anemia, unspecified: Secondary | ICD-10-CM

## 2014-09-28 DIAGNOSIS — C787 Secondary malignant neoplasm of liver and intrahepatic bile duct: Secondary | ICD-10-CM

## 2014-09-28 DIAGNOSIS — C801 Malignant (primary) neoplasm, unspecified: Secondary | ICD-10-CM

## 2014-09-28 DIAGNOSIS — E538 Deficiency of other specified B group vitamins: Secondary | ICD-10-CM | POA: Diagnosis not present

## 2014-09-28 DIAGNOSIS — I10 Essential (primary) hypertension: Secondary | ICD-10-CM

## 2014-09-28 DIAGNOSIS — R5383 Other fatigue: Secondary | ICD-10-CM

## 2014-09-28 DIAGNOSIS — Z79899 Other long term (current) drug therapy: Secondary | ICD-10-CM

## 2014-09-28 DIAGNOSIS — C252 Malignant neoplasm of tail of pancreas: Secondary | ICD-10-CM

## 2014-09-28 DIAGNOSIS — E119 Type 2 diabetes mellitus without complications: Secondary | ICD-10-CM

## 2014-09-28 DIAGNOSIS — G629 Polyneuropathy, unspecified: Secondary | ICD-10-CM

## 2014-09-28 DIAGNOSIS — R531 Weakness: Secondary | ICD-10-CM

## 2014-09-28 LAB — CBC WITH DIFFERENTIAL/PLATELET
BASOS PCT: 1 %
Basophils Absolute: 0.1 10*3/uL (ref 0–0.1)
EOS ABS: 0.1 10*3/uL (ref 0–0.7)
Eosinophils Relative: 1 %
HEMATOCRIT: 26.5 % — AB (ref 40.0–52.0)
HEMOGLOBIN: 8.5 g/dL — AB (ref 13.0–18.0)
LYMPHS ABS: 1.5 10*3/uL (ref 1.0–3.6)
Lymphocytes Relative: 18 %
MCH: 26.3 pg (ref 26.0–34.0)
MCHC: 32.2 g/dL (ref 32.0–36.0)
MCV: 81.8 fL (ref 80.0–100.0)
MONOS PCT: 10 %
Monocytes Absolute: 0.8 10*3/uL (ref 0.2–1.0)
Neutro Abs: 5.6 10*3/uL (ref 1.4–6.5)
Neutrophils Relative %: 70 %
Platelets: 371 10*3/uL (ref 150–440)
RBC: 3.24 MIL/uL — AB (ref 4.40–5.90)
RDW: 21.8 % — ABNORMAL HIGH (ref 11.5–14.5)
WBC: 8.2 10*3/uL (ref 3.8–10.6)

## 2014-09-28 LAB — COMPREHENSIVE METABOLIC PANEL
ALK PHOS: 66 U/L (ref 38–126)
ALT: 12 U/L — ABNORMAL LOW (ref 17–63)
ANION GAP: 3 — AB (ref 5–15)
AST: 20 U/L (ref 15–41)
Albumin: 1.8 g/dL — ABNORMAL LOW (ref 3.5–5.0)
BUN: 9 mg/dL (ref 6–20)
CALCIUM: 7.3 mg/dL — AB (ref 8.9–10.3)
CO2: 23 mmol/L (ref 22–32)
Chloride: 107 mmol/L (ref 101–111)
Creatinine, Ser: 0.82 mg/dL (ref 0.61–1.24)
GFR calc non Af Amer: >60 mL/min (ref >60)
GLUCOSE: 191 mg/dL — AB (ref 65–99)
Potassium: 3.6 mmol/L (ref 3.5–5.1)
SODIUM: 133 mmol/L — AB (ref 135–145)
Total Bilirubin: 0.3 mg/dL (ref 0.3–1.2)
Total Protein: 5.9 g/dL — ABNORMAL LOW (ref 6.5–8.1)

## 2014-09-28 LAB — MAGNESIUM: Magnesium: 1.7 mg/dL (ref 1.7–2.4)

## 2014-09-28 MED ORDER — HEPARIN SOD (PORK) LOCK FLUSH 100 UNIT/ML IV SOLN
500.0000 [IU] | Freq: Once | INTRAVENOUS | Status: AC
Start: 2014-09-28 — End: 2014-09-28
  Administered 2014-09-28: 500 [IU] via INTRAVENOUS
  Filled 2014-09-28: qty 5

## 2014-09-28 MED ORDER — SODIUM CHLORIDE 0.9 % IJ SOLN
10.0000 mL | INTRAMUSCULAR | Status: AC | PRN
  Administered 2014-09-28: 10 mL via INTRAVENOUS
  Filled 2014-09-28: qty 10

## 2014-09-28 NOTE — Progress Notes (Signed)
Patient states that he is still weak. He states that overall his appetite has been pretty good. He denies any pain today.

## 2014-09-28 NOTE — Telephone Encounter (Signed)
Pt left with out having his port access dc'd.  I called all numbers in pt's chart and left message for pt to return my phone call so we can have him come back to de access his port.

## 2014-09-29 LAB — CANCER ANTIGEN 19-9: CA 19 9: 176 U/mL — AB (ref 0–35)

## 2014-09-30 ENCOUNTER — Other Ambulatory Visit: Payer: Self-pay | Admitting: Hematology and Oncology

## 2014-09-30 ENCOUNTER — Encounter: Payer: Self-pay | Admitting: Hematology and Oncology

## 2014-09-30 DIAGNOSIS — G629 Polyneuropathy, unspecified: Secondary | ICD-10-CM

## 2014-09-30 HISTORY — DX: Polyneuropathy, unspecified: G62.9

## 2014-09-30 NOTE — Progress Notes (Signed)
[facility] day:  09/28/2014  Chief Complaint: Harry Roman is a 69 y.o. male with metastatic pancreatic cancer who is seen for assessment on day 1 of cycle #6 gemcitabine and Abraxane.  HPI: The patient was last seen in the medical oncology clinic on 08/24/2014. At that time, he had improved significantly over the past month.  He had not needed any pain pills.  He had no edema.  He was getting around the house more.  He was participating in his activities of daily living.  He began cycle #5 chemotherapy.  He was seen by Dr. Delight Hoh on 09/26/2014 for assessment prior to cycle #6.  He noted extreme fatigue and wished to defer chemotherapy.  CA19-9 had increased to 256.  During the interim, he notes ongoing weakness.  When questioned, this is not different than prior weeks. He notes that last week his hands began to hurt.  He also notes numbness. He is taking Lasix only as needed. He is taking a potassium twice daily. His wife notes that he is eating better. He is gaining weight. He denies any pain.  Past Medical History  Diagnosis Date  . Hypertension   . Diabetes mellitus without complication   . Hyperlipidemia   . Cancer 2016    pancreas  . Ascites 2016  . 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 Hemorrhoid surgery    . Colonoscopy  Jan 2015    Dr Donnella Sham  . Peritoneal fluid aspiration  2016   No family history on file.  Social History:  reports that he has never smoked. He does not have any smokeless tobacco history on file. He reports that he does not drink alcohol or use illicit drugs.  The patient is accompanied by his wife.  Allergies: No Known Allergies  Current Medications: Current Outpatient Prescriptions  Medication Sig Dispense Refill  . aspirin 81 MG tablet Take 81 mg by mouth daily.    . carvedilol (COREG) 12.5 MG tablet Take 12.5 mg by mouth 2  (two) times daily with a meal.    . enalapril (VASOTEC) 10 MG tablet     . enalapril (VASOTEC) 20 MG tablet Take 20 mg by mouth daily.    . insulin regular (NOVOLIN R,HUMULIN R) 100 units/mL injection Sliding Scale  Glucose 200-300 take 2 units Glucose 301-400 take 4 units  Glucose 401-500 take 6 units 10 mL 1  . KLOR-CON M20 20 MEQ tablet Take 1 tablet (20 mEq total) by mouth 2 (two) times daily. 60 tablet 2  . levothyroxine (SYNTHROID, LEVOTHROID) 50 MCG tablet Take 50 mcg by mouth daily before breakfast.    . lovastatin (MEVACOR) 10 MG tablet Take 10 mg by mouth at bedtime.    . mirtazapine (REMERON) 15 MG tablet Take 15 mg by mouth every evening.  0  . nystatin (MYCOSTATIN) 100000 UNIT/ML suspension      No current facility-administered medications for this visit.   Facility-Administered Medications Ordered in Other Visits  Medication Dose Route Frequency Provider Last Rate Last Dose  . sodium chloride 0.9 % 100 mL with potassium chloride 20 mEq, magnesium sulfate 2 g infusion   Intravenous Continuous Lequita Asal, MD   Stopped at 08/10/14 1655  . sodium chloride 0.9 % injection 10 mL  10 mL Intracatheter PRN Lequita Asal, MD   10 mL at 08/10/14 1410  . sodium chloride  0.9 % injection 10 mL  10 mL Intravenous PRN Lequita Asal, MD   10 mL at 08/31/14 0927  . sodium chloride 0.9 % injection 10 mL  10 mL Intravenous PRN Lequita Asal, MD   10 mL at 09/28/14 1008   Review of Systems:  GENERAL: Fatigued.  No fevers, sweats or weight loss. PERFORMANCE STATUS (ECOG):  3 HEENT:  No visual changes, runny nose, sore throat, mouth sores or tenderness. Lungs: No shortness of breath or cough.  No hemoptysis. Cardiac:  No chest pain, palpitations, orthopnea, or PND. GI:  Eating more.   No nausea, vomiting, diarrhea, constipation, melena or hematochezia. GU:  No urgency, frequency, dysuria, or hematuria. Musculoskeletal:  No back pain.  No joint pain.  No muscle  tenderness. Extremities:  No lower extremity edema. Skin:  No rashes or skin changes. Neuro:  Numbness in hands (new), bothersome.  No headache, focal weakness, balance or coordination issues. Endocrine:  No diabetes, thyroid issues, hot flashes or night sweats. Psych:  No mood changes, depression or anxiety. Pain:  Pain well controlled. Review of systems:  All other systems reviewed and found to be negative.  Physical Exam: Blood pressure 148/88, pulse 69, temperature 96.6 F (35.9 C), temperature source Tympanic, resp. rate 18, height 5' 7"  (1.702 m), weight 130 lb 11.7 oz (59.3 kg).  GENERAL:  Chronically fatigued elderly gentleman sitting in a wheelchair in the exam room in no acute distress. MENTAL STATUS:  Alert and oriented to person, place and time. HEAD:  Wearing a cap.  Alopecia.  Temporal wasting.  Normocephalic, atraumatic, face symmetric, no Cushingoid features. EYES:  Brown eyes.  Pupils equal round and reactive to light and accomodation.  No conjunctivitis or scleral icterus. ENT:  Oropharynx clear without lesion.  Tongue normal. Mucous membranes moist.  RESPIRATORY:  Clear to auscultation without rales, wheezes or rhonchi. CARDIOVASCULAR:  Regular rate and rhythm without murmur, rub or gallop. ABDOMEN: Soft, non-tender, with active bowel sounds, and no appreciable hepatosplenomegaly.  No ascites. SKIN:  No rashes, ulcers or lesions. EXTREMITIES: No lower extremity edema.  No palpable cords. LYMPH NODES: No palpable cervical, supraclavicular, axillary or inguinal adenopathy  NEUROLOGICAL: Unremarkable. PSYCH:  Appropriate.  Infusion on 09/28/2014  Component Date Value Ref Range Status  . Sodium 09/28/2014 133* 135 - 145 mmol/L Final  . Potassium 09/28/2014 3.6  3.5 - 5.1 mmol/L Final  . Chloride 09/28/2014 107  101 - 111 mmol/L Final  . CO2 09/28/2014 23  22 - 32 mmol/L Final  . Glucose, Bld 09/28/2014 191* 65 - 99 mg/dL Final  . BUN 09/28/2014 9  6 - 20 mg/dL Final   . Creatinine, Ser 09/28/2014 0.82  0.61 - 1.24 mg/dL Final  . Calcium 09/28/2014 7.3* 8.9 - 10.3 mg/dL Final  . Total Protein 09/28/2014 5.9* 6.5 - 8.1 g/dL Final  . Albumin 09/28/2014 1.8* 3.5 - 5.0 g/dL Final  . AST 09/28/2014 20  15 - 41 U/L Final  . ALT 09/28/2014 12* 17 - 63 U/L Final  . Alkaline Phosphatase 09/28/2014 66  38 - 126 U/L Final  . Total Bilirubin 09/28/2014 0.3  0.3 - 1.2 mg/dL Final  . GFR calc non Af Amer 09/28/2014 >60  >60 mL/min Final  . GFR calc Af Amer 09/28/2014 >60  >60 mL/min Final   Comment: (NOTE) The eGFR has been calculated using the CKD EPI equation. This calculation has not been validated in all clinical situations. eGFR's persistently <60 mL/min signify possible Chronic  Kidney Disease.   . Anion gap 09/28/2014 3* 5 - 15 Final  . CA 19-9 09/28/2014 176* 0 - 35 U/mL Final   Comment: (NOTE) [employer] ECLIA methodology Performed At: [facility] [place], [place] [id number] Lindon Romp MD ES:[id number]   . Magnesium 09/28/2014 1.7  1.7 - 2.4 mg/dL Final  . WBC 09/28/2014 8.2  3.8 - 10.6 K/uL Final   A-LINE DRAW  . RBC 09/28/2014 3.24* 4.40 - 5.90 MIL/uL Final  . Hemoglobin 09/28/2014 8.5* 13.0 - 18.0 g/dL Final  . HCT 09/28/2014 26.5* 40.0 - 52.0 % Final  . MCV 09/28/2014 81.8  80.0 - 100.0 fL Final  . MCH 09/28/2014 26.3  26.0 - 34.0 pg Final  . MCHC 09/28/2014 32.2  32.0 - 36.0 g/dL Final  . RDW 09/28/2014 21.8* 11.5 - 14.5 % Final  . Platelets 09/28/2014 371  150 - 440 K/uL Final  . Neutrophils Relative % 09/28/2014 70   Final  . Neutro Abs 09/28/2014 5.6  1.4 - 6.5 K/uL Final  . Lymphocytes Relative 09/28/2014 18   Final  . Lymphs Abs 09/28/2014 1.5  1.0 - 3.6 K/uL Final  . Monocytes Relative 09/28/2014 10   Final  . Monocytes Absolute 09/28/2014 0.8  0.2 - 1.0 K/uL Final  . Eosinophils Relative 09/28/2014 1   Final  . Eosinophils Absolute 09/28/2014 0.1  0 - 0.7 K/uL Final  . Basophils Relative 09/28/2014 1    Final  . Basophils Absolute 09/28/2014 0.1  0 - 0.1 K/uL Final    Assessment:  ORIN EBERWEIN is a 69 y.o. African-American gentleman with metastatic pancreatic cancer.  He presented with unexplained weight loss 1 year, intermittent nausea vomiting 1 month, and a 1 week history of abdominal distention.  He lost 30-40 pounds in the past year.  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 Chest CT on 05/01/2014 revealed no evidence of metastatic disease.  Colonoscopy on 02/2013 was negative.  CA19-9 was 713 on 04/21/2014.  He underwent paracentesis x 2 (1.85 liters on 04/21/2014 and 2.9 liters on 05/01/2014).  Cytology was negative.  Omental biopsy on 04/21/2014 revealed metastatic adenocarcinoma consistent with pancreatic cancer (CA19-9 stains were positive).  He is B12 deficient (B12 was 34 on 04/21/2014).  He is on B12 supplimentation.  He has a microcytic anemia consistent with iron deficiency.  He is status post 5 cycles of gemcitabine and Abraxane (05/04/2014 - 08/24/2014).  Sister Wynona Dove nodule has resolved.  He last required a paracentesis on 05/01/2014.  CA19-9 has decreased from 1322 on 05/04/2014, 738 on 06/01/2014, 365 on 06/29/2014, 157 on 07/27/2014, and 256 on 09/21/2014.  Abdominal and pelvic CT scan on 07/24/2014 revealed slight interval decrease in the posterior left hepatic lesion and in the bulkiness of the omental disease.  The portal vein was attenuated, but patent.  There was increased ascites.  He underwent paracentesis on 07/31/2014.  5 liters were removed.  He was started on lasix and potassium.  Symptomatically, he is fatigued.  He has a grade II neuropathy (new).  He is eating better.  His edema has  resolved; he uses Lasix only as needed.  He denies any pain.  His potassium is normal on supplimentation.  Plan:  1. Labs today- CBC with diff, CMP, CA19.9. 2. Discuss current symptoms, last CA19-9, and patient's thoughts about therapy. 3. Postpone cycle #6 chemotherapy today. 4. Contact patient with follow-up CA19-9.   5. If CA19-9 is increasing, repeat CT scan.  If decreasing, schedule chemotherapy with level 1 dose reduction of Abraxane.  Addendum:  CA19-9 result was called to patient.  Level is decreasing.  Reschedule chemotherapy for next week.  RTC 4 weeks after start of cycle #6 for MD visit, labs, and cycle #7 chemotherapy.    Lequita Asal, MD  09/28/2014

## 2014-10-05 ENCOUNTER — Inpatient Hospital Stay: Payer: Medicare Other

## 2014-10-05 ENCOUNTER — Other Ambulatory Visit: Payer: Medicare Other

## 2014-10-05 VITALS — BP 139/80 | HR 65 | Temp 98.0°F | Resp 18

## 2014-10-05 DIAGNOSIS — C801 Malignant (primary) neoplasm, unspecified: Secondary | ICD-10-CM

## 2014-10-05 DIAGNOSIS — C252 Malignant neoplasm of tail of pancreas: Secondary | ICD-10-CM | POA: Diagnosis not present

## 2014-10-05 LAB — COMPREHENSIVE METABOLIC PANEL
ALT: 13 U/L — ABNORMAL LOW (ref 17–63)
AST: 24 U/L (ref 15–41)
Albumin: 1.9 g/dL — ABNORMAL LOW (ref 3.5–5.0)
Alkaline Phosphatase: 62 U/L (ref 38–126)
Anion gap: 4 — ABNORMAL LOW (ref 5–15)
BUN: 15 mg/dL (ref 6–20)
CO2: 24 mmol/L (ref 22–32)
Calcium: 7.5 mg/dL — ABNORMAL LOW (ref 8.9–10.3)
Chloride: 105 mmol/L (ref 101–111)
Creatinine, Ser: 0.98 mg/dL (ref 0.61–1.24)
GFR calc Af Amer: >60 mL/min (ref >60)
GFR calc non Af Amer: >60 mL/min (ref >60)
Glucose, Bld: 261 mg/dL — ABNORMAL HIGH (ref 65–99)
Potassium: 3.9 mmol/L (ref 3.5–5.1)
Sodium: 133 mmol/L — ABNORMAL LOW (ref 135–145)
Total Bilirubin: 0.3 mg/dL (ref 0.3–1.2)
Total Protein: 6.1 g/dL — ABNORMAL LOW (ref 6.5–8.1)

## 2014-10-05 LAB — CBC WITH DIFFERENTIAL/PLATELET
Basophils Absolute: 0.1 10*3/uL (ref 0–0.1)
Basophils Relative: 1 %
Eosinophils Absolute: 0.1 10*3/uL (ref 0–0.7)
Eosinophils Relative: 1 %
HCT: 27.8 % — ABNORMAL LOW (ref 40.0–52.0)
Hemoglobin: 8.9 g/dL — ABNORMAL LOW (ref 13.0–18.0)
Lymphocytes Relative: 18 %
Lymphs Abs: 1.5 10*3/uL (ref 1.0–3.6)
MCH: 26.6 pg (ref 26.0–34.0)
MCHC: 32.1 g/dL (ref 32.0–36.0)
MCV: 83 fL (ref 80.0–100.0)
Monocytes Absolute: 0.7 10*3/uL (ref 0.2–1.0)
Monocytes Relative: 8 %
Neutro Abs: 6 10*3/uL (ref 1.4–6.5)
Neutrophils Relative %: 72 %
Platelets: 203 10*3/uL (ref 150–440)
RBC: 3.34 MIL/uL — ABNORMAL LOW (ref 4.40–5.90)
RDW: 23.7 % — ABNORMAL HIGH (ref 11.5–14.5)
WBC: 8.3 10*3/uL (ref 3.8–10.6)

## 2014-10-05 LAB — MAGNESIUM: Magnesium: 1.7 mg/dL (ref 1.7–2.4)

## 2014-10-05 MED ORDER — HEPARIN SOD (PORK) LOCK FLUSH 100 UNIT/ML IV SOLN
500.0000 [IU] | Freq: Once | INTRAVENOUS | Status: AC
  Administered 2014-10-05: 500 [IU] via INTRAVENOUS

## 2014-10-05 MED ORDER — SODIUM CHLORIDE 0.9 % IJ SOLN
10.0000 mL | INTRAMUSCULAR | Status: DC | PRN
  Administered 2014-10-05: 10 mL
  Filled 2014-10-05: qty 10

## 2014-10-05 MED ORDER — HEPARIN SOD (PORK) LOCK FLUSH 100 UNIT/ML IV SOLN: 500.0000 [IU] | Freq: Once | INTRAVENOUS | Status: DC | PRN

## 2014-10-05 MED ORDER — SODIUM CHLORIDE 0.9 % IJ SOLN
10.0000 mL | Freq: Once | INTRAMUSCULAR | Status: AC
  Administered 2014-10-05: 10 mL via INTRAVENOUS
  Filled 2014-10-05: qty 10

## 2014-10-05 MED ORDER — PACLITAXEL PROTEIN-BOUND CHEMO INJECTION 100 MG
75.0000 mg/m2 | Freq: Once | INTRAVENOUS | Status: AC
  Administered 2014-10-05: 125 mg via INTRAVENOUS
  Filled 2014-10-05: qty 25

## 2014-10-05 MED ORDER — SODIUM CHLORIDE 0.9 % IV SOLN
Freq: Once | INTRAVENOUS | Status: AC
  Administered 2014-10-05: 12:00:00 via INTRAVENOUS
  Filled 2014-10-05: qty 4

## 2014-10-05 MED ORDER — SODIUM CHLORIDE 0.9 % IV SOLN
800.0000 mg/m2 | Freq: Once | INTRAVENOUS | Status: AC
  Administered 2014-10-05: 1368 mg via INTRAVENOUS
  Filled 2014-10-05: qty 35.98

## 2014-10-05 MED ORDER — SODIUM CHLORIDE 0.9 % IV SOLN
Freq: Once | INTRAVENOUS | Status: AC
Start: 2014-10-05 — End: 2014-10-05
  Administered 2014-10-05: 12:00:00 via INTRAVENOUS
  Filled 2014-10-05: qty 1000

## 2014-10-07 ENCOUNTER — Emergency Department: Payer: Medicare Other

## 2014-10-07 ENCOUNTER — Encounter: Payer: Self-pay | Admitting: Emergency Medicine

## 2014-10-07 ENCOUNTER — Inpatient Hospital Stay
Admission: EM | Admit: 2014-10-07 | Discharge: 2014-10-09 | DRG: 074 | Disposition: A | Discharge status: Home / Self Care | Payer: Medicare Other | Attending: Internal Medicine | Admitting: Internal Medicine

## 2014-10-07 DIAGNOSIS — Z9221 Personal history of antineoplastic chemotherapy: Secondary | ICD-10-CM | POA: Diagnosis not present

## 2014-10-07 DIAGNOSIS — Z9889 Other specified postprocedural states: Secondary | ICD-10-CM | POA: Diagnosis not present

## 2014-10-07 DIAGNOSIS — R1310 Dysphagia, unspecified: Secondary | ICD-10-CM | POA: Diagnosis present

## 2014-10-07 DIAGNOSIS — Z7982 Long term (current) use of aspirin: Secondary | ICD-10-CM

## 2014-10-07 DIAGNOSIS — Z79899 Other long term (current) drug therapy: Secondary | ICD-10-CM | POA: Diagnosis not present

## 2014-10-07 DIAGNOSIS — R748 Abnormal levels of other serum enzymes: Secondary | ICD-10-CM | POA: Diagnosis present

## 2014-10-07 DIAGNOSIS — E785 Hyperlipidemia, unspecified: Secondary | ICD-10-CM | POA: Diagnosis present

## 2014-10-07 DIAGNOSIS — I10 Essential (primary) hypertension: Secondary | ICD-10-CM | POA: Diagnosis present

## 2014-10-07 DIAGNOSIS — R29810 Facial weakness: Secondary | ICD-10-CM | POA: Diagnosis not present

## 2014-10-07 DIAGNOSIS — IMO0002 Reserved for concepts with insufficient information to code with codable children: Secondary | ICD-10-CM

## 2014-10-07 DIAGNOSIS — C252 Malignant neoplasm of tail of pancreas: Secondary | ICD-10-CM | POA: Diagnosis present

## 2014-10-07 DIAGNOSIS — C7889 Secondary malignant neoplasm of other digestive organs: Secondary | ICD-10-CM | POA: Diagnosis present

## 2014-10-07 DIAGNOSIS — Z9181 History of falling: Secondary | ICD-10-CM | POA: Diagnosis not present

## 2014-10-07 DIAGNOSIS — E1140 Type 2 diabetes mellitus with diabetic neuropathy, unspecified: Secondary | ICD-10-CM | POA: Diagnosis present

## 2014-10-07 DIAGNOSIS — E119 Type 2 diabetes mellitus without complications: Secondary | ICD-10-CM

## 2014-10-07 DIAGNOSIS — E039 Hypothyroidism, unspecified: Secondary | ICD-10-CM | POA: Diagnosis present

## 2014-10-07 DIAGNOSIS — G510 Bell's palsy: Secondary | ICD-10-CM | POA: Diagnosis present

## 2014-10-07 DIAGNOSIS — Z833 Family history of diabetes mellitus: Secondary | ICD-10-CM

## 2014-10-07 DIAGNOSIS — I639 Cerebral infarction, unspecified: Secondary | ICD-10-CM

## 2014-10-07 DIAGNOSIS — C259 Malignant neoplasm of pancreas, unspecified: Secondary | ICD-10-CM | POA: Diagnosis not present

## 2014-10-07 DIAGNOSIS — Z8249 Family history of ischemic heart disease and other diseases of the circulatory system: Secondary | ICD-10-CM

## 2014-10-07 DIAGNOSIS — I69392 Facial weakness following cerebral infarction: Secondary | ICD-10-CM | POA: Diagnosis present

## 2014-10-07 HISTORY — DX: Hypothyroidism, unspecified: E03.9

## 2014-10-07 LAB — CBC
HCT: 24.2 % — ABNORMAL LOW (ref 40.0–52.0)
Hemoglobin: 7.6 g/dL — ABNORMAL LOW (ref 13.0–18.0)
MCH: 26.3 pg (ref 26.0–34.0)
MCHC: 31.5 g/dL — AB (ref 32.0–36.0)
MCV: 83.6 fL (ref 80.0–100.0)
PLATELETS: 123 10*3/uL — AB (ref 150–440)
RBC: 2.89 MIL/uL — ABNORMAL LOW (ref 4.40–5.90)
RDW: 23 % — ABNORMAL HIGH (ref 11.5–14.5)
WBC: 10.7 10*3/uL — ABNORMAL HIGH (ref 3.8–10.6)

## 2014-10-07 LAB — TROPONIN I: TROPONIN I: 0.04 ng/mL — AB (ref <0.031)

## 2014-10-07 LAB — COMPREHENSIVE METABOLIC PANEL
ALBUMIN: 1.8 g/dL — AB (ref 3.5–5.0)
ALT: 12 U/L — ABNORMAL LOW (ref 17–63)
ANION GAP: 4 — AB (ref 5–15)
AST: 22 U/L (ref 15–41)
Alkaline Phosphatase: 47 U/L (ref 38–126)
BILIRUBIN TOTAL: 0.3 mg/dL (ref 0.3–1.2)
BUN: 12 mg/dL (ref 6–20)
CO2: 26 mmol/L (ref 22–32)
Calcium: 7.7 mg/dL — ABNORMAL LOW (ref 8.9–10.3)
Chloride: 106 mmol/L (ref 101–111)
Creatinine, Ser: 0.73 mg/dL (ref 0.61–1.24)
GFR calc Af Amer: >60 mL/min (ref >60)
GFR calc non Af Amer: >60 mL/min (ref >60)
GLUCOSE: 157 mg/dL — AB (ref 65–99)
POTASSIUM: 3.4 mmol/L — AB (ref 3.5–5.1)
Sodium: 136 mmol/L (ref 135–145)
TOTAL PROTEIN: 5.5 g/dL — AB (ref 6.5–8.1)

## 2014-10-07 LAB — PROTIME-INR
INR: 1.83
PROTHROMBIN TIME: 21.3 s — AB (ref 11.4–15.0)

## 2014-10-07 LAB — DIFFERENTIAL
Basophils Absolute: 0 10*3/uL (ref 0–0.1)
Basophils Relative: 0 %
EOS ABS: 0 10*3/uL (ref 0–0.7)
EOS PCT: 0 %
LYMPHS ABS: 0.9 10*3/uL — AB (ref 1.0–3.6)
Lymphocytes Relative: 8 %
Monocytes Absolute: 0.2 10*3/uL (ref 0.2–1.0)
Monocytes Relative: 2 %
NEUTROS PCT: 90 %
Neutro Abs: 9.6 10*3/uL — ABNORMAL HIGH (ref 1.4–6.5)

## 2014-10-07 LAB — GLUCOSE, CAPILLARY
GLUCOSE-CAPILLARY: 191 mg/dL — AB (ref 65–99)
GLUCOSE-CAPILLARY: 137 mg/dL — AB (ref 65–99)

## 2014-10-07 LAB — APTT: aPTT: 32 seconds (ref 24–36)

## 2014-10-07 MED ORDER — ASPIRIN 81 MG PO CHEW: 324.0000 mg | CHEWABLE_TABLET | Freq: Once | ORAL | Status: DC

## 2014-10-07 MED ORDER — ASPIRIN 300 MG RE SUPP
300.0000 mg | Freq: Once | RECTAL | Status: AC
  Administered 2014-10-07: 300 mg via RECTAL
  Filled 2014-10-07: qty 1

## 2014-10-07 NOTE — ED Provider Notes (Signed)
[facility] Emergency Department Provider Note  ____________________________________________  Time seen: Approximately 9:56 PM  I have reviewed the triage vital signs and the nursing notes.   HISTORY  Chief Complaint Facial Droop    HPI Harry Roman is a 69 y.o. male history of metastatic pancreatic cancer. He comes in for evaluation of drooping on the right side of the face that is been ongoing for approximately 3 days. Patient and the wife reports that he fell off of his commode, and struck the right side of his head and had a small amount of bleeding and laceration around the right tongue. This went away, but since that time he's been having a drooping in the right face and reports he feels like he has some trouble swallowing.  No pain or other injury. Denies any numbness. Her chest pain or trouble breathing. No neck pain. Did not pass out.   Past Medical History  Diagnosis Date  . Hypertension   . Diabetes mellitus without complication   . Hyperlipidemia   . Cancer 2016    pancreas  . Ascites 2016  . 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 Neuropathy 09/30/2014    Patient Active Problem List   Diagnosis Date Noted  . Neuropathy 09/30/2014  . Hypomagnesemia 08/24/2014  . Ascites 07/27/2014  . Hypokalemia 07/27/2014  . Cancer 07/13/2014  . Bilateral lower extremity edema 07/13/2014  . B12 deficiency 06/29/2014  . 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 Hypothyroidism 05/22/2014  . Hypertension 05/22/2014  . Hyperlipemia 05/22/2014  . Diabetes mellitus, type 2 05/22/2014    Past Surgical History  Procedure Laterality Date  . Hemorrhoid surgery    . Colonoscopy  Jan 2015    Dr Donnella Sham  . Peritoneal fluid aspiration  2016    Current Outpatient Rx  Name  Route  Sig  Dispense  Refill  . aspirin 81 MG tablet   Oral   Take 81 mg by mouth daily.         . carvedilol (COREG) 12.5 MG tablet   Oral   Take 12.5  mg by mouth 2 (two) times daily with a meal.         . enalapril (VASOTEC) 10 MG tablet               . enalapril (VASOTEC) 20 MG tablet   Oral   Take 20 mg by mouth daily.         . insulin regular (NOVOLIN R,HUMULIN R) 100 units/mL injection      Sliding Scale  Glucose 200-300 take 2 units Glucose 301-400 take 4 units  Glucose 401-500 take 6 units   10 mL   1   . levothyroxine (SYNTHROID, LEVOTHROID) 50 MCG tablet   Oral   Take 50 mcg by mouth daily before breakfast.         . lovastatin (MEVACOR) 10 MG tablet   Oral   Take 10 mg by mouth at bedtime.         . mirtazapine (REMERON) 15 MG tablet   Oral   Take 15 mg by mouth every evening.      0   . nystatin (MYCOSTATIN) 100000 UNIT/ML suspension                 Allergies Review of patient's allergies indicates no known allergies.  No family history on file.  Social History Social History  Substance Use Topics  . Smoking status:  Never Smoker   . Smokeless tobacco: None  . Alcohol Use: No    Review of Systems Constitutional: No fever/chills Eyes: No visual changes. ENT: No sore throat. Cardiovascular: Denies chest pain. Respiratory: Denies shortness of breath. Gastrointestinal: No abdominal pain.  No nausea, no vomiting.  No diarrhea.  No constipation. Genitourinary: Negative for dysuria. Musculoskeletal: Negative for back pain. Skin: Negative for rash. Neurological: Negative for headaches, focal weakness except on the right face or numbness.  10-point ROS otherwise negative.  ____________________________________________   PHYSICAL EXAM:  VITAL SIGNS: ED Triage Vitals  Enc Vitals Group     BP 10/07/14 1701 145/116 mmHg     Pulse Rate 10/07/14 1701 73     Resp 10/07/14 1701 18     Temp 10/07/14 1701 99.7 F (37.6 C)     Temp Source 10/07/14 1701 Oral     SpO2 10/07/14 1701 98 %     Weight 10/07/14 1701 130 lb (58.968 kg)     Height 10/07/14 1701 5\' 7"  (1.702 m)     Head  Cir --      Peak Flow --      Pain Score --      Pain Loc --      Pain Edu? --      Excl. in [place]? --     Constitutional: Alert and oriented. Chronically ill-appearing appearing and in no acute distress. Eyes: Conjunctivae are normal. PERRL. EOMI. Head: Atraumatic. Nose: No congestion/rhinnorhea. Mouth/Throat: Mucous membranes are moist.  Oropharynx non-erythematous. Neck: No stridor.  No cervical spine tenderness Cardiovascular: Normal rate, regular rhythm. Grossly normal heart sounds.  Good peripheral circulation. Respiratory: Normal respiratory effort.  No retractions. Lungs CTAB. Gastrointestinal: Soft and nontender. No distention. No abdominal bruits. No CVA tenderness. Musculoskeletal: No lower extremity tenderness nor edema.  No joint effusions. Neurologic:  Speech is somewhat thick, the patient has right-sided facial paralysis that does not appear to involve the right temporalis. He is unable to versus lips on the right. Tongue protrudes midline. Denies loss of sensation over the right face. Extraocular movements intact. Moves all extremity is 5 out of 5 without pronator drift. Skin:  Skin is warm, dry and intact. No rash noted. Psychiatric: Mood and affect are normal. Speech and behavior are normal.   ____________________________________________   LABS (all labs ordered are listed, but only abnormal results are displayed)  Labs Reviewed  PROTIME-INR - Abnormal; Notable for the following:    Prothrombin Time 21.3 (*)    All other components within normal limits  CBC - Abnormal; Notable for the following:    WBC 10.7 (*)    RBC 2.89 (*)    Hemoglobin 7.6 (*)    HCT 24.2 (*)    MCHC 31.5 (*)    RDW 23.0 (*)    Platelets 123 (*)    All other components within normal limits  DIFFERENTIAL - Abnormal; Notable for the following:    Neutro Abs 9.6 (*)    Lymphs Abs 0.9 (*)    All other components within normal limits  COMPREHENSIVE METABOLIC PANEL - Abnormal; Notable for the  following:    Potassium 3.4 (*)    Glucose, Bld 157 (*)    Calcium 7.7 (*)    Total Protein 5.5 (*)    Albumin 1.8 (*)    ALT 12 (*)    Anion gap 4 (*)    All other components within normal limits  TROPONIN I - Abnormal; Notable for the following:  Troponin I 0.04 (*)    All other components within normal limits  GLUCOSE, CAPILLARY - Abnormal; Notable for the following:    Glucose-Capillary 191 (*)    All other components within normal limits  APTT  I-STAT TROPOININ, ED  CBG MONITORING, ED  I-STAT CHEM 8, ED  CBG MONITORING, ED   ____________________________________________  EKG  Reviewed and interpreted by me Normal sinus rhythm Ventricular rate 73 PR 146 QRS 112 QTc 4:30 Reviewed and interpreted as normal sinus rhythm, left bundle branch block  2. His previous EKG, left bundle branch block is old ____________________________________________  RADIOLOGY  CT HEAD FINDINGS  No mass lesion, mass effect, midline shift, hydrocephalus, hemorrhage. No acute territorial cortical ischemia/infarct. Atrophy and chronic ischemic white matter disease is present. The calvarium is intact. No destructive skull lesions are identified.  CT MAXILLOFACIAL FINDINGS  Globes: Intact  Bony orbits: Intact.  Pterygoid plates: Intact.  Mandibular condyles: Located.  Mandible: Intact.  Intracranial contents: See above.  Paranasal sinuses: Clear.  Mastoid air cells: Normal.  Nasal bones: Intact.  Soft tissues: Normal for noncontrast CT.  Visible cervical spine: Severe cervical spondylosis. Tract calcification along the cruciform ligament of the atlas. Heterogeneous mineralization probably secondary to osteopenia rather than diffuse bony metastatic disease.  IMPRESSION: 1. Atrophy and chronic ischemic white matter disease without acute intracranial abnormality. 2. Negative for facial  fracture. ____________________________________________   PROCEDURES  Procedure(s) performed: None  Critical Care performed: No  ____________________________________________   INITIAL IMPRESSION / ASSESSMENT AND PLAN / ED COURSE  Pertinent labs & imaging results that were available during my care of the patient were reviewed by me and considered in my medical decision making (see chart for details).  Patient presents after a fall on Wednesday. Exam is concerning for what appears to be subacute right facial deficit involving the right seventh cranial nerve. This does spare the temporalis raising suspicion and concern for possible acute stroke or in the setting of his metastatic disease possible small metastases that is not seen on CT.  Patient also has risk factors for neuropathy, including diabetes. He has no acute cardiopulmonary symptoms. No neck pain. CT demonstrates no acute traumatic injury of the face. I do not find any evidence of injury on exam of the oropharynx or face. I am concerned mostly about the possibility acute stroke or metastases but patient does have a clear cranial nerve deficit.  Discussed with neurology Drs. eloquent, and based on the patient's history we will admit him for MRI of the brain which Dr. Irish Elders recommends MRI with and without contrast to evaluate for stroke and metastases. Patient and his wife are agreeable with this plan. ____________________________________________   FINAL CLINICAL IMPRESSION(S) / ED DIAGNOSES  Final diagnoses:  Facial droop due to stroke  Ischemic stroke      Delman Kitten, MD 10/07/14 2211

## 2014-10-07 NOTE — ED Notes (Signed)
CbG 191

## 2014-10-07 NOTE — H&P (Signed)
[facility] NAME: Harry Roman    MR#:  [id number]  DATE OF BIRTH:  1946-01-05  DATE OF ADMISSION:  10/07/2014  PRIMARY CARE PHYSICIAN: Marguerita Merles, MD   REQUESTING/REFERRING PHYSICIAN: Quale  CHIEF COMPLAINT:   Chief Complaint  Patient presents with  . Facial Droop    HISTORY OF PRESENT ILLNESS:  Harry Roman  is a 69 y.o. male with a known history of metastatic pancreatic cancer, hypertension, diabetes mellitus type 2, hyperlipidemia, hypothyroidism presents to the emergency room with the complaints of right-sided facial droop noticed 3 days ago. According to the patient's wife, patient fell 3 days ago and hit his face and later on he was noticed to have facial asymmetry on the right side with droop. Denies any focal weakness of numbness another course of the body. Does have some swallowing difficulties on and off. Denies any chest pain, shortness of breath, dizziness, loss of consciousness, fever, chills, nausea, vomiting, diarrhea , constipation. No bladder or bowel disturbances. Evaluation in the ED revealed right-sided facial droop. CT of the head reported negative for any acute intracranial pathology. CT of the maxillofacial region negative for fracture. Lab work with potassium 3.4, troponin 0.04. H&H 7.6/24.2. EKG normal sinus rhythm with ventricular rate of 73 bpm, LBBB. Neurology on call was consulted by the ED physician who recommended admission and advised MRI of brain with and without contrast. Hospitalist service was consulted for further management. Patient is comfortably resting in the bed, denies any new complaints. Patient is currently undergoing chemotherapy, last chemotherapy on Thursday this week.  PAST MEDICAL HISTORY:   Past Medical History  Diagnosis Date  . Hypertension   . Diabetes mellitus without complication   . Hyperlipidemia   . Cancer 2016    pancreas  . Ascites 2016  . 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 Neuropathy 09/30/2014  . Hypothyroidism     PAST SURGICAL HISTORY:   Past Surgical History  Procedure Laterality Date  . Hemorrhoid surgery    . Colonoscopy  Jan 2015    Dr Donnella Sham  . Peritoneal fluid aspiration  2016    SOCIAL HISTORY:   Social History  Substance Use Topics  . Smoking status: Never Smoker   . Smokeless tobacco: Not on file  . Alcohol Use: No    FAMILY HISTORY:   Family History  Problem Relation Age of Onset  . Hypertension Mother   . Diabetes Mother   . Diabetes Sister   . Diabetes Brother     DRUG ALLERGIES:  No Known Allergies  REVIEW OF SYSTEMS:   Review of Systems  Constitutional: Positive for malaise/fatigue. Negative for fever and chills.  HENT: Negative for ear pain, hearing loss, nosebleeds, sore throat and tinnitus.   Eyes: Negative for blurred vision, double vision, pain, discharge and redness.  Respiratory: Negative for cough, hemoptysis, sputum production, shortness of breath and wheezing.   Cardiovascular: Negative for chest pain, palpitations, orthopnea and leg swelling.  Gastrointestinal: Negative for nausea, vomiting, abdominal pain, diarrhea, constipation, blood in stool and melena.  Genitourinary: Negative for dysuria, urgency, frequency and hematuria.  Musculoskeletal: Negative for back pain, joint pain and neck pain.  Skin: Negative for itching and rash.  Neurological: Negative for dizziness, tingling, sensory change, focal weakness and seizures.       Right-sided facial droop noticed 3 days ago as noted in history of present illness.  Endo/Heme/Allergies: Does not bruise/bleed easily.  Psychiatric/Behavioral:  Negative for depression. The patient is not nervous/anxious.     MEDICATIONS AT HOME:   Prior to Admission medications   Medication Sig Start Date End Date Taking? Authorizing Provider  aspirin 81 MG tablet Take 81 mg by mouth daily.   Yes Historical Provider, MD  carvedilol (COREG)  12.5 MG tablet Take 12.5 mg by mouth 2 (two) times daily with a meal.   Yes Historical Provider, MD  enalapril (VASOTEC) 10 MG tablet  09/06/14  Yes Historical Provider, MD  enalapril (VASOTEC) 20 MG tablet Take 20 mg by mouth daily.   Yes Historical Provider, MD  insulin regular (NOVOLIN R,HUMULIN R) 100 units/mL injection Sliding Scale  Glucose 200-300 take 2 units Glucose 301-400 take 4 units  Glucose 401-500 take 6 units 08/25/14 08/25/15 Yes Lisa Roca, MD  KLOR-CON M20 20 MEQ tablet Take 1 tablet by mouth 2 (two) times daily. 09/21/14  Yes Historical Provider, MD  levothyroxine (SYNTHROID, LEVOTHROID) 50 MCG tablet Take 50 mcg by mouth daily before breakfast.   Yes Historical Provider, MD  lovastatin (MEVACOR) 10 MG tablet Take 10 mg by mouth at bedtime.   Yes Historical Provider, MD      VITAL SIGNS:  Blood pressure 122/77, pulse 69, temperature 98.8 F (37.1 C), temperature source Oral, resp. rate 11, height 5\' 7"  (1.702 m), weight 58.968 kg (130 lb), SpO2 97 %.  PHYSICAL EXAMINATION:  Physical Exam  Constitutional: He is oriented to person, place, and time. No distress.  Chronically ill-looking.  HENT:  Head: Normocephalic and atraumatic.  Right Ear: External ear normal.  Left Ear: External ear normal.  Nose: Nose normal.  Mouth/Throat: Oropharynx is clear and moist. No oropharyngeal exudate.  Eyes: EOM are normal. Pupils are equal, round, and reactive to light. No scleral icterus.  Neck: Normal range of motion. Neck supple. No JVD present. No thyromegaly present.  Cardiovascular: Normal rate, regular rhythm, normal heart sounds and intact distal pulses.  Exam reveals no friction rub.   No murmur heard. Respiratory: Effort normal and breath sounds normal. No respiratory distress. He has no wheezes. He has no rales. He exhibits no tenderness.  GI: Soft. Bowel sounds are normal. He exhibits no distension and no mass. There is no tenderness. There is no rebound and no guarding.   Musculoskeletal: Normal range of motion. He exhibits no edema.  Lymphadenopathy:    He has no cervical adenopathy.  Neurological: He is alert and oriented to person, place, and time. He has normal reflexes. He displays normal reflexes. He exhibits normal muscle tone.  Right-sided facial droop +  Skin: Skin is warm. No rash noted. No erythema.  Psychiatric: He has a normal mood and affect. His behavior is normal. Thought content normal.   LABORATORY PANEL:   CBC  Recent Labs Lab 10/07/14 2006  WBC 10.7*  HGB 7.6*  HCT 24.2*  PLT 123*   ------------------------------------------------------------------------------------------------------------------  Chemistries   Recent Labs Lab 10/05/14 1031 10/07/14 2006  NA 133* 136  K 3.9 3.4*  CL 105 106  CO2 24 26  GLUCOSE 261* 157*  BUN 15 12  CREATININE 0.98 0.73  CALCIUM 7.5* 7.7*  MG 1.7  --   AST 24 22  ALT 13* 12*  ALKPHOS 62 47  BILITOT 0.3 0.3   ------------------------------------------------------------------------------------------------------------------  Cardiac Enzymes  Recent Labs Lab 10/07/14 2006  TROPONINI 0.04*   ------------------------------------------------------------------------------------------------------------------  RADIOLOGY:  Ct Head Wo Contrast  10/07/2014   CLINICAL DATA:  Facial droop. Fall Thursday. Facial trauma.  New RIGHT-sided facial droop. Patient currently undergoing treatment for pancreatic cancer.  EXAM: CT HEAD WITHOUT CONTRAST  CT MAXILLOFACIAL WITHOUT CONTRAST  TECHNIQUE: Multidetector CT imaging of the head and maxillofacial structures were performed using the standard protocol without intravenous contrast. Multiplanar CT image reconstructions of the maxillofacial structures were also generated.  COMPARISON:  None.  FINDINGS: CT HEAD FINDINGS  No mass lesion, mass effect, midline shift, hydrocephalus, hemorrhage. No acute territorial cortical ischemia/infarct. Atrophy and  chronic ischemic white matter disease is present. The calvarium is intact. No destructive skull lesions are identified.  CT MAXILLOFACIAL FINDINGS  Globes: Intact  Bony orbits:  Intact.  Pterygoid plates: Intact.  Mandibular condyles:  Located.  Mandible: Intact.  Intracranial contents:  See above.  Paranasal sinuses: Clear.  Mastoid air cells: Normal.  Nasal bones: Intact.  Soft tissues: Normal for noncontrast CT.  Visible cervical spine: Severe cervical spondylosis. Tract calcification along the cruciform ligament of the atlas. Heterogeneous mineralization probably secondary to osteopenia rather than diffuse bony metastatic disease.  IMPRESSION: 1. Atrophy and chronic ischemic white matter disease without acute intracranial abnormality. 2. Negative for facial fracture.   Electronically Signed   By: Dereck Ligas M.D.   On: 10/07/2014 18:18   Ct Maxillofacial Wo Cm  10/07/2014   CLINICAL DATA:  Facial droop. Fall Thursday. Facial trauma. New RIGHT-sided facial droop. Patient currently undergoing treatment for pancreatic cancer.  EXAM: CT HEAD WITHOUT CONTRAST  CT MAXILLOFACIAL WITHOUT CONTRAST  TECHNIQUE: Multidetector CT imaging of the head and maxillofacial structures were performed using the standard protocol without intravenous contrast. Multiplanar CT image reconstructions of the maxillofacial structures were also generated.  COMPARISON:  None.  FINDINGS: CT HEAD FINDINGS  No mass lesion, mass effect, midline shift, hydrocephalus, hemorrhage. No acute territorial cortical ischemia/infarct. Atrophy and chronic ischemic white matter disease is present. The calvarium is intact. No destructive skull lesions are identified.  CT MAXILLOFACIAL FINDINGS  Globes: Intact  Bony orbits:  Intact.  Pterygoid plates: Intact.  Mandibular condyles:  Located.  Mandible: Intact.  Intracranial contents:  See above.  Paranasal sinuses: Clear.  Mastoid air cells: Normal.  Nasal bones: Intact.  Soft tissues: Normal for  noncontrast CT.  Visible cervical spine: Severe cervical spondylosis. Tract calcification along the cruciform ligament of the atlas. Heterogeneous mineralization probably secondary to osteopenia rather than diffuse bony metastatic disease.  IMPRESSION: 1. Atrophy and chronic ischemic white matter disease without acute intracranial abnormality. 2. Negative for facial fracture.   Electronically Signed   By: Dereck Ligas M.D.   On: 10/07/2014 18:18    EKG:   Orders placed or performed during the hospital encounter of 10/07/14  . ED EKG  . ED EKG  Normal sinus rhythm with ventricular rate of 73 bpm, LBBB  IMPRESSION AND PLAN:   69 year old male with past medical history of hypertension, diabetes mellitus type II, hyperlipidemia, hypothyroidism, metastatic pancreatic cancer presents with the complaints of right-sided facial droop noticed 3 days ago following a mechanical fall. 1. Right-sided facial droop concerning for CVA. CT head negative for acute stroke/metastases. Neuro on call consulted by the ED physician and advised admission for further workup including MRI of the brain. Plan: Admit, neuro watch, telemetry monitoring, continue aspirin, place him nothing by mouth pending swallow evaluation, order MRI brain without and without contrast, echocardiogram, Carotid Dopplers. Neuro consultation requested for further advice. 2. Metastatic pancreatic cancer undergoing chemotherapy per oncology. No acute problems. Oncology consultation requested for further advice. 3. Hypertension, on multiple medications. We  will hold antimedicine medications for now for permissive BP because of CVA. Monitor blood pressure closely and restart medications accordingly. 4. Diabetes mellitus type 2, stable on home medications. We will hold home medications for now since patient is nothing by mouth. Sliding scale insulin, follow blood sugars. 5. Hyperlipidemia, stable on statin. Continue same. 6. Mildly elevated troponin.  No cardiac symptoms, no history of cardiac problems the past. Monitor, cycle cardiac enzymes and follow-up accordingly. 7. Hypothyroidism stable on Levoxyl. Continue same.    All the records are reviewed and case discussed with ED provider. Management plans discussed with the patient, family and they are in agreement.  CODE STATUS: Full code  TOTAL TIME TAKING CARE OF THIS PATIENT: 50 minutes.    Azucena Freed N M.D on 10/07/2014 at 11:46 PM  Between 7am to 6pm - Pager - [phone]  After 6pm go to [email or web address] - password [email or web address] [facility]  Office  [phone]  CC: Primary care physician; Marguerita Merles, MD

## 2014-10-07 NOTE — ED Notes (Signed)
Notified MD of elevated troponin of 0.04

## 2014-10-07 NOTE — ED Notes (Signed)
Pt from CT to room15 at this time

## 2014-10-07 NOTE — ED Notes (Addendum)
Wife reports pt fell Wednesday night and hit the side of his face portable toilet. Wife reports she " thinks his jaw is broken" she first noticed a lot of blood from under his dentures. wife states that he had food fall out of his mouth while he was eating thursday. Facial droop noted to right side that wife states is not normal. Denies slurred speech. Confused at time which is baseline. Current pancreatic cancer pt last treatment Thursday.

## 2014-10-07 NOTE — ED Notes (Signed)
CBG 137 

## 2014-10-08 ENCOUNTER — Inpatient Hospital Stay: Payer: Medicare Other

## 2014-10-08 ENCOUNTER — Inpatient Hospital Stay
Admit: 2014-10-08 | Discharge: 2014-10-08 | Disposition: A | Discharge status: Home / Self Care | Payer: Medicare Other | Attending: Internal Medicine | Admitting: Internal Medicine

## 2014-10-08 DIAGNOSIS — R1310 Dysphagia, unspecified: Secondary | ICD-10-CM

## 2014-10-08 DIAGNOSIS — Z9221 Personal history of antineoplastic chemotherapy: Secondary | ICD-10-CM

## 2014-10-08 DIAGNOSIS — R29810 Facial weakness: Secondary | ICD-10-CM

## 2014-10-08 DIAGNOSIS — Z79899 Other long term (current) drug therapy: Secondary | ICD-10-CM

## 2014-10-08 DIAGNOSIS — C259 Malignant neoplasm of pancreas, unspecified: Secondary | ICD-10-CM

## 2014-10-08 DIAGNOSIS — Z9181 History of falling: Secondary | ICD-10-CM

## 2014-10-08 DIAGNOSIS — Z7982 Long term (current) use of aspirin: Secondary | ICD-10-CM

## 2014-10-08 DIAGNOSIS — C799 Secondary malignant neoplasm of unspecified site: Secondary | ICD-10-CM

## 2014-10-08 DIAGNOSIS — R5383 Other fatigue: Secondary | ICD-10-CM

## 2014-10-08 DIAGNOSIS — E119 Type 2 diabetes mellitus without complications: Secondary | ICD-10-CM

## 2014-10-08 DIAGNOSIS — Z794 Long term (current) use of insulin: Secondary | ICD-10-CM

## 2014-10-08 LAB — GLUCOSE, CAPILLARY
GLUCOSE-CAPILLARY: 89 mg/dL (ref 65–99)
GLUCOSE-CAPILLARY: 86 mg/dL (ref 65–99)
GLUCOSE-CAPILLARY: 64 mg/dL — AB (ref 65–99)
Glucose-Capillary: 118 mg/dL — ABNORMAL HIGH (ref 65–99)
Glucose-Capillary: 79 mg/dL (ref 65–99)
Glucose-Capillary: 80 mg/dL (ref 65–99)
Glucose-Capillary: 81 mg/dL (ref 65–99)

## 2014-10-08 LAB — TROPONIN I
Troponin I: 0.03 ng/mL (ref <0.031)
Troponin I: 0.03 ng/mL (ref <0.031)

## 2014-10-08 MED ORDER — ACETAMINOPHEN 325 MG PO TABS: 650.0000 mg | ORAL_TABLET | Freq: Four times a day (QID) | ORAL | Status: DC | PRN

## 2014-10-08 MED ORDER — PRAVASTATIN SODIUM 20 MG PO TABS: 20.0000 mg | ORAL_TABLET | Freq: Every day | ORAL | Status: DC

## 2014-10-08 MED ORDER — SODIUM CHLORIDE 0.9 % IV SOLN
INTRAVENOUS | Status: DC
  Administered 2014-10-08 – 2014-10-09 (×2): via INTRAVENOUS

## 2014-10-08 MED ORDER — ASPIRIN EC 81 MG PO TBEC: 81.0000 mg | DELAYED_RELEASE_TABLET | Freq: Every day | ORAL | Status: DC

## 2014-10-08 MED ORDER — INSULIN ASPART 100 UNIT/ML ~~LOC~~ SOLN
0.0000 [IU] | SUBCUTANEOUS | Status: DC
  Administered 2014-10-09: 13:00:00 5 [IU] via SUBCUTANEOUS
  Filled 2014-10-08: qty 5

## 2014-10-08 MED ORDER — ONDANSETRON HCL 4 MG/2ML IJ SOLN: 4.0000 mg | Freq: Four times a day (QID) | INTRAMUSCULAR | Status: DC | PRN

## 2014-10-08 MED ORDER — ONDANSETRON HCL 4 MG PO TABS: 4.0000 mg | ORAL_TABLET | Freq: Four times a day (QID) | ORAL | Status: DC | PRN

## 2014-10-08 MED ORDER — SODIUM CHLORIDE 0.9 % IV SOLN
INTRAVENOUS | Status: DC
  Administered 2014-10-08: 01:00:00 via INTRAVENOUS

## 2014-10-08 MED ORDER — POTASSIUM CHLORIDE CRYS ER 20 MEQ PO TBCR
20.0000 meq | EXTENDED_RELEASE_TABLET | Freq: Two times a day (BID) | ORAL | Status: DC
  Administered 2014-10-09: 09:00:00 20 meq via ORAL
  Filled 2014-10-08 (×2): qty 1

## 2014-10-08 MED ORDER — ENOXAPARIN SODIUM 40 MG/0.4ML ~~LOC~~ SOLN
40.0000 mg | SUBCUTANEOUS | Status: DC
  Administered 2014-10-08 – 2014-10-09 (×2): 40 mg via SUBCUTANEOUS
  Filled 2014-10-08 (×2): qty 0.4

## 2014-10-08 MED ORDER — SODIUM CHLORIDE 0.9 % IJ SOLN
3.0000 mL | Freq: Two times a day (BID) | INTRAMUSCULAR | Status: DC
Start: 2014-10-08 — End: 2014-10-09

## 2014-10-08 MED ORDER — ASPIRIN 300 MG RE SUPP
300.0000 mg | Freq: Every day | RECTAL | Status: DC
  Administered 2014-10-08: 12:00:00 300 mg via RECTAL
  Filled 2014-10-08 (×2): qty 1

## 2014-10-08 MED ORDER — LEVOTHYROXINE SODIUM 50 MCG PO TABS
50.0000 ug | ORAL_TABLET | Freq: Every day | ORAL | Status: DC
  Administered 2014-10-09: 50 ug via ORAL
  Filled 2014-10-08: qty 1

## 2014-10-08 MED ORDER — ACETAMINOPHEN 650 MG RE SUPP
650.0000 mg | Freq: Four times a day (QID) | RECTAL | Status: DC | PRN
Start: 2014-10-08 — End: 2014-10-09

## 2014-10-08 MED ORDER — GADOBENATE DIMEGLUMINE 529 MG/ML IV SOLN
15.0000 mL | Freq: Once | INTRAVENOUS | Status: AC | PRN
  Administered 2014-10-08: 11 mL via INTRAVENOUS

## 2014-10-08 NOTE — Consult Note (Signed)
CC: F facial droop   HPI: Harry Roman is an 69 y.o. male with metastatic pancreatic cancer who was admitted through the emergency room with right facial droop and inability to close his R eye that has been going on for the past 3 days.  MRI no acute abnormality seen   Past Medical History  Diagnosis Date  . Hypertension   . Diabetes mellitus without complication   . Hyperlipidemia   . Cancer 2016    pancreas  . Ascites 2016  . 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 Neuropathy 09/30/2014  . Hypothyroidism     Past Surgical History  Procedure Laterality Date  . Hemorrhoid surgery    . Colonoscopy  Jan 2015    Dr Donnella Sham  . Peritoneal fluid aspiration  2016    Family History  Problem Relation Age of Onset  . Hypertension Mother   . Diabetes Mother   . Diabetes Sister   . Diabetes Brother     Social History:  reports that he has never smoked. He does not have any smokeless tobacco history on file. He reports that he does not drink alcohol or use illicit drugs.  No Known Allergies  Medications: I have reviewed the patient's current medications.  ROS: History obtained from chart review  General ROS: negative for - chills, fatigue, fever, night sweats, weight gain or weight loss Psychological ROS: negative for - behavioral disorder, hallucinations, memory difficulties, mood swings or suicidal ideation Ophthalmic ROS: negative for - blurry vision, double vision, eye pain or loss of vision ENT ROS: negative for - epistaxis, nasal discharge, oral lesions, sore throat, tinnitus or vertigo Allergy and Immunology ROS: negative for - hives or itchy/watery eyes Hematological and Lymphatic ROS: negative for - bleeding problems, bruising or swollen lymph nodes Endocrine ROS: negative for - galactorrhea, hair pattern changes, polydipsia/polyuria or temperature intolerance Respiratory ROS: negative for - cough, hemoptysis, shortness of breath or  wheezing Cardiovascular ROS: negative for - chest pain, dyspnea on exertion, edema or irregular heartbeat Gastrointestinal ROS: negative for - abdominal pain, diarrhea, hematemesis, nausea/vomiting or stool incontinence Genito-Urinary ROS: negative for - dysuria, hematuria, incontinence or urinary frequency/urgency Musculoskeletal ROS: negative for - joint swelling or muscular weakness Neurological ROS: as noted in HPI Dermatological ROS: negative for rash and skin lesion changes  Physical Examination: Blood pressure 136/66, pulse 59, temperature 98.3 F (36.8 C), temperature source Oral, resp. rate 18, height 5\' 7"  (1.702 m), weight 58.605 kg (129 lb 3.2 oz), SpO2 100 %.    Neurological Examination Mental Status: Alert, oriented, Cranial Nerves: II: Discs flat bilaterally; Visual fields grossly normal, pupils equal, round, reactive to light and accommodation III,IV, VI: ptosis not present, extra-ocular motions intact bilaterally V,VII: R facial droop and inability to close R eye.  VIII: hearing normal bilaterally IX,X: gag reflex present XI: bilateral shoulder shrug XII: midline tongue extension Motor: Right : Upper extremity   5/5    Left:     Upper extremity   5/5  Lower extremity   5/5     Lower extremity   5/5 Tone and bulk:normal tone throughout; no atrophy noted Sensory: Pinprick and light touch intact throughout, bilaterally Deep Tendon Reflexes: 1+ and symmetric throughout Plantars: Right: downgoing   Left: downgoing Cerebellar: normal finger-to-nose, normal rapid alternating movements and normal heel-to-shin test Gait: not tested.      Laboratory Studies:   Basic Metabolic Panel:  Recent Labs Lab 10/05/14 1031 10/07/14 2006  NA 133*  136  K 3.9 3.4*  CL 105 106  CO2 24 26  GLUCOSE 261* 157*  BUN 15 12  CREATININE 0.98 0.73  CALCIUM 7.5* 7.7*  MG 1.7  --     Liver Function Tests:  Recent Labs Lab 10/05/14 1031 10/07/14 2006  AST 24 22  ALT 13*  12*  ALKPHOS 62 47  BILITOT 0.3 0.3  PROT 6.1* 5.5*  ALBUMIN 1.9* 1.8*   No results for input(s): LIPASE, AMYLASE in the last 168 hours. No results for input(s): AMMONIA in the last 168 hours.  CBC:  Recent Labs Lab 10/05/14 1031 10/07/14 2006  WBC 8.3 10.7*  NEUTROABS 6.0 9.6*  HGB 8.9* 7.6*  HCT 27.8* 24.2*  MCV 83.0 83.6  PLT 203 123*    Cardiac Enzymes:  Recent Labs Lab 10/07/14 2006 10/08/14 0045 10/08/14 0544  TROPONINI 0.04* 0.03 0.03    BNP: Invalid input(s): POCBNP  CBG:  Recent Labs Lab 10/07/14 2216 10/08/14 0039 10/08/14 0428 10/08/14 0816 10/08/14 1153  GLUCAP 137* 118* 89 86 80    Microbiology: No results found for this or any previous visit.  Coagulation Studies:  Recent Labs  10/07/14 2006  LABPROT 21.3*  INR 1.83    Urinalysis: No results for input(s): COLORURINE, LABSPEC, PHURINE, GLUCOSEU, HGBUR, BILIRUBINUR, KETONESUR, PROTEINUR, UROBILINOGEN, NITRITE, LEUKOCYTESUR in the last 168 hours.  Invalid input(s): APPERANCEUR  Lipid Panel:  No results found for: CHOL, TRIG, HDL, CHOLHDL, VLDL, LDLCALC  HgbA1C: No results found for: HGBA1C  Urine Drug Screen:  No results found for: LABOPIA, COCAINSCRNUR, LABBENZ, AMPHETMU, THCU, LABBARB  Alcohol Level: No results for input(s): ETH in the last 168 hours.  Other results: EKG: normal EKG, normal sinus rhythm, unchanged from previous tracings.  Imaging: Ct Head Wo Contrast  10/07/2014   CLINICAL DATA:  Facial droop. Fall Thursday. Facial trauma. New RIGHT-sided facial droop. Patient currently undergoing treatment for pancreatic cancer.  EXAM: CT HEAD WITHOUT CONTRAST  CT MAXILLOFACIAL WITHOUT CONTRAST  TECHNIQUE: Multidetector CT imaging of the head and maxillofacial structures were performed using the standard protocol without intravenous contrast. Multiplanar CT image reconstructions of the maxillofacial structures were also generated.  COMPARISON:  None.  FINDINGS: CT HEAD  FINDINGS  No mass lesion, mass effect, midline shift, hydrocephalus, hemorrhage. No acute territorial cortical ischemia/infarct. Atrophy and chronic ischemic white matter disease is present. The calvarium is intact. No destructive skull lesions are identified.  CT MAXILLOFACIAL FINDINGS  Globes: Intact  Bony orbits:  Intact.  Pterygoid plates: Intact.  Mandibular condyles:  Located.  Mandible: Intact.  Intracranial contents:  See above.  Paranasal sinuses: Clear.  Mastoid air cells: Normal.  Nasal bones: Intact.  Soft tissues: Normal for noncontrast CT.  Visible cervical spine: Severe cervical spondylosis. Tract calcification along the cruciform ligament of the atlas. Heterogeneous mineralization probably secondary to osteopenia rather than diffuse bony metastatic disease.  IMPRESSION: 1. Atrophy and chronic ischemic white matter disease without acute intracranial abnormality. 2. Negative for facial fracture.   Electronically Signed   By: Dereck Ligas M.D.   On: 10/07/2014 18:18   Mr Jeri Cos PY Contrast  10/08/2014   CLINICAL DATA:  Facial droop beginning 3 days ago. Fall with trauma to the head 3 days ago. Facial droop is right-sided.  EXAM: MRI HEAD WITHOUT AND WITH CONTRAST  TECHNIQUE: Multiplanar, multiecho pulse sequences of the brain and surrounding structures were obtained without and with intravenous contrast.  CONTRAST:  3mL MULTIHANCE GADOBENATE DIMEGLUMINE 529 MG/ML IV SOLN  COMPARISON:  Head CT 10/07/2014  FINDINGS: Diffusion imaging does not show any acute or subacute infarction. The brainstem and cerebellum are normal. The cerebral hemispheres show mild generalized atrophy with mild chronic small-vessel ischemic changes of the white matter. No cortical or large vessel territory infarction. No mass lesion, hemorrhage, hydrocephalus or extra-axial collection. No abnormal contrast enhancement. No abnormality seen to explain right-sided facial droop.  IMPRESSION: No acute finding. Mild age related  atrophy and chronic small vessel disease. No evidence of acute infarction or other cause of right facial droop.   Electronically Signed   By: Nelson Chimes M.D.   On: 10/08/2014 13:37   Ct Maxillofacial Wo Cm  10/07/2014   CLINICAL DATA:  Facial droop. Fall Thursday. Facial trauma. New RIGHT-sided facial droop. Patient currently undergoing treatment for pancreatic cancer.  EXAM: CT HEAD WITHOUT CONTRAST  CT MAXILLOFACIAL WITHOUT CONTRAST  TECHNIQUE: Multidetector CT imaging of the head and maxillofacial structures were performed using the standard protocol without intravenous contrast. Multiplanar CT image reconstructions of the maxillofacial structures were also generated.  COMPARISON:  None.  FINDINGS: CT HEAD FINDINGS  No mass lesion, mass effect, midline shift, hydrocephalus, hemorrhage. No acute territorial cortical ischemia/infarct. Atrophy and chronic ischemic white matter disease is present. The calvarium is intact. No destructive skull lesions are identified.  CT MAXILLOFACIAL FINDINGS  Globes: Intact  Bony orbits:  Intact.  Pterygoid plates: Intact.  Mandibular condyles:  Located.  Mandible: Intact.  Intracranial contents:  See above.  Paranasal sinuses: Clear.  Mastoid air cells: Normal.  Nasal bones: Intact.  Soft tissues: Normal for noncontrast CT.  Visible cervical spine: Severe cervical spondylosis. Tract calcification along the cruciform ligament of the atlas. Heterogeneous mineralization probably secondary to osteopenia rather than diffuse bony metastatic disease.  IMPRESSION: 1. Atrophy and chronic ischemic white matter disease without acute intracranial abnormality. 2. Negative for facial fracture.   Electronically Signed   By: Dereck Ligas M.D.   On: 10/07/2014 18:18     Assessment/Plan:  69 y.o. male with metastatic pancreatic cancer who was admitted through the emergency room with right facial droop and inability to close his R eye that has been going on for the past 3 days.  MRI no  acute abnormality seen   This is R sided Bell's Palsy - medrol taper pack for 5-7 days - d/c planning - f/up with oncology team.  10/08/2014, 2:37 PM

## 2014-10-08 NOTE — Progress Notes (Signed)
[facility] [place] NAME: Harry Roman    MR#:  [id number]  DATE OF BIRTH:  08-Feb-1946  SUBJECTIVE:  Right facial droop. No other focal weakness. Wants to eat but failed swallow eval per Rn  REVIEW OF SYSTEMS:   Review of Systems  Constitutional: Negative for fever, chills and weight loss.  HENT: Negative for ear discharge, ear pain and nosebleeds.   Eyes: Negative for blurred vision, pain and discharge.  Respiratory: Negative for sputum production, shortness of breath, wheezing and stridor.   Cardiovascular: Negative for chest pain, palpitations, orthopnea and PND.  Gastrointestinal: Negative for nausea, vomiting, abdominal pain and diarrhea.  Genitourinary: Negative for urgency and frequency.  Musculoskeletal: Negative for back pain and joint pain.  Neurological: Negative for sensory change, speech change, focal weakness and weakness.  Psychiatric/Behavioral: Negative for depression and hallucinations. The patient is not nervous/anxious.   All other systems reviewed and are negative.  Tolerating Diet:no Tolerating PT: pending  DRUG ALLERGIES:  No Known Allergies  VITALS:  Blood pressure 136/66, pulse 59, temperature 98.3 F (36.8 C), temperature source Oral, resp. rate 18, height 5\' 7"  (1.702 m), weight 58.605 kg (129 lb 3.2 oz), SpO2 100 %.  PHYSICAL EXAMINATION:   Physical Exam  GENERAL:  69 y.o.-year-old patient lying in the bed with no acute distress. Looks cachectic,thin EYES: Pupils equal, round, reactive to light and accommodation. No scleral icterus. Extraocular muscles intact.  HEENT: Head atraumatic, normocephalic. Oropharynx and nasopharynx clear. Right facial droop NECK:  Supple, no jugular venous distention. No thyroid enlargement, no tenderness.  LUNGS: Normal breath sounds bilaterally, no wheezing, rales, rhonchi. No use of accessory muscles of respiration.  CARDIOVASCULAR: S1, S2 normal. No murmurs, rubs,  or gallops.  ABDOMEN: Soft, nontender, nondistended. Bowel sounds present. No organomegaly or mass.  EXTREMITIES: No cyanosis, clubbing or edema b/l.    NEUROLOGIC: right facial droop No focal Motor or sensory deficits b/l.   PSYCHIATRIC:  alert and oriented x 3.  SKIN: No obvious rash, lesion, or ulcer.    LABORATORY PANEL:   CBC  Recent Labs Lab 10/07/14 2006  WBC 10.7*  HGB 7.6*  HCT 24.2*  PLT 123*    Chemistries   Recent Labs Lab 10/05/14 1031 10/07/14 2006  NA 133* 136  K 3.9 3.4*  CL 105 106  CO2 24 26  GLUCOSE 261* 157*  BUN 15 12  CREATININE 0.98 0.73  CALCIUM 7.5* 7.7*  MG 1.7  --   AST 24 22  ALT 13* 12*  ALKPHOS 62 47  BILITOT 0.3 0.3    Cardiac Enzymes  Recent Labs Lab 10/08/14 0544  TROPONINI 0.03    RADIOLOGY:  Ct Head Wo Contrast  10/07/2014   CLINICAL DATA:  Facial droop. Fall Thursday. Facial trauma. New RIGHT-sided facial droop. Patient currently undergoing treatment for pancreatic cancer.  EXAM: CT HEAD WITHOUT CONTRAST  CT MAXILLOFACIAL WITHOUT CONTRAST  TECHNIQUE: Multidetector CT imaging of the head and maxillofacial structures were performed using the standard protocol without intravenous contrast. Multiplanar CT image reconstructions of the maxillofacial structures were also generated.  COMPARISON:  None.  FINDINGS: CT HEAD FINDINGS  No mass lesion, mass effect, midline shift, hydrocephalus, hemorrhage. No acute territorial cortical ischemia/infarct. Atrophy and chronic ischemic white matter disease is present. The calvarium is intact. No destructive skull lesions are identified.  CT MAXILLOFACIAL FINDINGS  Globes: Intact  Bony orbits:  Intact.  Pterygoid plates: Intact.  Mandibular condyles:  Located.  Mandible: Intact.  Intracranial contents:  See above.  Paranasal sinuses: Clear.  Mastoid air cells: Normal.  Nasal bones: Intact.  Soft tissues: Normal for noncontrast CT.  Visible cervical spine: Severe cervical spondylosis. Tract  calcification along the cruciform ligament of the atlas. Heterogeneous mineralization probably secondary to osteopenia rather than diffuse bony metastatic disease.  IMPRESSION: 1. Atrophy and chronic ischemic white matter disease without acute intracranial abnormality. 2. Negative for facial fracture.   Electronically Signed   By: Dereck Ligas M.D.   On: 10/07/2014 18:18   Ct Maxillofacial Wo Cm  10/07/2014   CLINICAL DATA:  Facial droop. Fall Thursday. Facial trauma. New RIGHT-sided facial droop. Patient currently undergoing treatment for pancreatic cancer.  EXAM: CT HEAD WITHOUT CONTRAST  CT MAXILLOFACIAL WITHOUT CONTRAST  TECHNIQUE: Multidetector CT imaging of the head and maxillofacial structures were performed using the standard protocol without intravenous contrast. Multiplanar CT image reconstructions of the maxillofacial structures were also generated.  COMPARISON:  None.  FINDINGS: CT HEAD FINDINGS  No mass lesion, mass effect, midline shift, hydrocephalus, hemorrhage. No acute territorial cortical ischemia/infarct. Atrophy and chronic ischemic white matter disease is present. The calvarium is intact. No destructive skull lesions are identified.  CT MAXILLOFACIAL FINDINGS  Globes: Intact  Bony orbits:  Intact.  Pterygoid plates: Intact.  Mandibular condyles:  Located.  Mandible: Intact.  Intracranial contents:  See above.  Paranasal sinuses: Clear.  Mastoid air cells: Normal.  Nasal bones: Intact.  Soft tissues: Normal for noncontrast CT.  Visible cervical spine: Severe cervical spondylosis. Tract calcification along the cruciform ligament of the atlas. Heterogeneous mineralization probably secondary to osteopenia rather than diffuse bony metastatic disease.  IMPRESSION: 1. Atrophy and chronic ischemic white matter disease without acute intracranial abnormality. 2. Negative for facial fracture.   Electronically Signed   By: Dereck Ligas M.D.   On: 10/07/2014 18:18     ASSESSMENT AND PLAN:   69 year old male with past medical history of hypertension, diabetes mellitus type II, hyperlipidemia, hypothyroidism, metastatic pancreatic cancer presents with the complaints of right-sided facial droop noticed 3 days ago following a mechanical fall. 1. Right-sided facial droop concerning for CVA. CT head negative for acute stroke/metastases. Neuro on call consulted by the ED physician and advised admission for further workup including MRI of the brain. - neuro watch, telemetry monitoring, continue aspirin, place him nothing by mouth pending swallow evaluation, order MRI brain without and without contrast, echocardiogram, Carotid Dopplers. Neuro consultation requested for further advice. 2. Metastatic pancreatic cancer undergoing chemotherapy per oncology. No acute problems. Oncology consultation requested for further advice. 3. Hypertension, on multiple medications. We will hold antimedicine medications for now for permissive BP because of CVA. Monitor blood pressure closely and restart medications accordingly. 4. Diabetes mellitus type 2, stable on home medications. We will hold home medications for now since patient is nothing by mouth. Sliding scale insulin, follow blood sugars. 5. Hyperlipidemia, stable on statin. Continue same. 6. Mildly elevated troponin. No cardiac symptoms, no history of cardiac problems the past. Monitor, cycle cardiac enzymes and follow-up accordingly. 7. Hypothyroidism stable on Levoxyl. Continue same.   Case discussed with Care Management/Social Worker. Management plans discussed with the patient, family and they are in agreement.  CODE STATUS: full  DVT Prophylaxis:lovenox  TOTAL TIME TAKING CARE OF THIS PATIENT: 30 minutes.  >50% time spent on counselling and coordination of care  POSSIBLE D/C IN 1-2 DAYS, DEPENDING ON CLINICAL CONDITION.   Samamtha Tiegs M.D on 10/08/2014 at 10:58 AM  Between 7am  to 6pm - Pager - [phone]  After 6pm go to [email or web address]  - password [email or web address] [facility]  Office  [phone]  CC: Primary care physician; Marguerita Merles, MD

## 2014-10-08 NOTE — Progress Notes (Signed)
TC to advise Dr. Posey Pronto that pt is NPO due to failed swallow test with no speech therapist available on Sunday. Ask her to review meds and revise orders as needed. States she will do this.

## 2014-10-08 NOTE — Progress Notes (Signed)
   10/08/14 1800  Clinical Encounter Type  Visited With Patient;Family;Patient and family together  Visit Type Follow-up  Referral From Nurse  Spiritual Encounters  Spiritual Needs Literature  Stress Factors  Patient Stress Factors Health changes  Family Stress Factors None identified  Advance Directives (For Healthcare)  Does patient have an advance directive? No  Would patient like information on creating an advanced directive? Yes - Educational materials given  [employer] followed up with patient and family. After first two chaplain visits patient was not in room. Chaplain met with family and patient to discuss AD. Family filled out information and desires to complete form in morning. Chaplain Marcello Moores will refer to Chaplain assigned to floor Monday morning- 8/22. Marcello Moores

## 2014-10-08 NOTE — Progress Notes (Signed)
[facility] Hematology/Oncology Progress Note  Date of admission: 10/07/2014  Hospital day:  10/08/2014  Chief Complaint: Harry Roman is a 69 y.o. male with metastatic pancreatic cancer who was admitted through the emergency room with right sided facial droop worrisome for CVA.  HPI: The patient is currently undergoing chemotherapy for metastatic pancreatic cancer. His disease is responding to therapy.  He received gemcitabine and Abraxane on 10/05/2014. He tolerated his chemotherapy well.  Patient's wife notes that on Wednesday, 08/17, he fell and hit his face. Initially she thought he was fine at. She later noticed a right-sided facial droop. He has had trouble drinking out of a straw. He also had some intermittent swallowing difficulties. He has no other focal weakness or numbness.  Patient was seen in the emergency room yesterday.  Head CT and maxillofacial CT without contrast revealed no fracture or acute changes.  He was admitted for further evaluation of a possible stroke.  Social History: The patient is accompanied by his wife today.  Allergies: No Known Allergies  Scheduled Medications: . aspirin  300 mg Rectal Daily  . enoxaparin (LOVENOX) injection  40 mg Subcutaneous Q24H  . insulin aspart  0-15 Units Subcutaneous 6 times per day  . levothyroxine  50 mcg Oral QAC breakfast  . potassium chloride SA  20 mEq Oral BID  . pravastatin  20 mg Oral q1800  . sodium chloride  3 mL Intravenous Q12H    Review of Systems: GENERAL:  Feels at baseline.  General fatigue.  No fevers, sweats or weight loss. PERFORMANCE STATUS (ECOG):  2-3 HEENT:  No visual changes, runny nose, sore throat, mouth sores or tenderness. Lungs: No shortness of breath or cough.  No hemoptysis. Cardiac:  No chest pain, palpitations, orthopnea, or PND. GI:  No nausea, vomiting, diarrhea, constipation, melena or hematochezia. GU:  No urgency, frequency, dysuria, or  hematuria. Musculoskeletal:  No back pain.  No joint pain.  No muscle tenderness. Extremities:  No pain or swelling. Skin:  No rashes or skin changes. Neuro:  Right sided facial droop x 3 days.  No headache, numbness or weakness, balance or coordination issues. Endocrine:  No diabetes, thyroid issues, hot flashes or night sweats. Psych:  No mood changes, depression or anxiety. Pain:  No focal pain. Review of systems:  All other systems reviewed and found to be negative.  Physical Exam: Blood pressure 136/66, pulse 59, temperature 98.3 F (36.8 C), temperature source Oral, resp. rate 18, height 5' 7"  (1.702 m), weight 129 lb 3.2 oz (58.605 kg), SpO2 100 %.  GENERAL:  Elderly gentleman sitting comfortably on the medical unit in no acute distress. MENTAL STATUS:  Alert and oriented to person, place and time. HEAD:  Alopecia.  Right sided facial droop.  Normocephalic, atraumatic, no Cushingoid features. EYES:  Brown eyes.  Pupils equal round and reactive to light and accomodation.  No conjunctivitis or scleral icterus. ENT:  Slight right sided buccal mucosa thrush.  Tongue normal. Mucous membranes moist.  RESPIRATORY:  Clear to auscultation without rales, wheezes or rhonchi. CARDIOVASCULAR:  Regular rate and rhythm without murmur, rub or gallop. ABDOMEN:  Soft, non-tender, with active bowel sounds, and no hepatosplenomegaly.  No masses. SKIN:  No rashes, ulcers or lesions. EXTREMITIES: No edema, no skin discoloration or tenderness.  No palpable cords. LYMPH NODES: No palpable cervical, supraclavicular, axillary or inguinal adenopathy  NEUROLOGICAL: Alert & oriented, cranial nerves II-XII intact except for right sided facial droop (able to lift eyebrow and  wrinkle forehead); motor strength 5/5 throughout; sensation intact; finger to nose and RAM normal; no clonus or Babinski; gait not tested. PSYCH:  Appropriate.  Results for orders placed or performed during the hospital encounter of 10/07/14  (from the past 48 hour(s))  Glucose, capillary     Status: Abnormal   Collection Time: 10/07/14  5:35 PM  Result Value Ref Range   Glucose-Capillary 191 (H) 65 - 99 mg/dL  Protime-INR     Status: Abnormal   Collection Time: 10/07/14  8:06 PM  Result Value Ref Range   Prothrombin Time 21.3 (H) 11.4 - 15.0 seconds   INR 1.83   APTT     Status: None   Collection Time: 10/07/14  8:06 PM  Result Value Ref Range   aPTT 32 24 - 36 seconds  CBC     Status: Abnormal   Collection Time: 10/07/14  8:06 PM  Result Value Ref Range   WBC 10.7 (H) 3.8 - 10.6 K/uL   RBC 2.89 (L) 4.40 - 5.90 MIL/uL   Hemoglobin 7.6 (L) 13.0 - 18.0 g/dL   HCT 24.2 (L) 40.0 - 52.0 %   MCV 83.6 80.0 - 100.0 fL   MCH 26.3 26.0 - 34.0 pg   MCHC 31.5 (L) 32.0 - 36.0 g/dL   RDW 23.0 (H) 11.5 - 14.5 %   Platelets 123 (L) 150 - 440 K/uL  Differential     Status: Abnormal   Collection Time: 10/07/14  8:06 PM  Result Value Ref Range   Neutrophils Relative % 90 %   Neutro Abs 9.6 (H) 1.4 - 6.5 K/uL   Lymphocytes Relative 8 %   Lymphs Abs 0.9 (L) 1.0 - 3.6 K/uL   Monocytes Relative 2 %   Monocytes Absolute 0.2 0.2 - 1.0 K/uL   Eosinophils Relative 0 %   Eosinophils Absolute 0.0 0 - 0.7 K/uL   Basophils Relative 0 %   Basophils Absolute 0.0 0 - 0.1 K/uL  Comprehensive metabolic panel     Status: Abnormal   Collection Time: 10/07/14  8:06 PM  Result Value Ref Range   Sodium 136 135 - 145 mmol/L   Potassium 3.4 (L) 3.5 - 5.1 mmol/L   Chloride 106 101 - 111 mmol/L   CO2 26 22 - 32 mmol/L   Glucose, Bld 157 (H) 65 - 99 mg/dL   BUN 12 6 - 20 mg/dL   Creatinine, Ser 0.73 0.61 - 1.24 mg/dL   Calcium 7.7 (L) 8.9 - 10.3 mg/dL   Total Protein 5.5 (L) 6.5 - 8.1 g/dL   Albumin 1.8 (L) 3.5 - 5.0 g/dL   AST 22 15 - 41 U/L   ALT 12 (L) 17 - 63 U/L   Alkaline Phosphatase 47 38 - 126 U/L   Total Bilirubin 0.3 0.3 - 1.2 mg/dL   GFR calc non Af Amer >60 >60 mL/min   GFR calc Af Amer >60 >60 mL/min    Comment: (NOTE) The eGFR  has been calculated using the CKD EPI equation. This calculation has not been validated in all clinical situations. eGFR's persistently <60 mL/min signify possible Chronic Kidney Disease.    Anion gap 4 (L) 5 - 15  Troponin I     Status: Abnormal   Collection Time: 10/07/14  8:06 PM  Result Value Ref Range   Troponin I 0.04 (H) <0.031 ng/mL    Comment: READ BACK AND VERIFIED WITH AMANDA LOVETT ON 10/07/14 AT 1000PM BY TB.  PERSISTENTLY INCREASED TROPONIN VALUES IN THE RANGE OF 0.04-0.49 ng/mL CAN BE SEEN IN:       -UNSTABLE ANGINA       -CONGESTIVE HEART FAILURE       -MYOCARDITIS       -CHEST TRAUMA       -ARRYHTHMIAS       -LATE PRESENTING MYOCARDIAL INFARCTION       -COPD   CLINICAL FOLLOW-UP RECOMMENDED.   Glucose, capillary     Status: Abnormal   Collection Time: 10/07/14 10:16 PM  Result Value Ref Range   Glucose-Capillary 137 (H) 65 - 99 mg/dL  Glucose, capillary     Status: Abnormal   Collection Time: 10/08/14 12:39 AM  Result Value Ref Range   Glucose-Capillary 118 (H) 65 - 99 mg/dL  Troponin I     Status: None   Collection Time: 10/08/14 12:45 AM  Result Value Ref Range   Troponin I 0.03 <0.031 ng/mL    Comment:        NO INDICATION OF MYOCARDIAL INJURY.   Glucose, capillary     Status: None   Collection Time: 10/08/14  4:28 AM  Result Value Ref Range   Glucose-Capillary 89 65 - 99 mg/dL  Troponin I     Status: None   Collection Time: 10/08/14  5:44 AM  Result Value Ref Range   Troponin I 0.03 <0.031 ng/mL    Comment:        NO INDICATION OF MYOCARDIAL INJURY.   Glucose, capillary     Status: None   Collection Time: 10/08/14  8:16 AM  Result Value Ref Range   Glucose-Capillary 86 65 - 99 mg/dL  Glucose, capillary     Status: None   Collection Time: 10/08/14 11:53 AM  Result Value Ref Range   Glucose-Capillary 80 65 - 99 mg/dL   Comment 1 Notify RN    Ct Head Wo Contrast  10/07/2014   CLINICAL DATA:  Facial droop. Fall Thursday. Facial  trauma. New RIGHT-sided facial droop. Patient currently undergoing treatment for pancreatic cancer.  EXAM: CT HEAD WITHOUT CONTRAST  CT MAXILLOFACIAL WITHOUT CONTRAST  TECHNIQUE: Multidetector CT imaging of the head and maxillofacial structures were performed using the standard protocol without intravenous contrast. Multiplanar CT image reconstructions of the maxillofacial structures were also generated.  COMPARISON:  None.  FINDINGS: CT HEAD FINDINGS  No mass lesion, mass effect, midline shift, hydrocephalus, hemorrhage. No acute territorial cortical ischemia/infarct. Atrophy and chronic ischemic white matter disease is present. The calvarium is intact. No destructive skull lesions are identified.  CT MAXILLOFACIAL FINDINGS  Globes: Intact  Bony orbits:  Intact.  Pterygoid plates: Intact.  Mandibular condyles:  Located.  Mandible: Intact.  Intracranial contents:  See above.  Paranasal sinuses: Clear.  Mastoid air cells: Normal.  Nasal bones: Intact.  Soft tissues: Normal for noncontrast CT.  Visible cervical spine: Severe cervical spondylosis. Tract calcification along the cruciform ligament of the atlas. Heterogeneous mineralization probably secondary to osteopenia rather than diffuse bony metastatic disease.  IMPRESSION: 1. Atrophy and chronic ischemic white matter disease without acute intracranial abnormality. 2. Negative for facial fracture.   Electronically Signed   By: Dereck Ligas M.D.   On: 10/07/2014 18:18   Harry Jeri Cos WH Contrast  10/08/2014   CLINICAL DATA:  Facial droop beginning 3 days ago. Fall with trauma to the head 3 days ago. Facial droop is right-sided.  EXAM: MRI HEAD WITHOUT AND WITH CONTRAST  TECHNIQUE: Multiplanar, multiecho pulse sequences of  the brain and surrounding structures were obtained without and with intravenous contrast.  CONTRAST:  33m MULTIHANCE GADOBENATE DIMEGLUMINE 529 MG/ML IV SOLN  COMPARISON:  Head CT 10/07/2014  FINDINGS: Diffusion imaging does not show any acute  or subacute infarction. The brainstem and cerebellum are normal. The cerebral hemispheres show mild generalized atrophy with mild chronic small-vessel ischemic changes of the white matter. No cortical or large vessel territory infarction. No mass lesion, hemorrhage, hydrocephalus or extra-axial collection. No abnormal contrast enhancement. No abnormality seen to explain right-sided facial droop.  IMPRESSION: No acute finding. Mild age related atrophy and chronic small vessel disease. No evidence of acute infarction or other cause of right facial droop.   Electronically Signed   By: MNelson ChimesM.D.   On: 10/08/2014 13:37   Ct Maxillofacial Wo Cm  10/07/2014   CLINICAL DATA:  Facial droop. Fall Thursday. Facial trauma. New RIGHT-sided facial droop. Patient currently undergoing treatment for pancreatic cancer.  EXAM: CT HEAD WITHOUT CONTRAST  CT MAXILLOFACIAL WITHOUT CONTRAST  TECHNIQUE: Multidetector CT imaging of the head and maxillofacial structures were performed using the standard protocol without intravenous contrast. Multiplanar CT image reconstructions of the maxillofacial structures were also generated.  COMPARISON:  None.  FINDINGS: CT HEAD FINDINGS  No mass lesion, mass effect, midline shift, hydrocephalus, hemorrhage. No acute territorial cortical ischemia/infarct. Atrophy and chronic ischemic white matter disease is present. The calvarium is intact. No destructive skull lesions are identified.  CT MAXILLOFACIAL FINDINGS  Globes: Intact  Bony orbits:  Intact.  Pterygoid plates: Intact.  Mandibular condyles:  Located.  Mandible: Intact.  Intracranial contents:  See above.  Paranasal sinuses: Clear.  Mastoid air cells: Normal.  Nasal bones: Intact.  Soft tissues: Normal for noncontrast CT.  Visible cervical spine: Severe cervical spondylosis. Tract calcification along the cruciform ligament of the atlas. Heterogeneous mineralization probably secondary to osteopenia rather than diffuse bony metastatic  disease.  IMPRESSION: 1. Atrophy and chronic ischemic white matter disease without acute intracranial abnormality. 2. Negative for facial fracture.   Electronically Signed   By: GDereck LigasM.D.   On: 10/07/2014 18:18    Assessment:  Harry SPEASEis a 69y.o. male with metastatic pancreatic cancer. He is currently day 4 of cycle #6 gemcitabine and Abraxane and responding to therapy.  Imaging studies and CA19-9 have decreased.  His performance status has improved.  He is no longer on pain medications.  He has a 4 day history of facial droop following a fall.  He has no fracture.  Exam is consistent with a central process rather than Bell's palsy.  Head CT without contrast revealed only atrophy and chronic ischemic white matter changes.  Plan: 1. Agree with head MRI with and without contrast, echo, and carotid dopplers. 2. Swallowing study. 3. Anticipate follow-up with outpatient clinic on 08/25 for continuation of chemotherapy.  Call if any questions.  Thank you for allowing me to participate in Harry Roman's care.   MLequita Asal MD  10/08/2014, 1:54 PM

## 2014-10-08 NOTE — Plan of Care (Signed)
Problem: Discharge/Transitional Outcomes Goal: Other Discharge Outcomes/Goals Outcome: Progressing 1. Speech therapists not available today to proceed with swallowing evaluation. Pt had to be kept NPO awaiting ST consult. MD aware with meds adjusted per her directive. 2. Continued education- will complete when final diagnosis made and at discharge. 3. VSS. Labs stable. Alert, pleasant, appropriate. 4. Turns/moves self, requires minimal assist. 5.NPO until speech evaluation done. IVF's continued with new MD order to maintain. 6. INR 1.83 with no sign/symptom bleeding. 7. Wife plans to care for pt at home upon discharge. 8. Will give FU appointment information/assist upon discharge. 9. No identified problem in obtaining home meds. 10. Stroke workup negative thus far- will await MD determination of right facial weakness.

## 2014-10-08 NOTE — Progress Notes (Signed)
   10/08/14 1000  Clinical Encounter Type  Visited With Patient;Family  Visit Type Initial;Follow-up  Referral From Nurse  Spiritual Encounters  [place] visited room of patient. Patient was engaged with Doctor. Chaplain did not visit patient based on patient and Doctor talking. Chaplain engaged nurse about AD Education and documents. Chaplain Cassidie Veiga will follow up.

## 2014-10-08 NOTE — Plan of Care (Signed)
Problem: Discharge/Transitional Outcomes Goal: Barriers To Progression Addressed/Resolved Outcome: Progressing Patient is a high fall risk, Full Code Lives at home with spouse Hx of metastatic pancreatic cancer, DM II, hyperlipiemia, hypothyroidism, continue with home medications Last chemotherapy treatment on August 18th  [facility] accessed Goal: Other Discharge Outcomes/Goals Outcome: Progressing Patient is alert, disoriented to time. Has right facial droop. No c/o pain, NIH scale of 6. Patient unable to lift left arm d/t weakness from chemotherapy. Remains NPO d/t unsuccessful swallow screen, NS at 75 ml/hr. Wife asleep at bedside.

## 2014-10-08 NOTE — Progress Notes (Signed)
Pt off floor for FSBS check at 12 Noon:  neuro checks at 1200 and 1400 with pt off unit for tests.

## 2014-10-09 DIAGNOSIS — G510 Bell's palsy: Secondary | ICD-10-CM | POA: Insufficient documentation

## 2014-10-09 LAB — GLUCOSE, CAPILLARY
GLUCOSE-CAPILLARY: 138 mg/dL — AB (ref 65–99)
GLUCOSE-CAPILLARY: 62 mg/dL — AB (ref 65–99)
GLUCOSE-CAPILLARY: 67 mg/dL (ref 65–99)
GLUCOSE-CAPILLARY: 68 mg/dL (ref 65–99)
GLUCOSE-CAPILLARY: 76 mg/dL (ref 65–99)
GLUCOSE-CAPILLARY: 97 mg/dL (ref 65–99)
Glucose-Capillary: 62 mg/dL — ABNORMAL LOW (ref 65–99)
Glucose-Capillary: 103 mg/dL — ABNORMAL HIGH (ref 65–99)
Glucose-Capillary: 206 mg/dL — ABNORMAL HIGH (ref 65–99)

## 2014-10-09 MED ORDER — DEXTROSE 50 % IV SOLN
INTRAVENOUS | Status: AC
  Administered 2014-10-09: 25 mL via INTRAVENOUS
  Filled 2014-10-09: qty 50

## 2014-10-09 MED ORDER — ENALAPRIL MALEATE 10 MG PO TABS
30.0000 mg | ORAL_TABLET | Freq: Every day | ORAL | Status: DC
  Administered 2014-10-09: 30 mg via ORAL
  Filled 2014-10-09: qty 1.5

## 2014-10-09 MED ORDER — DEXTROSE 50 % IV SOLN
25.0000 mL | Freq: Once | INTRAVENOUS | Status: AC
  Administered 2014-10-09 (×2): 25 mL via INTRAVENOUS

## 2014-10-09 MED ORDER — PREDNISONE 5 MG PO TABS: 5.0000 mg | ORAL_TABLET | Freq: Every day | ORAL | Status: DC

## 2014-10-09 MED ORDER — DEXTROSE 50 % IV SOLN
INTRAVENOUS | Status: AC
  Administered 2014-10-09: 07:00:00 25 mL via INTRAVENOUS
  Filled 2014-10-09: qty 50

## 2014-10-09 MED ORDER — CARVEDILOL 6.25 MG PO TABS
12.5000 mg | ORAL_TABLET | Freq: Two times a day (BID) | ORAL | Status: DC
  Administered 2014-10-09: 11:00:00 12.5 mg via ORAL
  Filled 2014-10-09: qty 2

## 2014-10-09 MED ORDER — ENALAPRIL MALEATE 20 MG PO TABS: 20.0000 mg | ORAL_TABLET | Freq: Every day | ORAL | Status: DC

## 2014-10-09 NOTE — Evaluation (Signed)
Clinical/Bedside Swallow Evaluation Patient Details  Name: Harry Roman MRN: [id number] Date of Birth: 09-22-1945  Today's Date: 10/09/2014 Time: SLP Start Time (ACUTE ONLY): 0830 SLP Stop Time (ACUTE ONLY): 0930 SLP Time Calculation (min) (ACUTE ONLY): 60 min  Past Medical History:  Past Medical History  Diagnosis Date  . Hypertension   . Diabetes mellitus without complication   . Hyperlipidemia   . Cancer 2016    pancreas  . Ascites 2016  . 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 Neuropathy 09/30/2014  . Hypothyroidism    Past Surgical History:  Past Surgical History  Procedure Laterality Date  . Hemorrhoid surgery    . Colonoscopy  Jan 2015    Dr Donnella Sham  . Peritoneal fluid aspiration  2016   HPI:  Pt is an 69 y.o. male with h/o HTn and DM as well as metastatic pancreatic cancer who was admitted through the emergency room with right facial droop and inability to close his R eye that has been going on for the past 3 days.MRI revealed no acute abnormality seen. Pt is NPO at this time until this eval.   Assessment / Plan / Recommendation Clinical Impression  Pt presented w/ min. decreased R labial tone and strength resulting in slight-min. leakage of thin liquids and purees in the R corner of mouth. Pt was able to clear appropriately w/ all trials; no lingual weakness. No overt s/s of aspiration noted w/ po trials. Education given to pt on monitoring oral clearing(using lingual sweeps) as well as taking small, single bites to avoid increased labial leakage on R side to the weakness from Bell's Palsy(dx of MD). Brief education given on this dx as well; MD to f/u further w/ information re: any tx. Rec.'d pt keep a napkin present to wipe at his mouth in the R corner during meals. Rec. a mech soft diet at this time for soft, cooked foods; general aspiration precautions. Pt and family agreed. No further f/u at this time. ST will be available while admitted. MD/NSG updated  and agreed.     Aspiration Risk   (reduced)    Diet Recommendation Dysphagia 3 (Mech soft);Thin   Medication Administration: Whole meds with puree (pt/family felt better w/ this) Compensations: Slow rate;Small sips/bites;Check for anterior loss;Check for pocketing;Follow solids with liquid    Other  Recommendations Recommended Consults:  (Dietician consult) Oral Care Recommendations: Oral care BID;Patient independent with oral care   Follow Up Recommendations       Frequency and Duration        Pertinent Vitals/Pain denied    SLP Swallow Goals  n/a    Swallow Study Prior Functional Status  Type of Home: House Available Help at Discharge: Family (Wife)  Eats and drinks a regular diet at home.     General Date of Onset: 10/07/14 Other Pertinent Information: Pt is an 69 y.o. male with h/o HTn and DM as well as metastatic pancreatic cancer who was admitted through the emergency room with right facial droop and inability to close his R eye that has been going on for the past 3 days.MRI revealed no acute abnormality seen. Pt is NPO at this time until this eval. Type of Study: Bedside swallow evaluation Previous Swallow Assessment: none Diet Prior to this Study: Regular;Thin liquids Temperature Spikes Noted: No Respiratory Status: Room air (wbx 10.7 on 8.20.16) History of Recent Intubation: No Behavior/Cognition: Alert;Cooperative;Pleasant mood Oral Cavity - Dentition: Adequate natural dentition/normal for age;Missing dentition Self-Feeding Abilities: Able  to feed self Patient Positioning: Upright in bed Baseline Vocal Quality: Normal Volitional Cough: Strong Volitional Swallow: Able to elicit    Oral/Motor/Sensory Function Overall Oral Motor/Sensory Function: Impaired Labial ROM: Reduced right Labial Symmetry: Within Functional Limits Labial Strength: Reduced (on Right) Lingual ROM: Within Functional Limits Lingual Symmetry: Within Functional Limits Lingual Strength:  Within Functional Limits Facial ROM: Reduced right Facial Symmetry: Right droop Velum: Within Functional Limits Mandible: Within Functional Limits   Ice Chips Ice chips: Within functional limits Presentation: Spoon (fed; x3 trials)   Thin Liquid Thin Liquid: Impaired Presentation: Cup;Self Fed;Straw (~3 ozs) Oral Phase Impairments: Reduced labial seal (on Right) Oral Phase Functional Implications:  (R labial residue w/ leakage) Pharyngeal  Phase Impairments:  (none)    Nectar Thick Nectar Thick Liquid: Not tested   Honey Thick Honey Thick Liquid: Not tested   Puree Puree: Impaired Presentation: Self Fed;Spoon (x8 trials) Oral Phase Impairments: Reduced labial seal Oral Phase Functional Implications:  (slight lablial residue in R corner) Pharyngeal Phase Impairments:  (none)   Solid   GO    Solid: Impaired Presentation: Self Fed;Spoon (x3 trials) Oral Phase Impairments: Reduced labial seal (on R) Oral Phase Functional Implications:  (none) Pharyngeal Phase Impairments:  (none)      Orinda Kenner, MS, CCC-SLP  Julias Mould 10/09/2014,2:16 PM

## 2014-10-09 NOTE — Evaluation (Signed)
Physical Therapy Evaluation Patient Details Name: Harry Roman MRN: [id number] DOB: [birth year] Today's Date: 10/09/2014   History of Present Illness  Pt was admitted to hospital for R side facial droop after a falling and landing on his face. CT and MRI scans were negative and the neurologists diagnosed the pt with Bell's palsy. Pt is currently undergoing chemotherapy for pancreatic cancer.      Clinical Impression  Initially, pt was not compliant with PT, but with further encouragement from wife/PT, pt agreed to complete the session.  Pt was mod I with bed mobility and was able to complete unsupported seated therex at the EOB with no difficulty, pt would intermittently require tactile facilitation to prevent posterior trunk lean. Pt requires mod assist with toilet transfers/ambulation and displays impulsivity/inconsistent step pattern, pt's wife reports that this was the pt's prior level of function and uses a WC to ambulate at home.  Pt would continue to benefit from skilled PT in order to increase gross strength, improve with transfers, and normalize gait pattern.        Follow Up Recommendations Home health PT;Supervision/Assistance - 24 hour    Equipment Recommendations  Wheelchair (measurements PT)    Recommendations for Other Services       Precautions / Restrictions Precautions Precautions: Fall Restrictions Weight Bearing Restrictions: No      Mobility  Bed Mobility Overal bed mobility: Needs Assistance Bed Mobility: Supine to Sit     Supine to sit: Modified independent (Device/Increase time)     General bed mobility comments: It took him longer than he would normally require, but demonstratesd proper trunk control and is able to sit up.  Transfers Overall transfer level: Needs assistance Equipment used: Rolling walker (2 wheeled) Transfers: Sit to/from Stand Sit to Stand: Min assist         General transfer comment: Pt is able to stand up with min assist  using a RW, requires verbal cuing to maintain knee flexion/increase BOS before standing in order to increase his balance.  Ambulation/Gait Ambulation/Gait assistance: Mod assist Ambulation Distance (Feet): 120 Feet Assistive device: Rolling walker (2 wheeled)       General Gait Details: Pt requires mod assist using a RW and demonstrates decreased R stance time/decreased step width/decreased bilat hip and knee flexion (causes hip circumduction copensation)/inconsistent step pattern/ankle inversion.   Stairs            Wheelchair Mobility    Modified Rankin (Stroke Patients Only)       Balance Overall balance assessment: Needs assistance Sitting-balance support: Feet supported;Single extremity supported Sitting balance-Leahy Scale: Fair Sitting balance - Comments: Pt was able to complete hip marches/LAQs but requires tactile facilitation to perform them correctly, otherwise his trunk leans posteriorly and he loses balance.    Standing balance support: Bilateral upper extremity supported Standing balance-Leahy Scale: Poor Standing balance comment: Pt was unable to walk without a RW and requires mod assist secondary to his bilat trunk sway. Pt was unable to clean himself during toilet transfer in standing secondary to FOF.                              Pertinent Vitals/Pain Pain Assessment: No/denies pain    Home Living Family/patient expects to be discharged to:: Private residence Living Arrangements: Spouse/significant other Available Help at Discharge: Family (Wife) Type of Home: House Home Access: Ramped entrance     Home Layout: One level Home Equipment: Environmental consultant -  2 wheels;Bedside commode;Wheelchair - Education administrator (comment) Agricultural consultant)      Prior Function Level of Independence: Needs assistance   Gait / Transfers Assistance Needed: Uses RW for toilet/bed transfers, uses WC for houshold mobilization, and uses rollator for longer community distances.            Hand Dominance   Dominant Hand: Left    Extremity/Trunk Assessment   Upper Extremity Assessment: Overall WFL for tasks assessed           Lower Extremity Assessment: Generalized weakness (Pt has 3+/5 bilat LE strength with exception to 4/5 DF. Pt reports this was his strength prior to hospital admission. )         Communication   Communication: No difficulties  Cognition Arousal/Alertness: Awake/alert Behavior During Therapy: WFL for tasks assessed/performed Overall Cognitive Status: Within Functional Limits for tasks assessed                      General Comments      Exercises Other Exercises Other Exercises: Seated at EOB bilat hip marches/LAQs/shoulder flex-horizontal add-ext sequence with isometric add using a pillow/shoulder flexion with isometric hip abd, 1 x 10. Mod assist using a RW toilet transfer requiring bilat UE compensation with ascending/descending, pt was unable to clean himself secondary to FOF with letting go of the RW. (9min)      Assessment/Plan    PT Assessment Patient needs continued PT services  PT Diagnosis Difficulty walking;Abnormality of gait;Generalized weakness   PT Problem List Decreased strength;Decreased activity tolerance;Decreased balance;Decreased mobility;Decreased range of motion;Decreased cognition;Decreased coordination;Decreased safety awareness;Decreased knowledge of use of DME  PT Treatment Interventions     PT Goals (Current goals can be found in the Care Plan section) Acute Rehab PT Goals Patient Stated Goal: to return home PT Goal Formulation: With patient/family Time For Goal Achievement: 10/23/14 Potential to Achieve Goals: Good    Frequency Min 2X/week   Barriers to discharge Inaccessible home environment      Co-evaluation               End of Session Equipment Utilized During Treatment: Gait belt Activity Tolerance: Patient tolerated treatment well;Patient limited by  fatigue Patient left: in bed;with call bell/phone within reach;with bed alarm set;with family/visitor present Nurse Communication: Mobility status         Time: 3832-9191 PT Time Calculation (min) (ACUTE ONLY): 31 min   Charges:         PT G CodesBernestine Amass, SPT 10/09/2014 2:23 PM

## 2014-10-09 NOTE — Progress Notes (Signed)
Pts FSBS down to 62 this morning. Hypoglycemia protocol initiated, 57ml D50 IV given. Recheck FSBS up to 103. Day RN aware.

## 2014-10-09 NOTE — Progress Notes (Signed)
FSBS up to 97, continue to monitor FSBS.

## 2014-10-09 NOTE — Progress Notes (Signed)
Patients FSBS down to 67 and recheck 62. Hypoglycemia protocol orders entered and 55ml D50 given per protocol. Will recheck FSBS in 1minute per protocol.

## 2014-10-09 NOTE — Progress Notes (Signed)
   10/09/14 0930  Clinical Encounter Type  Visited With Patient and family together  Visit Type Follow-up  Referral From Nurse  Consult/Referral To Chaplain  Spiritual Encounters  Spiritual Needs Literature  Advance Directives (For Healthcare)  Does patient have an advance directive? Yes  Copy of advanced directive(s) in chart? Yes  Visited with patient and family member.  Provided pastoral presence and support.  Facilitated completion of Advance Directive.  Copy in patient's record.  [place] [phone]

## 2014-10-09 NOTE — Care Management Note (Signed)
Case Management Note  Patient Details  Name: OMA ALPERT MRN: [id number] Date of Birth: Mar 23, 1945  Subjective/Objective:    Family chose [facility] to be Mr Mines's home health provider. Stated that they had used [facility] in the past. A referral for home health PT was called to [place] . Mr Smigiel is being discharged home today.                Action/Plan:   Expected Discharge Date:                  Expected Discharge Plan:     In-House Referral:     Discharge planning Services     Post Acute Care Choice:    Choice offered to:     DME Arranged:    DME Agency:     HH Arranged:    [place] Agency:     Status of Service:     Medicare Important Message Given:  Yes-second notification given Date Medicare IM Given:    Medicare IM give by:    Date Additional Medicare IM Given:    Additional Medicare Important Message give by:     If discussed at [place] of Stay Meetings, dates discussed:    Additional Comments:  Lars Jeziorski A, RN 10/09/2014, 4:06 PM

## 2014-10-09 NOTE — Plan of Care (Signed)
Problem: Discharge/Transitional Outcomes Goal: Other Discharge Outcomes/Goals Outcome: Progressing Plan of care progress to goal:  Pt had irregular rhythms most of day shown on tele - had run of V tach and multiple PVCs.  Pt was asymptomatic.  Pt had swallow eval - recommended Dysphagia 3 Mechanical soft - Meds in applesauce.  Also worked w/PT - Home PT was recommended.  Port was [employer].  D/c instructions reviewed. No evidence of acute infarct.  Facial droop may be bell's palsy.  Left arm droop b/c pt had riding lawn mower fall on him.  After receiving glucose this am for hypoglycemia protocol, FBS improved.  F/u appt made.  Pt will be going home w/wife.

## 2014-10-09 NOTE — Care Management Important Message (Signed)
Important Message  Patient Details  Name: ELJAY LAVE MRN: [id number] Date of Birth: 10/14/1945   Medicare Important Message Given:  Yes-second notification given    Darius Bump Allmond 10/09/2014, 1:42 PM

## 2014-10-09 NOTE — Discharge Summary (Addendum)
[facility] [place] NAME: Harry Roman    MR#:  [id number]  DATE OF BIRTH:  1945/05/12  DATE OF ADMISSION:  10/07/2014 ADMITTING PHYSICIAN: Juluis Mire, MD  DATE OF DISCHARGE: 10/09/14  PRIMARY CARE PHYSICIAN: Marguerita Merles, MD    ADMISSION DIAGNOSIS:  Facial droop due to stroke [I69.392] Ischemic stroke [I63.50]  DISCHARGE DIAGNOSIS:  Right Side Bell's palsy  SECONDARY DIAGNOSIS:   Past Medical History  Diagnosis Date  . Hypertension   . Diabetes mellitus without complication   . Hyperlipidemia   . Cancer 2016    pancreas  . Ascites 2016  . 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 Neuropathy 09/30/2014  . Hypothyroidism     HOSPITAL COURSE:   1. Right-sided facial droop concerning for CVA. CT head negative for acute stroke/metastases. - Neuro input from Dr Irish Elders appreciated - neuro watch, telemetry monitoring, continue aspirin -seen by speech and dietary recs given MRI brain neg for CVA -started o po prednisone taper for right sidec facial palsy  2. Metastatic pancreatic cancer undergoing chemotherapy per oncology. No acute problems. Oncology f/u as out pt  3. Hypertension, on multiple medications.  -resumed BP meds at d/c  4. Diabetes mellitus type 2, stable on home medications. resumed home medications    5. Hyperlipidemia, stable on statin.   6. Mildly elevated troponin. No cardiac symptoms, no history of cardiac problems the past  7. Hypothyroidism stable on Levoxyl  Seen by PT and recommended HHPT. CM for d/c planing D/c planig d/w pt's wife  DISCHARGE CONDITIONS:   fair  CONSULTS OBTAINED:  Treatment Team:  Leotis Pain, MD Lequita Asal, MD  DRUG ALLERGIES:  No Known Allergies  DISCHARGE MEDICATIONS:   Current Discharge Medication List    START taking these medications   Details  predniSONE (DELTASONE) 5 MG tablet Take 1 tablet (5 mg total) by mouth daily  with breakfast. Prednisone taper label and dispense accordingly Day 1: 30 mg divided as 10 mg before BF, 5 mg at lunch, 5 mg at dinner, 10 mg at qhs  Day 2: 5 mg at BF, 5 mg at lunch, 5 mg at dinner, 10 mg at qhs Day 3: 5 mg 4 times daily (with meals and at bedtime), Day 4: 5 mg 3 times daily (BF, lunch, bedtime) Day 5: 5 mg 2 times daily (Bf, bedtime) Day 6: 5 mg before BF Qty: 21 tablet, Refills: 0      CONTINUE these medications which have NOT CHANGED   Details  aspirin 81 MG tablet Take 81 mg by mouth daily.    carvedilol (COREG) 12.5 MG tablet Take 12.5 mg by mouth 2 (two) times daily with a meal.    enalapril (VASOTEC) 20 MG tablet Take 20 mg by mouth daily.    insulin regular (NOVOLIN R,HUMULIN R) 100 units/mL injection Sliding Scale  Glucose 200-300 take 2 units Glucose 301-400 take 4 units  Glucose 401-500 take 6 units Qty: 10 mL, Refills: 1    KLOR-CON M20 20 MEQ tablet Take 1 tablet by mouth 2 (two) times daily.    levothyroxine (SYNTHROID, LEVOTHROID) 50 MCG tablet Take 50 mcg by mouth daily before breakfast.    lovastatin (MEVACOR) 10 MG tablet Take 10 mg by mouth at bedtime.        If you experience worsening of your admission symptoms, develop shortness of breath, life threatening emergency, suicidal or homicidal thoughts you must seek medical  attention immediately by calling 911 or calling your MD immediately  if symptoms less severe.  You Must read complete instructions/literature along with all the possible adverse reactions/side effects for all the Medicines you take and that have been prescribed to you. Take any new Medicines after you have completely understood and accept all the possible adverse reactions/side effects.   Please note  You were cared for by a hospitalist during your hospital stay. If you have any questions about your discharge medications or the care you received while you were in the hospital after you are discharged, you can call the unit  and asked to speak with the hospitalist on call if the hospitalist that took care of you is not available. Once you are discharged, your primary care physician will handle any further medical issues. Please note that NO REFILLS for any discharge medications will be authorized once you are discharged, as it is imperative that you return to your primary care physician (or establish a relationship with a primary care physician if you do not have one) for your aftercare needs so that they can reassess your need for medications and monitor your lab values. Today   SUBJECTIVE   Feels better today  VITAL SIGNS:  Blood pressure 157/73, pulse 73, temperature 97.8 F (36.6 C), temperature source Oral, resp. rate 18, height 5\' 7"  (1.702 m), weight 59.557 kg (131 lb 4.8 oz), SpO2 100 %.  I/O:   Intake/Output Summary (Last 24 hours) at 10/09/14 1008 Last data filed at 10/09/14 0634  Gross per 24 hour  Intake    819 ml  Output   1150 ml  Net   -331 ml    PHYSICAL EXAMINATION:  GENERAL:  69 y.o.-year-old patient lying in the bed with no acute distress.  EYES: Pupils equal, round, reactive to light and accommodation. No scleral icterus. Extraocular muscles intact.  HEENT: Head atraumatic, normocephalic. Oropharynx and nasopharynx clear.  NECK:  Supple, no jugular venous distention. No thyroid enlargement, no tenderness.  LUNGS: Normal breath sounds bilaterally, no wheezing, rales,rhonchi or crepitation. No use of accessory muscles of respiration.  CARDIOVASCULAR: S1, S2 normal. No murmurs, rubs, or gallops.  ABDOMEN: Soft, non-tender, non-distended. Bowel sounds present. No organomegaly or mass.  EXTREMITIES: No pedal edema, cyanosis, or clubbing.  NEUROLOGIC: right side bell's pasy. Muscle strength 5/5 in all extremities. Sensation intact. Gait not checked.  PSYCHIATRIC: The patient is alert and oriented x 3.  SKIN: No obvious rash, lesion, or ulcer.   DATA REVIEW:   CBC   Recent Labs Lab  10/07/14 2006  WBC 10.7*  HGB 7.6*  HCT 24.2*  PLT 123*    Chemistries   Recent Labs Lab 10/05/14 1031 10/07/14 2006  NA 133* 136  K 3.9 3.4*  CL 105 106  CO2 24 26  GLUCOSE 261* 157*  BUN 15 12  CREATININE 0.98 0.73  CALCIUM 7.5* 7.7*  MG 1.7  --   AST 24 22  ALT 13* 12*  ALKPHOS 62 47  BILITOT 0.3 0.3    Microbiology Results   No results found for this or any previous visit (from the past 240 hour(s)).  RADIOLOGY:  Ct Head Wo Contrast  10/07/2014   CLINICAL DATA:  Facial droop. Fall Thursday. Facial trauma. New RIGHT-sided facial droop. Patient currently undergoing treatment for pancreatic cancer.  EXAM: CT HEAD WITHOUT CONTRAST  CT MAXILLOFACIAL WITHOUT CONTRAST  TECHNIQUE: Multidetector CT imaging of the head and maxillofacial structures were performed using the standard protocol  without intravenous contrast. Multiplanar CT image reconstructions of the maxillofacial structures were also generated.  COMPARISON:  None.  FINDINGS: CT HEAD FINDINGS  No mass lesion, mass effect, midline shift, hydrocephalus, hemorrhage. No acute territorial cortical ischemia/infarct. Atrophy and chronic ischemic white matter disease is present. The calvarium is intact. No destructive skull lesions are identified.  CT MAXILLOFACIAL FINDINGS  Globes: Intact  Bony orbits:  Intact.  Pterygoid plates: Intact.  Mandibular condyles:  Located.  Mandible: Intact.  Intracranial contents:  See above.  Paranasal sinuses: Clear.  Mastoid air cells: Normal.  Nasal bones: Intact.  Soft tissues: Normal for noncontrast CT.  Visible cervical spine: Severe cervical spondylosis. Tract calcification along the cruciform ligament of the atlas. Heterogeneous mineralization probably secondary to osteopenia rather than diffuse bony metastatic disease.  IMPRESSION: 1. Atrophy and chronic ischemic white matter disease without acute intracranial abnormality. 2. Negative for facial fracture.   Electronically Signed   By:  Dereck Ligas M.D.   On: 10/07/2014 18:18   Mr Jeri Cos [facility] Contrast  10/08/2014   CLINICAL DATA:  Facial droop beginning 3 days ago. Fall with trauma to the head 3 days ago. Facial droop is right-sided.  EXAM: MRI HEAD WITHOUT AND WITH CONTRAST  TECHNIQUE: Multiplanar, multiecho pulse sequences of the brain and surrounding structures were obtained without and with intravenous contrast.  CONTRAST:  7mL MULTIHANCE GADOBENATE DIMEGLUMINE 529 MG/ML IV SOLN  COMPARISON:  Head CT 10/07/2014  FINDINGS: Diffusion imaging does not show any acute or subacute infarction. The brainstem and cerebellum are normal. The cerebral hemispheres show mild generalized atrophy with mild chronic small-vessel ischemic changes of the white matter. No cortical or large vessel territory infarction. No mass lesion, hemorrhage, hydrocephalus or extra-axial collection. No abnormal contrast enhancement. No abnormality seen to explain right-sided facial droop.  IMPRESSION: No acute finding. Mild age related atrophy and chronic small vessel disease. No evidence of acute infarction or other cause of right facial droop.   Electronically Signed   By: Nelson Chimes M.D.   On: 10/08/2014 13:37   US Carotid Bilateral  10/08/2014   CLINICAL DATA:  Recent CVA  EXAM: BILATERAL CAROTID DUPLEX ULTRASOUND  TECHNIQUE: Pearline Cables scale imaging, color Doppler and duplex ultrasound were performed of bilateral carotid and vertebral arteries in the neck.  COMPARISON:  None.  FINDINGS: Criteria: Quantification of carotid stenosis is based on velocity parameters that correlate the residual internal carotid diameter with NASCET-based stenosis levels, using the diameter of the distal internal carotid lumen as the denominator for stenosis measurement.  The following velocity measurements were obtained:  RIGHT  ICA:  96/26 cm/sec  CCA:  79/89 cm/sec  SYSTOLIC ICA/CCA RATIO:  1.7  DIASTOLIC ICA/CCA RATIO:  2.6  ECA:  63 cm/sec  LEFT  ICA:  193/62 cm/sec  CCA:  21/19 cm/sec   SYSTOLIC ICA/CCA RATIO:  3.6  DIASTOLIC ICA/CCA RATIO:  4.5  ECA:  93 cm/sec  RIGHT CAROTID ARTERY: Mild plaque formation is noted in the common and internal carotid arteries. The waveforms, velocities and flow velocity ratios show no evidence focal hemodynamically significant stenosis. Antegrade in nature.  RIGHT VERTEBRAL ARTERY:  Antegrade in nature.  LEFT CAROTID ARTERY: Grayscale images demonstrate mild atherosclerotic plaque in some tortuosity of the internal carotid artery. The waveforms, velocities and flow velocity ratios suggest a stenosis in the upper end of the 50-69% range.  LEFT VERTEBRAL ARTERY:  Antegrade in nature.  IMPRESSION: Mild plaque bilaterally. Some tortuosity of the left internal carotid artery is  noted. This contributes to a stenosis in the 50-69% range in the left internal carotid artery.  No significant stenosis is noted on the right.   Electronically Signed   By: Inez Catalina M.D.   On: 10/08/2014 15:25   Ct Maxillofacial Wo Cm  10/07/2014   CLINICAL DATA:  Facial droop. Fall Thursday. Facial trauma. New RIGHT-sided facial droop. Patient currently undergoing treatment for pancreatic cancer.  EXAM: CT HEAD WITHOUT CONTRAST  CT MAXILLOFACIAL WITHOUT CONTRAST  TECHNIQUE: Multidetector CT imaging of the head and maxillofacial structures were performed using the standard protocol without intravenous contrast. Multiplanar CT image reconstructions of the maxillofacial structures were also generated.  COMPARISON:  None.  FINDINGS: CT HEAD FINDINGS  No mass lesion, mass effect, midline shift, hydrocephalus, hemorrhage. No acute territorial cortical ischemia/infarct. Atrophy and chronic ischemic white matter disease is present. The calvarium is intact. No destructive skull lesions are identified.  CT MAXILLOFACIAL FINDINGS  Globes: Intact  Bony orbits:  Intact.  Pterygoid plates: Intact.  Mandibular condyles:  Located.  Mandible: Intact.  Intracranial contents:  See above.  Paranasal sinuses:  Clear.  Mastoid air cells: Normal.  Nasal bones: Intact.  Soft tissues: Normal for noncontrast CT.  Visible cervical spine: Severe cervical spondylosis. Tract calcification along the cruciform ligament of the atlas. Heterogeneous mineralization probably secondary to osteopenia rather than diffuse bony metastatic disease.  IMPRESSION: 1. Atrophy and chronic ischemic white matter disease without acute intracranial abnormality. 2. Negative for facial fracture.   Electronically Signed   By: Dereck Ligas M.D.   On: 10/07/2014 18:18     Management plans discussed with the patient, family and they are in agreement.  CODE STATUS:     Code Status Orders        Start     Ordered   10/08/14 0031  Full code   Continuous     10/08/14 0031      TOTAL TIME TAKING CARE OF THIS PATIENT: 40 minutes.    Lavera Vandermeer M.D on 10/09/2014 at 10:08 AM  Between 7am to 6pm - Pager - [phone] After 6pm go to [email or web address] - password [email or web address] [facility]  Office  [phone]  CC: Primary care physician; Marguerita Merles, MD

## 2014-10-09 NOTE — Plan of Care (Signed)
Problem: Discharge/Transitional Outcomes Goal: Other Discharge Outcomes/Goals Outcome: Progressing Plan of care progress to goals: Barriers to progress-Pt continues to be NPO awaiting speech evaluation. FSBS continues to be 60-70's. Continued education- will complete when final diagnosis made and at discharge. Hemodynamically stable- VSS. Labs stable. Alert, pleasant, appropriate. Independent mobility-Turns/moves self, requires minimal assist. Tolerating diet- pt continues to be NPO. FSBS down to 62, hypoglycemia protocol initiated and D50 15ml IV given per protocol and FSBS recheck was 97.  INR 1.83 with no sign/symptom bleeding. Family and pt agreement- family supportive at bedside. Stroke workup negative thus far- will await MD determination of right facial weakness.

## 2014-10-12 ENCOUNTER — Telehealth: Payer: Self-pay | Admitting: *Deleted

## 2014-10-12 ENCOUNTER — Inpatient Hospital Stay: Payer: Medicare Other

## 2014-10-12 ENCOUNTER — Inpatient Hospital Stay: Payer: Medicare Other | Admitting: Hematology and Oncology

## 2014-10-12 VITALS — BP 160/82 | HR 62 | Temp 96.1°F | Resp 18

## 2014-10-12 DIAGNOSIS — Z5111 Encounter for antineoplastic chemotherapy: Secondary | ICD-10-CM | POA: Diagnosis not present

## 2014-10-12 DIAGNOSIS — G629 Polyneuropathy, unspecified: Secondary | ICD-10-CM | POA: Diagnosis not present

## 2014-10-12 DIAGNOSIS — E119 Type 2 diabetes mellitus without complications: Secondary | ICD-10-CM | POA: Diagnosis not present

## 2014-10-12 DIAGNOSIS — Z79899 Other long term (current) drug therapy: Secondary | ICD-10-CM | POA: Diagnosis not present

## 2014-10-12 DIAGNOSIS — R531 Weakness: Secondary | ICD-10-CM | POA: Diagnosis not present

## 2014-10-12 DIAGNOSIS — C787 Secondary malignant neoplasm of liver and intrahepatic bile duct: Secondary | ICD-10-CM | POA: Diagnosis not present

## 2014-10-12 DIAGNOSIS — I10 Essential (primary) hypertension: Secondary | ICD-10-CM | POA: Diagnosis not present

## 2014-10-12 DIAGNOSIS — C252 Malignant neoplasm of tail of pancreas: Secondary | ICD-10-CM

## 2014-10-12 DIAGNOSIS — R634 Abnormal weight loss: Secondary | ICD-10-CM | POA: Diagnosis not present

## 2014-10-12 DIAGNOSIS — R609 Edema, unspecified: Secondary | ICD-10-CM | POA: Diagnosis not present

## 2014-10-12 DIAGNOSIS — D509 Iron deficiency anemia, unspecified: Secondary | ICD-10-CM | POA: Diagnosis not present

## 2014-10-12 DIAGNOSIS — C801 Malignant (primary) neoplasm, unspecified: Secondary | ICD-10-CM

## 2014-10-12 DIAGNOSIS — R5383 Other fatigue: Secondary | ICD-10-CM | POA: Diagnosis not present

## 2014-10-12 DIAGNOSIS — E538 Deficiency of other specified B group vitamins: Secondary | ICD-10-CM | POA: Diagnosis not present

## 2014-10-12 DIAGNOSIS — Z7982 Long term (current) use of aspirin: Secondary | ICD-10-CM | POA: Diagnosis not present

## 2014-10-12 DIAGNOSIS — E785 Hyperlipidemia, unspecified: Secondary | ICD-10-CM | POA: Diagnosis not present

## 2014-10-12 LAB — COMPREHENSIVE METABOLIC PANEL
ALT: 15 U/L — ABNORMAL LOW (ref 17–63)
AST: 26 U/L (ref 15–41)
Albumin: 2 g/dL — ABNORMAL LOW (ref 3.5–5.0)
Alkaline Phosphatase: 56 U/L (ref 38–126)
Anion gap: 3 — ABNORMAL LOW (ref 5–15)
BUN: 7 mg/dL (ref 6–20)
CO2: 25 mmol/L (ref 22–32)
Calcium: 7.7 mg/dL — ABNORMAL LOW (ref 8.9–10.3)
Chloride: 104 mmol/L (ref 101–111)
Creatinine, Ser: 0.69 mg/dL (ref 0.61–1.24)
GFR calc Af Amer: >60 mL/min (ref >60)
GFR calc non Af Amer: >60 mL/min (ref >60)
Glucose, Bld: 265 mg/dL — ABNORMAL HIGH (ref 65–99)
Potassium: 3.5 mmol/L (ref 3.5–5.1)
Sodium: 132 mmol/L — ABNORMAL LOW (ref 135–145)
Total Bilirubin: 0.3 mg/dL (ref 0.3–1.2)
Total Protein: 6.3 g/dL — ABNORMAL LOW (ref 6.5–8.1)

## 2014-10-12 LAB — CBC WITH DIFFERENTIAL/PLATELET
BASOS ABS: 0 10*3/uL (ref 0–0.1)
Basophils Relative: 0 %
EOS ABS: 0 10*3/uL (ref 0–0.7)
EOS PCT: 0 %
HCT: 24.4 % — ABNORMAL LOW (ref 40.0–52.0)
HEMOGLOBIN: 8 g/dL — AB (ref 13.0–18.0)
LYMPHS ABS: 0.9 10*3/uL — AB (ref 1.0–3.6)
LYMPHS PCT: 22 %
MCH: 27.1 pg (ref 26.0–34.0)
MCHC: 32.6 g/dL (ref 32.0–36.0)
MCV: 83 fL (ref 80.0–100.0)
Monocytes Absolute: 0.3 10*3/uL (ref 0.2–1.0)
Monocytes Relative: 8 %
NEUTROS PCT: 70 %
Neutro Abs: 2.9 10*3/uL (ref 1.4–6.5)
PLATELETS: 105 10*3/uL — AB (ref 150–440)
RBC: 2.94 MIL/uL — AB (ref 4.40–5.90)
RDW: 22.6 % — ABNORMAL HIGH (ref 11.5–14.5)
WBC: 4.1 10*3/uL (ref 3.8–10.6)

## 2014-10-12 LAB — MAGNESIUM: MAGNESIUM: 1.6 mg/dL — AB (ref 1.7–2.4)

## 2014-10-12 MED ORDER — SODIUM CHLORIDE 0.9 % IV SOLN
Freq: Once | INTRAVENOUS | Status: AC
  Administered 2014-10-12: 10:00:00 via INTRAVENOUS
  Filled 2014-10-12: qty 1000

## 2014-10-12 MED ORDER — DEXAMETHASONE SODIUM PHOSPHATE 100 MG/10ML IJ SOLN
Freq: Once | INTRAMUSCULAR | Status: AC
  Administered 2014-10-12: 11:00:00 via INTRAVENOUS
  Filled 2014-10-12: qty 4

## 2014-10-12 MED ORDER — PACLITAXEL PROTEIN-BOUND CHEMO INJECTION 100 MG
75.0000 mg/m2 | Freq: Once | INTRAVENOUS | Status: AC
  Administered 2014-10-12: 125 mg via INTRAVENOUS
  Filled 2014-10-12: qty 25

## 2014-10-12 MED ORDER — SODIUM CHLORIDE 0.9 % IV SOLN
1.0000 g | Freq: Once | INTRAVENOUS | Status: AC
  Administered 2014-10-12: 1 g via INTRAVENOUS
  Filled 2014-10-12: qty 2

## 2014-10-12 MED ORDER — HEPARIN SOD (PORK) LOCK FLUSH 100 UNIT/ML IV SOLN
500.0000 [IU] | Freq: Once | INTRAVENOUS | Status: AC | PRN
  Administered 2014-10-12: 500 [IU]
  Filled 2014-10-12: qty 5

## 2014-10-12 MED ORDER — GEMCITABINE HCL CHEMO INJECTION 1 GM/26.3ML
800.0000 mg/m2 | Freq: Once | INTRAVENOUS | Status: AC
  Administered 2014-10-12: 1368 mg via INTRAVENOUS
  Filled 2014-10-12: qty 35.98

## 2014-10-12 MED ORDER — SODIUM CHLORIDE 0.9 % IJ SOLN
10.0000 mL | INTRAMUSCULAR | Status: DC | PRN
  Administered 2014-10-12: 10 mL
  Filled 2014-10-12: qty 10

## 2014-10-12 NOTE — Telephone Encounter (Signed)
Asking for [place] to call to give a verbal order for Speech therapy, OT for bels palsy and home health aid to [place] for respite for wife. PT was ordered when he was discharged form hospital

## 2014-10-12 NOTE — Telephone Encounter (Signed)
Order called in per ok from Dr Mike Gip

## 2014-10-18 ENCOUNTER — Other Ambulatory Visit: Payer: Self-pay

## 2014-10-18 DIAGNOSIS — C252 Malignant neoplasm of tail of pancreas: Secondary | ICD-10-CM

## 2014-10-19 ENCOUNTER — Telehealth: Payer: Self-pay | Admitting: *Deleted

## 2014-10-19 ENCOUNTER — Inpatient Hospital Stay: Payer: Medicare Other

## 2014-10-19 ENCOUNTER — Inpatient Hospital Stay (HOSPITAL_BASED_OUTPATIENT_CLINIC_OR_DEPARTMENT_OTHER): Payer: Medicare Other | Admitting: Hematology and Oncology

## 2014-10-19 ENCOUNTER — Other Ambulatory Visit: Payer: Self-pay | Admitting: Hematology and Oncology

## 2014-10-19 ENCOUNTER — Inpatient Hospital Stay: Payer: Medicare Other | Attending: Hematology and Oncology

## 2014-10-19 ENCOUNTER — Other Ambulatory Visit: Payer: Self-pay | Admitting: *Deleted

## 2014-10-19 VITALS — BP 146/90 | HR 80 | Temp 98.1°F | Ht 67.0 in | Wt 118.3 lb

## 2014-10-19 DIAGNOSIS — G620 Drug-induced polyneuropathy: Secondary | ICD-10-CM | POA: Insufficient documentation

## 2014-10-19 DIAGNOSIS — D696 Thrombocytopenia, unspecified: Secondary | ICD-10-CM | POA: Insufficient documentation

## 2014-10-19 DIAGNOSIS — I10 Essential (primary) hypertension: Secondary | ICD-10-CM

## 2014-10-19 DIAGNOSIS — T451X5S Adverse effect of antineoplastic and immunosuppressive drugs, sequela: Secondary | ICD-10-CM

## 2014-10-19 DIAGNOSIS — R180 Malignant ascites: Secondary | ICD-10-CM | POA: Insufficient documentation

## 2014-10-19 DIAGNOSIS — E039 Hypothyroidism, unspecified: Secondary | ICD-10-CM | POA: Insufficient documentation

## 2014-10-19 DIAGNOSIS — Z79899 Other long term (current) drug therapy: Secondary | ICD-10-CM | POA: Diagnosis not present

## 2014-10-19 DIAGNOSIS — Z794 Long term (current) use of insulin: Secondary | ICD-10-CM | POA: Insufficient documentation

## 2014-10-19 DIAGNOSIS — E119 Type 2 diabetes mellitus without complications: Secondary | ICD-10-CM

## 2014-10-19 DIAGNOSIS — I82891 Chronic embolism and thrombosis of other specified veins: Secondary | ICD-10-CM | POA: Insufficient documentation

## 2014-10-19 DIAGNOSIS — R5383 Other fatigue: Secondary | ICD-10-CM | POA: Insufficient documentation

## 2014-10-19 DIAGNOSIS — J90 Pleural effusion, not elsewhere classified: Secondary | ICD-10-CM | POA: Diagnosis not present

## 2014-10-19 DIAGNOSIS — Z7982 Long term (current) use of aspirin: Secondary | ICD-10-CM | POA: Insufficient documentation

## 2014-10-19 DIAGNOSIS — E785 Hyperlipidemia, unspecified: Secondary | ICD-10-CM | POA: Insufficient documentation

## 2014-10-19 DIAGNOSIS — G510 Bell's palsy: Secondary | ICD-10-CM | POA: Insufficient documentation

## 2014-10-19 DIAGNOSIS — C787 Secondary malignant neoplasm of liver and intrahepatic bile duct: Secondary | ICD-10-CM

## 2014-10-19 DIAGNOSIS — C252 Malignant neoplasm of tail of pancreas: Secondary | ICD-10-CM

## 2014-10-19 DIAGNOSIS — D509 Iron deficiency anemia, unspecified: Secondary | ICD-10-CM

## 2014-10-19 DIAGNOSIS — Z5111 Encounter for antineoplastic chemotherapy: Secondary | ICD-10-CM | POA: Diagnosis not present

## 2014-10-19 DIAGNOSIS — Z7952 Long term (current) use of systemic steroids: Secondary | ICD-10-CM | POA: Diagnosis not present

## 2014-10-19 LAB — CBC WITH DIFFERENTIAL/PLATELET
Basophils Absolute: 0 10*3/uL (ref 0–0.1)
Basophils Relative: 1 %
Eosinophils Absolute: 0 10*3/uL (ref 0–0.7)
Eosinophils Relative: 1 %
HCT: 26 % — ABNORMAL LOW (ref 40.0–52.0)
Hemoglobin: 8.5 g/dL — ABNORMAL LOW (ref 13.0–18.0)
Lymphocytes Relative: 27 %
Lymphs Abs: 1.1 10*3/uL (ref 1.0–3.6)
MCH: 27.4 pg (ref 26.0–34.0)
MCHC: 32.9 g/dL (ref 32.0–36.0)
MCV: 83.1 fL (ref 80.0–100.0)
Monocytes Absolute: 0.3 10*3/uL (ref 0.2–1.0)
Monocytes Relative: 7 %
Neutro Abs: 2.5 10*3/uL (ref 1.4–6.5)
Neutrophils Relative %: 64 %
Platelets: 78 10*3/uL — ABNORMAL LOW (ref 150–440)
RBC: 3.12 MIL/uL — ABNORMAL LOW (ref 4.40–5.90)
RDW: 23 % — ABNORMAL HIGH (ref 11.5–14.5)
WBC: 3.9 10*3/uL (ref 3.8–10.6)

## 2014-10-19 LAB — COMPREHENSIVE METABOLIC PANEL
ALT: 15 U/L — ABNORMAL LOW (ref 17–63)
AST: 22 U/L (ref 15–41)
Albumin: 2.2 g/dL — ABNORMAL LOW (ref 3.5–5.0)
Alkaline Phosphatase: 60 U/L (ref 38–126)
Anion gap: 3 — ABNORMAL LOW (ref 5–15)
BUN: 10 mg/dL (ref 6–20)
CO2: 26 mmol/L (ref 22–32)
Calcium: 8 mg/dL — ABNORMAL LOW (ref 8.9–10.3)
Chloride: 106 mmol/L (ref 101–111)
Creatinine, Ser: 0.71 mg/dL (ref 0.61–1.24)
GFR calc Af Amer: >60 mL/min (ref >60)
GFR calc non Af Amer: >60 mL/min (ref >60)
Glucose, Bld: 228 mg/dL — ABNORMAL HIGH (ref 65–99)
Potassium: 3.8 mmol/L (ref 3.5–5.1)
Sodium: 135 mmol/L (ref 135–145)
Total Bilirubin: 0.4 mg/dL (ref 0.3–1.2)
Total Protein: 6.4 g/dL — ABNORMAL LOW (ref 6.5–8.1)

## 2014-10-19 LAB — MAGNESIUM: Magnesium: 1.6 mg/dL — ABNORMAL LOW (ref 1.7–2.4)

## 2014-10-19 MED ORDER — HEPARIN SOD (PORK) LOCK FLUSH 100 UNIT/ML IV SOLN
500.0000 [IU] | Freq: Once | INTRAVENOUS | Status: AC
  Administered 2014-10-19: 500 [IU] via INTRAVENOUS
  Filled 2014-10-19: qty 5

## 2014-10-19 MED ORDER — SODIUM CHLORIDE 0.9 % IJ SOLN
10.0000 mL | INTRAMUSCULAR | Status: DC | PRN
  Administered 2014-10-19: 10 mL via INTRAVENOUS
  Filled 2014-10-19: qty 10

## 2014-10-19 NOTE — Telephone Encounter (Signed)
Has not been able to see pt this week regarding speech therapy/swallowing concerns. Has a scheduled visit to see pt on Tuesday, 10/24/14.Marland KitchenMarland Kitchen

## 2014-10-19 NOTE — Progress Notes (Signed)
[facility] day:  10/19/2014   Chief Complaint: Harry Roman is a 69 y.o. male with metastatic pancreatic cancer who is seen for assessment on day 22 of cycle #5 gemcitabine and Abraxane.  HPI: The patient was last seen in the medical oncology clinic on 09/28/2014. At that time, he noted stable generalized weakness.  He was gaining weight.  He denied any pain.  He had a new grade II neuropathy.  Abraxane was decreased to level -1. He received day 1 of cycle #5 chemotherapy on 10/05/2014 and day 8 on 10/12/2014.  He was admitted to [facility] on 10/07/2014 with right sided facial droop worrisome for CVA.   Head MRI on 10/08/2014 revealed no CVA.  He was seen by neurology and felt to have Bell's palsy and discharged on 10/09/2014 with a week of steroids.  He has been cleared by speech therapy to eat.  His wife notes that he did not eat for 1 1/2 days awaiting clearance.  He is now eating well and has a good appetite.  Symptomatically, he still has right facial numbness.  Energy level is stable.  Past Medical History  Diagnosis Date  . Hypertension   . Diabetes mellitus without complication   . Hyperlipidemia   . Cancer 2016    pancreas  . Ascites 2016  . 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 Neuropathy 09/30/2014  . Hypothyroidism    Past Surgical History  Procedure Laterality Date  . Hemorrhoid surgery    . Colonoscopy  Jan 2015    Dr Donnella Sham  . Peritoneal fluid aspiration  2016   Family History  Problem Relation Age of Onset  . Hypertension Mother   . Diabetes Mother   . Diabetes Sister   . Diabetes Brother     Social History:  reports that he has never smoked. He does not have any smokeless tobacco history on file. He reports that he does not drink alcohol or use illicit drugs.  The patient is accompanied by his wife.  Allergies: No Known Allergies  Current Medications: Current Outpatient Prescriptions  Medication Sig  Dispense Refill  . aspirin 81 MG tablet Take 81 mg by mouth daily.    . carvedilol (COREG) 12.5 MG tablet Take 12.5 mg by mouth 2 (two) times daily with a meal.    . enalapril (VASOTEC) 20 MG tablet Take 20 mg by mouth daily.    . insulin regular (NOVOLIN R,HUMULIN R) 100 units/mL injection Sliding Scale  Glucose 200-300 take 2 units Glucose 301-400 take 4 units  Glucose 401-500 take 6 units 10 mL 1  . KLOR-CON M20 20 MEQ tablet Take 1 tablet by mouth 2 (two) times daily.    Marland Kitchen levothyroxine (SYNTHROID, LEVOTHROID) 50 MCG tablet Take 50 mcg by mouth daily before breakfast.    . lovastatin (MEVACOR) 10 MG tablet Take 10 mg by mouth at bedtime.    . predniSONE (DELTASONE) 5 MG tablet Take 1 tablet (5 mg total) by mouth daily with breakfast. Prednisone taper label and dispense accordingly Day 1: 30 mg divided as 10 mg before BF, 5 mg at lunch, 5 mg at dinner, 10 mg at qhs  Day 2: 5 mg at BF, 5 mg at lunch, 5 mg at dinner, 10 mg at qhs Day 3: 5 mg 4 times daily (with meals and at bedtime), Day 4: 5 mg 3 times daily (BF, lunch, bedtime) Day 5: 5 mg 2 times daily (  Bf, bedtime) Day 6: 5 mg before BF (Patient not taking: Reported on 10/19/2014) 21 tablet 0   No current facility-administered medications for this visit.   Facility-Administered Medications Ordered in Other Visits  Medication Dose Route Frequency Provider Last Rate Last Dose  . sodium chloride 0.9 % 100 mL with potassium chloride 20 mEq, magnesium sulfate 2 g infusion   Intravenous Continuous Lequita Asal, MD   Stopped at 08/10/14 1655  . sodium chloride 0.9 % injection 10 mL  10 mL Intracatheter PRN Lequita Asal, MD   10 mL at 08/10/14 1410  . sodium chloride 0.9 % injection 10 mL  10 mL Intravenous PRN Lequita Asal, MD   10 mL at 08/31/14 0927  . sodium chloride 0.9 % injection 10 mL  10 mL Intravenous PRN Lequita Asal, MD   10 mL at 09/28/14 1008   Review of Systems:  GENERAL: Fatigue (stable).  No fevers,  sweats or weight loss. PERFORMANCE STATUS (ECOG):  3 HEENT:  No visual changes, runny nose, sore throat, mouth sores or tenderness. Lungs: No shortness of breath or cough.  No hemoptysis. Cardiac:  No chest pain, palpitations, orthopnea, or PND. GI:  Good appetite.   No nausea, vomiting, diarrhea, constipation, melena or hematochezia. GU:  No urgency, frequency, dysuria, or hematuria. Musculoskeletal:  No back pain.  No joint pain.  No muscle tenderness. Extremities:  No lower extremity edema. Skin:  No rashes or skin changes. Neuro:  Bell's palsy.  Stable neuropathy.  No headache, focal weakness, balance or coordination issues. Endocrine:  No diabetes, thyroid issues, hot flashes or night sweats. Psych:  No mood changes, depression or anxiety. Pain:  Pain well controlled. Review of systems:  All other systems reviewed and found to be negative.  Physical Exam: Blood pressure 146/90, pulse 80, temperature 98.1 F (36.7 C), temperature source Tympanic, height _0  (1.702 m), weight 118 lb 4.4 oz (53.65 kg).  GENERAL:  Chronically fatigued elderly gentleman sitting in a wheelchair in the exam room in no acute distress. MENTAL STATUS:  Alert and oriented to person, place and time. HEAD:  Wearing a beige cap.  Alopecia.  Temporal wasting.  Right sided facial droop secondary to Bell's palsy.  Normocephalic, atraumatic, no Cushingoid features. EYES:  Brown eyes.  Pupils equal round and reactive to light and accomodation.  No conjunctivitis or scleral icterus. ENT:  Oropharynx clear without lesion.  Tongue normal. Mucous membranes moist.  RESPIRATORY:  Clear to auscultation without rales, wheezes or rhonchi. CARDIOVASCULAR:  Regular rate and rhythm without murmur, rub or gallop. ABDOMEN: Soft, non-tender, with active bowel sounds, and no appreciable hepatosplenomegaly.  No ascites. SKIN:  No rashes, ulcers or lesions. EXTREMITIES: No lower extremity edema.  No palpable cords. LYMPH NODES: No  palpable cervical, supraclavicular, axillary or inguinal adenopathy  NEUROLOGICAL: Right sided Bell's palsy.  Able to lift eye brow and wrinkle forehead on the right. PSYCH:  Appropriate.  Infusion on 10/19/2014  Component Date Value Ref Range Status  . WBC 10/19/2014 3.9  3.8 - 10.6 K/uL Final   A-LINE DRAW  . RBC 10/19/2014 3.12* 4.40 - 5.90 MIL/uL Final  . Hemoglobin 10/19/2014 8.5* 13.0 - 18.0 g/dL Final  . HCT 10/19/2014 26.0* 40.0 - 52.0 % Final  . MCV 10/19/2014 83.1  80.0 - 100.0 fL Final  . MCH 10/19/2014 27.4  26.0 - 34.0 pg Final  . MCHC 10/19/2014 32.9  32.0 - 36.0 g/dL Final  . RDW 10/19/2014 23.0*  11.5 - 14.5 % Final  . Platelets 10/19/2014 78* 150 - 440 K/uL Final  . Neutrophils Relative % 10/19/2014 64   Final  . Neutro Abs 10/19/2014 2.5  1.4 - 6.5 K/uL Final  . Lymphocytes Relative 10/19/2014 27   Final  . Lymphs Abs 10/19/2014 1.1  1.0 - 3.6 K/uL Final  . Monocytes Relative 10/19/2014 7   Final  . Monocytes Absolute 10/19/2014 0.3  0.2 - 1.0 K/uL Final  . Eosinophils Relative 10/19/2014 1   Final  . Eosinophils Absolute 10/19/2014 0.0  0 - 0.7 K/uL Final  . Basophils Relative 10/19/2014 1   Final  . Basophils Absolute 10/19/2014 0.0  0 - 0.1 K/uL Final  . Sodium 10/19/2014 135  135 - 145 mmol/L Final  . Potassium 10/19/2014 3.8  3.5 - 5.1 mmol/L Final  . Chloride 10/19/2014 106  101 - 111 mmol/L Final  . CO2 10/19/2014 26  22 - 32 mmol/L Final  . Glucose, Bld 10/19/2014 228* 65 - 99 mg/dL Final  . BUN 10/19/2014 10  6 - 20 mg/dL Final  . Creatinine, Ser 10/19/2014 0.71  0.61 - 1.24 mg/dL Final  . Calcium 10/19/2014 8.0* 8.9 - 10.3 mg/dL Final  . Total Protein 10/19/2014 6.4* 6.5 - 8.1 g/dL Final  . Albumin 10/19/2014 2.2* 3.5 - 5.0 g/dL Final  . AST 10/19/2014 22  15 - 41 U/L Final  . ALT 10/19/2014 15* 17 - 63 U/L Final  . Alkaline Phosphatase 10/19/2014 60  38 - 126 U/L Final  . Total Bilirubin 10/19/2014 0.4  0.3 - 1.2 mg/dL Final  . GFR calc non Af Amer  10/19/2014 >60  >60 mL/min Final  . GFR calc Af Amer 10/19/2014 >60  >60 mL/min Final   Comment: (NOTE) The eGFR has been calculated using the CKD EPI equation. This calculation has not been validated in all clinical situations. eGFR's persistently <60 mL/min signify possible Chronic Kidney Disease.   . Anion gap 10/19/2014 3* 5 - 15 Final  . Magnesium 10/19/2014 1.6* 1.7 - 2.4 mg/dL Final    Assessment:  Harry Roman is a 69 y.o. African-American gentleman with metastatic pancreatic cancer.  He presented with unexplained weight loss 1 year, intermittent nausea vomiting 1 month, and a 1 week history of abdominal distention.  He lost 30-40 pounds in the past year.  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 Chest CT on 05/01/2014 revealed no evidence of metastatic disease.  Colonoscopy on 02/2013 was negative.  CA19-9 was 713 on 04/21/2014.  He underwent paracentesis x 2 (1.85 liters on 04/21/2014 and 2.9 liters on 05/01/2014).  Cytology was negative.  Omental biopsy on 04/21/2014 revealed metastatic adenocarcinoma consistent with pancreatic cancer (CA19-9 stains were positive).  He is B12 deficient (B12 was 34 on 04/21/2014).  He is on B12 supplimentation.  He has a microcytic anemia consistent with iron deficiency.  He is status post 5 cycles of gemcitabine and Abraxane (05/04/2014 - 10/05/2014).  Sister Wynona Dove nodule has resolved.  He last required a paracentesis on 05/01/2014.  CA19-9 has decreased from 1322 on 05/04/2014, 738 on 06/01/2014, 365 on 06/29/2014, 157 on 07/27/2014, 176 on 09/21/2014, and 148 on 10/19/2014.  Abdominal and pelvic CT scan on 07/24/2014 revealed  slight interval decrease in the posterior left  hepatic lesion and in the bulkiness of the omental disease.  The portal vein was attenuated, but patent.  There was increased ascites.  He underwent paracentesis on 07/31/2014.  5 liters were removed.  He was started on lasix and potassium.  He was diagnosed with right sided Bell's palsy on 10/09/2014.  He completed a course of steroids.  He has been cleared by speech therapy regarding his oral intake.  Symptomatically, he remains fatigued.  He has a grade II neuropathy (stable).  He is eating well.  He uses Lasix only as needed.  He denies any pain.  His platelet count is low.  Plan: 1. Labs today- CBC with diff, CMP, CA19.9. 2. No chemotherapy today secondary to thrombocytopenia.  Discuss platelet count trend with recent cycles.  Discuss switch to 2 weeks on and 1 week off (rather than 3 weeks on/1week off) 3. Schedule restaging abdominal/pelvic CT scan.  4. RTC  In 1 week for MD assess, labs (CBC with diff, CMP, Mg), review of CT scan, and +/- gemcitabine/Abraxane   Lequita Asal, MD  10/19/2014, 11:07 PM

## 2014-10-19 NOTE — Progress Notes (Signed)
Patient here for follow up. Wife states patient has hernia on rt side that is getting bigger they have not seen family MD.

## 2014-10-20 LAB — CANCER ANTIGEN 19-9: CA 19-9: 148 U/mL — ABNORMAL HIGH (ref 0–35)

## 2014-10-21 ENCOUNTER — Encounter: Payer: Self-pay | Admitting: Hematology and Oncology

## 2014-10-26 ENCOUNTER — Inpatient Hospital Stay: Payer: Medicare Other

## 2014-10-26 ENCOUNTER — Ambulatory Visit
Admission: RE | Admit: 2014-10-26 | Discharge: 2014-10-26 | Disposition: A | Discharge status: Home / Self Care | Payer: Medicare Other | Source: Ambulatory Visit | Attending: Hematology and Oncology | Admitting: Hematology and Oncology

## 2014-10-26 DIAGNOSIS — J90 Pleural effusion, not elsewhere classified: Secondary | ICD-10-CM | POA: Insufficient documentation

## 2014-10-26 DIAGNOSIS — I2510 Atherosclerotic heart disease of native coronary artery without angina pectoris: Secondary | ICD-10-CM | POA: Insufficient documentation

## 2014-10-26 DIAGNOSIS — I517 Cardiomegaly: Secondary | ICD-10-CM | POA: Insufficient documentation

## 2014-10-26 DIAGNOSIS — C252 Malignant neoplasm of tail of pancreas: Secondary | ICD-10-CM

## 2014-10-26 DIAGNOSIS — E538 Deficiency of other specified B group vitamins: Secondary | ICD-10-CM

## 2014-10-26 DIAGNOSIS — D735 Infarction of spleen: Secondary | ICD-10-CM | POA: Insufficient documentation

## 2014-10-26 DIAGNOSIS — Z5111 Encounter for antineoplastic chemotherapy: Secondary | ICD-10-CM | POA: Diagnosis not present

## 2014-10-26 DIAGNOSIS — C259 Malignant neoplasm of pancreas, unspecified: Secondary | ICD-10-CM | POA: Diagnosis not present

## 2014-10-26 MED ORDER — IOHEXOL 300 MG/ML  SOLN
100.0000 mL | Freq: Once | INTRAMUSCULAR | Status: AC | PRN
  Administered 2014-10-26: 100 mL via INTRAVENOUS

## 2014-10-26 MED ORDER — CYANOCOBALAMIN 1000 MCG/ML IJ SOLN
1000.0000 ug | Freq: Once | INTRAMUSCULAR | Status: AC
  Administered 2014-10-26: 1000 ug via INTRAMUSCULAR
  Filled 2014-10-26: qty 1

## 2014-10-27 ENCOUNTER — Telehealth: Payer: Self-pay | Admitting: *Deleted

## 2014-10-27 NOTE — Telephone Encounter (Signed)
  Called patient's wife about results of scan.  Overall improvement. Omental disease improved.  Stable pancrease and liver lesions.  Ascites less.  Lequita Asal, MD

## 2014-10-27 NOTE — Telephone Encounter (Signed)
Wants CT results form yesterday  IMPRESSION: 1. Reduced omental caking, current indistinct tumor about 2.4 cm in thickness, previously 3.7 cm. Extensive ascites. Appearance compatible with peritoneal spread of malignancy although improved. 2. Small right and trace left pleural effusions, both improved compared to prior. 3. Extensive third spacing of fluid in the mesentery and subcutaneous tissues in addition to the ascites. 4. Stable cardiomegaly. 5. Basically stable size of the segment 4B lesion in the liver. 6. Stable size of the hypoenhancing mass in the body and tail of the pancreas. 7. Prominent stool throughout the colon favors constipation. 8. 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   Electronically Signed  By: Van Clines M.D.  On: 10/26/2014 15:18

## 2014-10-31 NOTE — Telephone Encounter (Signed)
Erroneous encounter

## 2014-11-02 ENCOUNTER — Encounter: Payer: Self-pay | Admitting: Hematology and Oncology

## 2014-11-02 ENCOUNTER — Inpatient Hospital Stay: Payer: Medicare Other

## 2014-11-02 ENCOUNTER — Inpatient Hospital Stay (HOSPITAL_BASED_OUTPATIENT_CLINIC_OR_DEPARTMENT_OTHER): Payer: Medicare Other | Admitting: Hematology and Oncology

## 2014-11-02 VITALS — BP 165/81 | HR 76 | Temp 98.5°F | Resp 18

## 2014-11-02 DIAGNOSIS — E538 Deficiency of other specified B group vitamins: Secondary | ICD-10-CM

## 2014-11-02 DIAGNOSIS — D509 Iron deficiency anemia, unspecified: Secondary | ICD-10-CM

## 2014-11-02 DIAGNOSIS — C787 Secondary malignant neoplasm of liver and intrahepatic bile duct: Secondary | ICD-10-CM

## 2014-11-02 DIAGNOSIS — T451X5S Adverse effect of antineoplastic and immunosuppressive drugs, sequela: Secondary | ICD-10-CM

## 2014-11-02 DIAGNOSIS — D696 Thrombocytopenia, unspecified: Secondary | ICD-10-CM | POA: Diagnosis not present

## 2014-11-02 DIAGNOSIS — G510 Bell's palsy: Secondary | ICD-10-CM

## 2014-11-02 DIAGNOSIS — J90 Pleural effusion, not elsewhere classified: Secondary | ICD-10-CM

## 2014-11-02 DIAGNOSIS — R180 Malignant ascites: Secondary | ICD-10-CM | POA: Diagnosis not present

## 2014-11-02 DIAGNOSIS — C252 Malignant neoplasm of tail of pancreas: Secondary | ICD-10-CM

## 2014-11-02 DIAGNOSIS — E119 Type 2 diabetes mellitus without complications: Secondary | ICD-10-CM

## 2014-11-02 DIAGNOSIS — G620 Drug-induced polyneuropathy: Secondary | ICD-10-CM

## 2014-11-02 DIAGNOSIS — C801 Malignant (primary) neoplasm, unspecified: Secondary | ICD-10-CM

## 2014-11-02 DIAGNOSIS — R5383 Other fatigue: Secondary | ICD-10-CM

## 2014-11-02 DIAGNOSIS — Z5111 Encounter for antineoplastic chemotherapy: Secondary | ICD-10-CM | POA: Diagnosis not present

## 2014-11-02 DIAGNOSIS — E039 Hypothyroidism, unspecified: Secondary | ICD-10-CM

## 2014-11-02 DIAGNOSIS — I82891 Chronic embolism and thrombosis of other specified veins: Secondary | ICD-10-CM

## 2014-11-02 DIAGNOSIS — I10 Essential (primary) hypertension: Secondary | ICD-10-CM

## 2014-11-02 LAB — MAGNESIUM: Magnesium: 1.6 mg/dL — ABNORMAL LOW (ref 1.7–2.4)

## 2014-11-02 LAB — CBC WITH DIFFERENTIAL/PLATELET
Basophils Absolute: 0.2 10*3/uL — ABNORMAL HIGH (ref 0–0.1)
Basophils Relative: 3 %
Eosinophils Absolute: 0.1 10*3/uL (ref 0–0.7)
Eosinophils Relative: 1 %
HCT: 26.1 % — ABNORMAL LOW (ref 40.0–52.0)
Hemoglobin: 8.4 g/dL — ABNORMAL LOW (ref 13.0–18.0)
Lymphocytes Relative: 19 %
Lymphs Abs: 0.9 10*3/uL — ABNORMAL LOW (ref 1.0–3.6)
MCH: 27.5 pg (ref 26.0–34.0)
MCHC: 32.3 g/dL (ref 32.0–36.0)
MCV: 85 fL (ref 80.0–100.0)
Monocytes Absolute: 0.7 10*3/uL (ref 0.2–1.0)
Monocytes Relative: 15 %
Neutro Abs: 2.8 10*3/uL (ref 1.4–6.5)
Neutrophils Relative %: 62 %
Platelets: 400 10*3/uL (ref 150–440)
RBC: 3.07 MIL/uL — ABNORMAL LOW (ref 4.40–5.90)
RDW: 21.7 % — ABNORMAL HIGH (ref 11.5–14.5)
WBC: 4.5 10*3/uL (ref 3.8–10.6)

## 2014-11-02 LAB — COMPREHENSIVE METABOLIC PANEL
ALT: 12 U/L — ABNORMAL LOW (ref 17–63)
AST: 27 U/L (ref 15–41)
Albumin: 2.3 g/dL — ABNORMAL LOW (ref 3.5–5.0)
Alkaline Phosphatase: 53 U/L (ref 38–126)
Anion gap: 7 (ref 5–15)
BUN: 14 mg/dL (ref 6–20)
CO2: 27 mmol/L (ref 22–32)
Calcium: 8.6 mg/dL — ABNORMAL LOW (ref 8.9–10.3)
Chloride: 104 mmol/L (ref 101–111)
Creatinine, Ser: 0.89 mg/dL (ref 0.61–1.24)
GFR calc Af Amer: >60 mL/min (ref >60)
GFR calc non Af Amer: >60 mL/min (ref >60)
Glucose, Bld: 283 mg/dL — ABNORMAL HIGH (ref 65–99)
Potassium: 4 mmol/L (ref 3.5–5.1)
Sodium: 138 mmol/L (ref 135–145)
Total Bilirubin: 0.4 mg/dL (ref 0.3–1.2)
Total Protein: 6.6 g/dL (ref 6.5–8.1)

## 2014-11-02 MED ORDER — SODIUM CHLORIDE 0.9 % IV SOLN
Freq: Once | INTRAVENOUS | Status: AC
  Administered 2014-11-02: 11:00:00 via INTRAVENOUS
  Filled 2014-11-02: qty 4

## 2014-11-02 MED ORDER — HEPARIN SOD (PORK) LOCK FLUSH 100 UNIT/ML IV SOLN
500.0000 [IU] | Freq: Once | INTRAVENOUS | Status: AC | PRN
  Administered 2014-11-02: 500 [IU]
  Filled 2014-11-02: qty 5

## 2014-11-02 MED ORDER — PACLITAXEL PROTEIN-BOUND CHEMO INJECTION 100 MG
75.0000 mg/m2 | Freq: Once | INTRAVENOUS | Status: AC
  Administered 2014-11-02: 125 mg via INTRAVENOUS
  Filled 2014-11-02: qty 25

## 2014-11-02 MED ORDER — SODIUM CHLORIDE 0.9 % IV SOLN
1.0000 g | Freq: Once | INTRAVENOUS | Status: AC
  Administered 2014-11-02: 1 g via INTRAVENOUS
  Filled 2014-11-02: qty 2

## 2014-11-02 MED ORDER — SODIUM CHLORIDE 0.9 % IJ SOLN
10.0000 mL | INTRAMUSCULAR | Status: DC | PRN
  Filled 2014-11-02: qty 10

## 2014-11-02 MED ORDER — SODIUM CHLORIDE 0.9 % IV SOLN
Freq: Once | INTRAVENOUS | Status: AC
  Administered 2014-11-02: 11:00:00 via INTRAVENOUS
  Filled 2014-11-02: qty 1000

## 2014-11-02 MED ORDER — SODIUM CHLORIDE 0.9 % IV SOLN
800.0000 mg/m2 | Freq: Once | INTRAVENOUS | Status: AC
  Administered 2014-11-02: 1368 mg via INTRAVENOUS
  Filled 2014-11-02: qty 35.98

## 2014-11-02 NOTE — Progress Notes (Signed)
[place] day:  11/02/2014   Chief Complaint: Harry Roman is a 69 y.o. male with metastatic pancreatic cancer who is seen for assessment prior to cycle #7 gemcitabine and Abraxane.  HPI: The patient was last seen in the medical oncology clinic on 10/19/2014.  At that time, chemotherapy was held secondary to thrombocytopenia.  CA19-9 had decreased to 148.  We discussed a modification in chemotherapy for future cycles (2 weeks on/1 week off).  Decision was made to perform restaging studies.  Abdominal and pelvic CT scan on 10/26/2014 revealed reduced omental caking with the current indistinct tumor about 2.4 cm in thickness (previously 3.7 cm). He has ascites. There was small right and trace left pleural effusion (improved).  There was a stable lesion in segment 4B of the liver. This was a stable hypointense enhancing mass in the body and tail of the pancreas. 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  There was previous notation of attenuation of the portal hepatitis with patent vein.  Symptomatically, he is doing much better. He is now participating in several of his activities of daily living (ADLs).  He now bathes and dresses himself. He denies any pain. He has issues with swallowing secondary to his Bell's palsy.  He mostly eats soft food.  He is gaining weight.  He has been receiving occupational therapy, physical therapy and speech therapy.  All of his therapies complete this week.  Past Medical History  Diagnosis Date  . Hypertension   . Diabetes mellitus without complication   . Hyperlipidemia   . Cancer 2016    pancreas  . Ascites 2016  . 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 Neuropathy 09/30/2014  . Hypothyroidism    Past Surgical History  Procedure Laterality Date  . Hemorrhoid surgery    . Colonoscopy  Jan 2015    Dr Donnella Sham  . Peritoneal fluid aspiration  2016   Family  History  Problem Relation Age of Onset  . Hypertension Mother   . Diabetes Mother   . Diabetes Sister   . Diabetes Brother     Social History:  reports that he has never smoked. He does not have any smokeless tobacco history on file. He reports that he does not drink alcohol or use illicit drugs.  The patient is accompanied by his wife.  Allergies: No Known Allergies  Current Medications: Current Outpatient Prescriptions  Medication Sig Dispense Refill  . aspirin 81 MG tablet Take 81 mg by mouth daily.    . carvedilol (COREG) 12.5 MG tablet Take 12.5 mg by mouth 2 (two) times daily with a meal.    . enalapril (VASOTEC) 20 MG tablet Take 20 mg by mouth daily.    . insulin regular (NOVOLIN R,HUMULIN R) 100 units/mL injection Sliding Scale  Glucose 200-300 take 2 units Glucose 301-400 take 4 units  Glucose 401-500 take 6 units 10 mL 1  . KLOR-CON M20 20 MEQ tablet Take 1 tablet by mouth 2 (two) times daily.    Marland Kitchen levothyroxine (SYNTHROID, LEVOTHROID) 50 MCG tablet Take 50 mcg by mouth daily before breakfast.    . lovastatin (MEVACOR) 10 MG tablet Take 10 mg by mouth at bedtime.    . predniSONE (DELTASONE) 5 MG tablet Take 1 tablet (5 mg total) by mouth daily with breakfast. Prednisone taper label and dispense accordingly Day 1: 30 mg divided as 10 mg before BF, 5 mg  at lunch, 5 mg at dinner, 10 mg at qhs  Day 2: 5 mg at BF, 5 mg at lunch, 5 mg at dinner, 10 mg at qhs Day 3: 5 mg 4 times daily (with meals and at bedtime), Day 4: 5 mg 3 times daily (BF, lunch, bedtime) Day 5: 5 mg 2 times daily (Bf, bedtime) Day 6: 5 mg before BF 21 tablet 0   No current facility-administered medications for this visit.   Facility-Administered Medications Ordered in Other Visits  Medication Dose Route Frequency Provider Last Rate Last Dose  . sodium chloride 0.9 % 100 mL with potassium chloride 20 mEq, magnesium sulfate 2 g infusion   Intravenous Continuous Lequita Asal, MD   Stopped at 08/10/14  1655  . sodium chloride 0.9 % injection 10 mL  10 mL Intracatheter PRN Lequita Asal, MD   10 mL at 08/10/14 1410  . sodium chloride 0.9 % injection 10 mL  10 mL Intravenous PRN Lequita Asal, MD   10 mL at 08/31/14 0927  . sodium chloride 0.9 % injection 10 mL  10 mL Intravenous PRN Lequita Asal, MD   10 mL at 09/28/14 1008   Review of Systems:  GENERAL: Energy level improving.  No fevers or sweats.  Gaining weight. PERFORMANCE STATUS (ECOG):  3 HEENT:  No visual changes, runny nose, sore throat, mouth sores or tenderness. Lungs: No shortness of breath or cough.  No hemoptysis. Cardiac:  No chest pain, palpitations, orthopnea, or PND. GI:  Good appetite.   No nausea, vomiting, diarrhea, constipation, melena or hematochezia. GU:  No urgency, frequency, dysuria, or hematuria. Musculoskeletal:  No back pain.  No joint pain.  No muscle tenderness. Extremities:  No lower extremity edema. Skin:  No rashes or skin changes. Neuro:  Bell's palsy.  Stable neuropathy.  No headache, focal weakness, balance or coordination issues. Endocrine:  No diabetes, thyroid issues, hot flashes or night sweats. Psych:  No mood changes, depression or anxiety. Pain:  No pain. Review of systems:  All other systems reviewed and found to be negative.  Physical Exam: Blood pressure 165/81, pulse 76, temperature 98.5 F (36.9 C), temperature source Tympanic, resp. rate 18.  GENERAL:  Chronically fatigued elderly gentleman sitting in a wheelchair in the exam room in no acute distress. He needs some assistance onto table. MENTAL STATUS:  Alert and oriented to person, place and time. HEAD:  Wearing a black cap.  Alopecia.  Temporal wasting.  Right sided facial droop secondary to Bell's palsy.  Normocephalic, atraumatic, no Cushingoid features. EYES:  Brown eyes.  Pupils equal round and reactive to light and accomodation.  No conjunctivitis or scleral icterus. ENT:  Oropharynx clear without lesion.  Tongue  normal. Mucous membranes moist.  RESPIRATORY:  Decreased breath sounds right base.  Otherwise, clear to auscultation without rales, wheezes or rhonchi. CARDIOVASCULAR:  Regular rate and rhythm without murmur, rub or gallop. ABDOMEN: Soft, non-tender, with active bowel sounds, and no appreciable hepatosplenomegaly.  No shifting dullness. SKIN:  No rashes, ulcers or lesions. EXTREMITIES: No lower extremity edema.  No palpable cords. LYMPH NODES: No palpable cervical, supraclavicular, axillary or inguinal adenopathy  NEUROLOGICAL: Right sided Bell's palsy (stable). PSYCH:  Appropriate.  Infusion on 11/02/2014  Component Date Value Ref Range Status  . WBC 11/02/2014 4.5  3.8 - 10.6 K/uL Final  . RBC 11/02/2014 3.07* 4.40 - 5.90 MIL/uL Final  . Hemoglobin 11/02/2014 8.4* 13.0 - 18.0 g/dL Final  . HCT 11/02/2014 26.1*  40.0 - 52.0 % Final  . MCV 11/02/2014 85.0  80.0 - 100.0 fL Final  . MCH 11/02/2014 27.5  26.0 - 34.0 pg Final  . MCHC 11/02/2014 32.3  32.0 - 36.0 g/dL Final  . RDW 11/02/2014 21.7* 11.5 - 14.5 % Final  . Platelets 11/02/2014 400  150 - 440 K/uL Final  . Neutrophils Relative % 11/02/2014 62   Final  . Neutro Abs 11/02/2014 2.8  1.4 - 6.5 K/uL Final  . Lymphocytes Relative 11/02/2014 19   Final  . Lymphs Abs 11/02/2014 0.9* 1.0 - 3.6 K/uL Final  . Monocytes Relative 11/02/2014 15   Final  . Monocytes Absolute 11/02/2014 0.7  0.2 - 1.0 K/uL Final  . Eosinophils Relative 11/02/2014 1   Final  . Eosinophils Absolute 11/02/2014 0.1  0 - 0.7 K/uL Final  . Basophils Relative 11/02/2014 3   Final  . Basophils Absolute 11/02/2014 0.2* 0 - 0.1 K/uL Final    Assessment:  Harry Roman is a 69 y.o. African-American gentleman with metastatic pancreatic cancer.  He presented with unexplained weight loss 1 year, intermittent nausea vomiting 1 month, and a 1 week history of abdominal distention.  He lost 30-40 pounds in the past year.  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 Chest CT on 05/01/2014 revealed no evidence of metastatic disease.  Colonoscopy on 02/2013 was negative.  CA19-9 was 713 on 04/21/2014.  He underwent paracentesis x 2 (1.85 liters on 04/21/2014 and 2.9 liters on 05/01/2014).  Cytology was negative.  Omental biopsy on 04/21/2014 revealed metastatic adenocarcinoma consistent with pancreatic cancer (CA19-9 stains were positive).  He is B12 deficient (B12 was 34 on 04/21/2014).  He is on B12 supplimentation.  He has a microcytic anemia consistent with iron deficiency.  He is status post 6 cycles of gemcitabine and Abraxane (05/04/2014 - 10/05/2014).  He developed a neuropathy with cycle #5 and thus Abraxane was decreased.  He developed thrombocytopenia with cycle #6 and thus chemotherapy was switched to 2 weeks on/1 week off.  He last required a paracentesis on 07/31/2014.  CA19-9 has decreased from 1322 on 05/04/2014, 738 on 06/01/2014, 365 on 06/29/2014, 157 on 07/27/2014, 176 on 09/21/2014, and 148 on 10/19/2014.  Abdominal and pelvic CT scan on 07/24/2014 revealed slight interval decrease in the posterior left hepatic lesion and in the bulkiness of the omental disease.  The portal vein was attenuated, but patent.  There was increased ascites.  He underwent paracentesis on 07/31/2014.  5 liters were removed.   Abdominal and pelvic CT scan on 10/26/2014 revealed reduced omental caking with the current indistinct tumor about 2.4 cm in thickness (previously 3.7 cm). He has ascites, a small right and trace left pleural effusion (improved).  There was a stable lesion in segment 4B of the liver. There was a stable hypointense enhancing mass in the body and tail of the pancreas. 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  He was diagnosed with right sided Bell's palsy on 10/09/2014.  He completes OT, PT, and speech therapy this week.  Symptomatically, he is doing well.  He is now able to participate in some of his ADLs.  He is gaining weight.  He denies  any pain.  He has a stable grade II neuropathy.  Magnesium is low.  Plan: 1. Labs today: CBC with diff, CMP, CA19.9. 2. Begin cycle #7 gemcitabine and Abraxane. 3. Magnesium sulfate 1 gm IV. 4. RTC in 1 week for labs and day 8 gemcitabine and Abraxane. 5. RTC in 3 weeks for MD assess, labs (CBC with diff, CMP, Mg, CA19-9), monthly B12, and cycle #8 gemcitabine/Abraxane   Lequita Asal, MD  11/02/2014, 9:21 AM

## 2014-11-02 NOTE — Progress Notes (Signed)
Patient is here for follow-up and chemo treatment. Patient states this appetite has gotten better and he has gained some weight.

## 2014-11-09 ENCOUNTER — Other Ambulatory Visit: Payer: Self-pay

## 2014-11-09 ENCOUNTER — Ambulatory Visit: Payer: Medicare Other

## 2014-11-09 ENCOUNTER — Ambulatory Visit: Payer: Medicare Other | Admitting: Hematology and Oncology

## 2014-11-09 ENCOUNTER — Telehealth: Payer: Self-pay

## 2014-11-09 ENCOUNTER — Other Ambulatory Visit: Payer: Medicare Other

## 2014-11-09 ENCOUNTER — Other Ambulatory Visit: Payer: Self-pay | Admitting: Hematology and Oncology

## 2014-11-09 ENCOUNTER — Inpatient Hospital Stay: Payer: Medicare Other

## 2014-11-09 DIAGNOSIS — Z5111 Encounter for antineoplastic chemotherapy: Secondary | ICD-10-CM | POA: Diagnosis not present

## 2014-11-09 DIAGNOSIS — C252 Malignant neoplasm of tail of pancreas: Secondary | ICD-10-CM

## 2014-11-09 DIAGNOSIS — C801 Malignant (primary) neoplasm, unspecified: Secondary | ICD-10-CM

## 2014-11-09 LAB — CBC WITH DIFFERENTIAL/PLATELET
Basophils Absolute: 0 10*3/uL (ref 0–0.1)
Basophils Relative: 1 %
Eosinophils Absolute: 0.1 10*3/uL (ref 0–0.7)
Eosinophils Relative: 1 %
HCT: 27.8 % — ABNORMAL LOW (ref 40.0–52.0)
Hemoglobin: 9 g/dL — ABNORMAL LOW (ref 13.0–18.0)
Lymphocytes Relative: 21 %
Lymphs Abs: 1.2 10*3/uL (ref 1.0–3.6)
MCH: 27.4 pg (ref 26.0–34.0)
MCHC: 32.3 g/dL (ref 32.0–36.0)
MCV: 85 fL (ref 80.0–100.0)
Monocytes Absolute: 0.6 10*3/uL (ref 0.2–1.0)
Monocytes Relative: 10 %
Neutro Abs: 3.7 10*3/uL (ref 1.4–6.5)
Neutrophils Relative %: 67 %
Platelets: 197 10*3/uL (ref 150–440)
RBC: 3.27 MIL/uL — ABNORMAL LOW (ref 4.40–5.90)
RDW: 20.5 % — ABNORMAL HIGH (ref 11.5–14.5)
WBC: 5.5 10*3/uL (ref 3.8–10.6)

## 2014-11-09 LAB — COMPREHENSIVE METABOLIC PANEL
ALT: 19 U/L (ref 17–63)
AST: 32 U/L (ref 15–41)
Albumin: 2.4 g/dL — ABNORMAL LOW (ref 3.5–5.0)
Alkaline Phosphatase: 61 U/L (ref 38–126)
Anion gap: 7 (ref 5–15)
BUN: 14 mg/dL (ref 6–20)
CO2: 26 mmol/L (ref 22–32)
Calcium: 7.9 mg/dL — ABNORMAL LOW (ref 8.9–10.3)
Chloride: 102 mmol/L (ref 101–111)
Creatinine, Ser: 0.66 mg/dL (ref 0.61–1.24)
GFR calc Af Amer: >60 mL/min (ref >60)
GFR calc non Af Amer: >60 mL/min (ref >60)
Glucose, Bld: 346 mg/dL — ABNORMAL HIGH (ref 65–99)
Potassium: 3.5 mmol/L (ref 3.5–5.1)
Sodium: 135 mmol/L (ref 135–145)
Total Bilirubin: 0.3 mg/dL (ref 0.3–1.2)
Total Protein: 7.1 g/dL (ref 6.5–8.1)

## 2014-11-09 LAB — MAGNESIUM: Magnesium: 1.7 mg/dL (ref 1.7–2.4)

## 2014-11-09 MED ORDER — SODIUM CHLORIDE 0.9 % IJ SOLN
10.0000 mL | INTRAMUSCULAR | Status: DC | PRN
  Administered 2014-11-09: 10 mL
  Filled 2014-11-09: qty 10

## 2014-11-09 MED ORDER — SODIUM CHLORIDE 0.9 % IV SOLN
Freq: Once | INTRAVENOUS | Status: AC
  Administered 2014-11-09: 12:00:00 via INTRAVENOUS
  Filled 2014-11-09: qty 1000

## 2014-11-09 MED ORDER — DEXAMETHASONE SODIUM PHOSPHATE 100 MG/10ML IJ SOLN
Freq: Once | INTRAMUSCULAR | Status: AC
  Administered 2014-11-09: 12:00:00 via INTRAVENOUS
  Filled 2014-11-09: qty 4

## 2014-11-09 MED ORDER — HEPARIN SOD (PORK) LOCK FLUSH 100 UNIT/ML IV SOLN
500.0000 [IU] | Freq: Once | INTRAVENOUS | Status: DC
  Filled 2014-11-09: qty 5

## 2014-11-09 MED ORDER — PACLITAXEL PROTEIN-BOUND CHEMO INJECTION 100 MG
75.0000 mg/m2 | Freq: Once | INTRAVENOUS | Status: AC
  Administered 2014-11-09: 125 mg via INTRAVENOUS
  Filled 2014-11-09: qty 25

## 2014-11-09 MED ORDER — GEMCITABINE HCL CHEMO INJECTION 1 GM/26.3ML
800.0000 mg/m2 | Freq: Once | INTRAVENOUS | Status: AC
  Administered 2014-11-09: 1368 mg via INTRAVENOUS
  Filled 2014-11-09: qty 30.72

## 2014-11-09 NOTE — Telephone Encounter (Signed)
Talked with pt's wife about elevated.  BG.  She reports that Harry Roman is on a sliding scale and on chemo days runs high.  She doses according to BG.

## 2014-11-14 ENCOUNTER — Ambulatory Visit: Payer: Medicare Other | Admitting: Hematology and Oncology

## 2014-11-20 ENCOUNTER — Other Ambulatory Visit: Payer: Self-pay

## 2014-11-20 DIAGNOSIS — C252 Malignant neoplasm of tail of pancreas: Secondary | ICD-10-CM

## 2014-11-23 ENCOUNTER — Inpatient Hospital Stay (HOSPITAL_BASED_OUTPATIENT_CLINIC_OR_DEPARTMENT_OTHER): Payer: Medicare Other | Admitting: Hematology and Oncology

## 2014-11-23 ENCOUNTER — Inpatient Hospital Stay: Payer: Medicare Other

## 2014-11-23 ENCOUNTER — Inpatient Hospital Stay: Payer: Medicare Other | Attending: Hematology and Oncology

## 2014-11-23 VITALS — BP 163/87 | HR 62 | Temp 97.8°F | Resp 17 | Ht 67.0 in | Wt 131.3 lb

## 2014-11-23 DIAGNOSIS — C252 Malignant neoplasm of tail of pancreas: Secondary | ICD-10-CM | POA: Diagnosis not present

## 2014-11-23 DIAGNOSIS — Z79899 Other long term (current) drug therapy: Secondary | ICD-10-CM | POA: Diagnosis not present

## 2014-11-23 DIAGNOSIS — R1900 Intra-abdominal and pelvic swelling, mass and lump, unspecified site: Secondary | ICD-10-CM | POA: Insufficient documentation

## 2014-11-23 DIAGNOSIS — G620 Drug-induced polyneuropathy: Secondary | ICD-10-CM

## 2014-11-23 DIAGNOSIS — Z7982 Long term (current) use of aspirin: Secondary | ICD-10-CM | POA: Diagnosis not present

## 2014-11-23 DIAGNOSIS — K649 Unspecified hemorrhoids: Secondary | ICD-10-CM | POA: Insufficient documentation

## 2014-11-23 DIAGNOSIS — Z9181 History of falling: Secondary | ICD-10-CM | POA: Diagnosis not present

## 2014-11-23 DIAGNOSIS — Z5111 Encounter for antineoplastic chemotherapy: Secondary | ICD-10-CM | POA: Diagnosis present

## 2014-11-23 DIAGNOSIS — T451X5S Adverse effect of antineoplastic and immunosuppressive drugs, sequela: Secondary | ICD-10-CM

## 2014-11-23 DIAGNOSIS — C787 Secondary malignant neoplasm of liver and intrahepatic bile duct: Secondary | ICD-10-CM

## 2014-11-23 DIAGNOSIS — G629 Polyneuropathy, unspecified: Secondary | ICD-10-CM

## 2014-11-23 DIAGNOSIS — E119 Type 2 diabetes mellitus without complications: Secondary | ICD-10-CM | POA: Insufficient documentation

## 2014-11-23 DIAGNOSIS — G510 Bell's palsy: Secondary | ICD-10-CM | POA: Insufficient documentation

## 2014-11-23 DIAGNOSIS — E785 Hyperlipidemia, unspecified: Secondary | ICD-10-CM | POA: Diagnosis not present

## 2014-11-23 DIAGNOSIS — E538 Deficiency of other specified B group vitamins: Secondary | ICD-10-CM

## 2014-11-23 DIAGNOSIS — L658 Other specified nonscarring hair loss: Secondary | ICD-10-CM | POA: Diagnosis not present

## 2014-11-23 DIAGNOSIS — I10 Essential (primary) hypertension: Secondary | ICD-10-CM | POA: Insufficient documentation

## 2014-11-23 DIAGNOSIS — Z794 Long term (current) use of insulin: Secondary | ICD-10-CM | POA: Insufficient documentation

## 2014-11-23 DIAGNOSIS — E039 Hypothyroidism, unspecified: Secondary | ICD-10-CM | POA: Insufficient documentation

## 2014-11-23 DIAGNOSIS — C801 Malignant (primary) neoplasm, unspecified: Secondary | ICD-10-CM

## 2014-11-23 LAB — COMPREHENSIVE METABOLIC PANEL
ALT: 17 U/L (ref 17–63)
AST: 27 U/L (ref 15–41)
Albumin: 2.5 g/dL — ABNORMAL LOW (ref 3.5–5.0)
Alkaline Phosphatase: 61 U/L (ref 38–126)
Anion gap: 6 (ref 5–15)
BUN: 15 mg/dL (ref 6–20)
CO2: 26 mmol/L (ref 22–32)
Calcium: 8.3 mg/dL — ABNORMAL LOW (ref 8.9–10.3)
Chloride: 105 mmol/L (ref 101–111)
Creatinine, Ser: 0.77 mg/dL (ref 0.61–1.24)
GFR calc Af Amer: >60 mL/min (ref >60)
GFR calc non Af Amer: >60 mL/min (ref >60)
Glucose, Bld: 318 mg/dL — ABNORMAL HIGH (ref 65–99)
Potassium: 3.9 mmol/L (ref 3.5–5.1)
Sodium: 137 mmol/L (ref 135–145)
Total Bilirubin: 0.4 mg/dL (ref 0.3–1.2)
Total Protein: 6.9 g/dL (ref 6.5–8.1)

## 2014-11-23 LAB — CBC WITH DIFFERENTIAL/PLATELET
Basophils Absolute: 0 10*3/uL (ref 0–0.1)
Basophils Relative: 1 %
Eosinophils Absolute: 0.1 10*3/uL (ref 0–0.7)
Eosinophils Relative: 2 %
HCT: 28.4 % — ABNORMAL LOW (ref 40.0–52.0)
Hemoglobin: 9.2 g/dL — ABNORMAL LOW (ref 13.0–18.0)
Lymphocytes Relative: 20 %
Lymphs Abs: 1.1 10*3/uL (ref 1.0–3.6)
MCH: 27.3 pg (ref 26.0–34.0)
MCHC: 32.4 g/dL (ref 32.0–36.0)
MCV: 84.4 fL (ref 80.0–100.0)
Monocytes Absolute: 0.5 10*3/uL (ref 0.2–1.0)
Monocytes Relative: 10 %
Neutro Abs: 3.6 10*3/uL (ref 1.4–6.5)
Neutrophils Relative %: 67 %
Platelets: 334 10*3/uL (ref 150–440)
RBC: 3.37 MIL/uL — ABNORMAL LOW (ref 4.40–5.90)
RDW: 19.9 % — ABNORMAL HIGH (ref 11.5–14.5)
WBC: 5.4 10*3/uL (ref 3.8–10.6)

## 2014-11-23 LAB — MAGNESIUM: Magnesium: 1.7 mg/dL (ref 1.7–2.4)

## 2014-11-23 MED ORDER — SODIUM CHLORIDE 0.9 % IJ SOLN
10.0000 mL | INTRAMUSCULAR | Status: DC | PRN
  Administered 2014-11-23: 10 mL via INTRAVENOUS
  Filled 2014-11-23: qty 10

## 2014-11-23 MED ORDER — PACLITAXEL PROTEIN-BOUND CHEMO INJECTION 100 MG
60.0000 mg/m2 | Freq: Once | INTRAVENOUS | Status: AC
  Administered 2014-11-23: 100 mg via INTRAVENOUS
  Filled 2014-11-23: qty 20

## 2014-11-23 MED ORDER — SODIUM CHLORIDE 0.9 % IV SOLN
Freq: Once | INTRAVENOUS | Status: AC
  Administered 2014-11-23: 12:00:00 via INTRAVENOUS
  Filled 2014-11-23: qty 4

## 2014-11-23 MED ORDER — HEPARIN SOD (PORK) LOCK FLUSH 100 UNIT/ML IV SOLN
500.0000 [IU] | Freq: Once | INTRAVENOUS | Status: AC
  Administered 2014-11-23: 500 [IU] via INTRAVENOUS
  Filled 2014-11-23: qty 5

## 2014-11-23 MED ORDER — SODIUM CHLORIDE 0.9 % IV SOLN
800.0000 mg/m2 | Freq: Once | INTRAVENOUS | Status: AC
  Administered 2014-11-23: 1368 mg via INTRAVENOUS
  Filled 2014-11-23: qty 30.72

## 2014-11-23 MED ORDER — CYANOCOBALAMIN 1000 MCG/ML IJ SOLN
1000.0000 ug | Freq: Once | INTRAMUSCULAR | Status: AC
  Administered 2014-11-23: 1000 ug via INTRAMUSCULAR
  Filled 2014-11-23: qty 1

## 2014-11-23 MED ORDER — SODIUM CHLORIDE 0.9 % IV SOLN
Freq: Once | INTRAVENOUS | Status: AC
  Administered 2014-11-23: 12:00:00 via INTRAVENOUS
  Filled 2014-11-23: qty 1000

## 2014-11-23 NOTE — Progress Notes (Signed)
NO changes since last visit reports having hemorrhoids that are bothersome.  Right eye watering

## 2014-11-23 NOTE — Progress Notes (Signed)
[facility] day:  11/23/2014   Chief Complaint: Harry Roman is a 69 y.o. male with metastatic pancreatic cancer who is seen for assessment prior to cycle #8 gemcitabine and Abraxane.  HPI: The patient was last seen in the medical oncology clinic on 11/02/2014.  At that time, he was seen for assessment prior to cycle #7.   He was doing well.  He was able to participate in some of his ADLs.  He was gaining weight.  He denied any pain.  He had a stable grade II neuropathy.  Magnesium was low.  He received his scheduled chemotherapy plus IV magnesium.  Symptomatically, he "feels the same, no worse".  He continues to struggle with Bell's palsy.  He has had some hemorrhoidal bleeding.  He notes that his ankles are cold and tingly.  His feet are a little numb.  Weight is stable.  Past Medical History  Diagnosis Date  . Hypertension   . Diabetes mellitus without complication   . Hyperlipidemia   . Cancer 2016    pancreas  . Ascites 2016  . 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 Neuropathy 09/30/2014  . Hypothyroidism    Past Surgical History  Procedure Laterality Date  . Hemorrhoid surgery    . Colonoscopy  Jan 2015    Dr Donnella Sham  . Peritoneal fluid aspiration  2016   Family History  Problem Relation Age of Onset  . Hypertension Mother   . Diabetes Mother   . Diabetes Sister   . Diabetes Brother     Social History:  reports that he has never smoked. He does not have any smokeless tobacco history on file. He reports that he does not drink alcohol or use illicit drugs.  The patient is accompanied by his wife.  Allergies: No Known Allergies  Current Medications: Current Outpatient Prescriptions  Medication Sig Dispense Refill  . aspirin 81 MG tablet Take 81 mg by mouth daily.    . carvedilol (COREG) 12.5 MG tablet Take 12.5 mg by mouth 2 (two) times daily with a meal.    . enalapril (VASOTEC) 20 MG tablet Take 20 mg by  mouth daily.    . insulin regular (NOVOLIN R,HUMULIN R) 100 units/mL injection Sliding Scale  Glucose 200-300 take 2 units Glucose 301-400 take 4 units  Glucose 401-500 take 6 units 10 mL 1  . KLOR-CON M20 20 MEQ tablet Take 1 tablet by mouth 2 (two) times daily.    Marland Kitchen levothyroxine (SYNTHROID, LEVOTHROID) 50 MCG tablet Take 50 mcg by mouth daily before breakfast.    . lovastatin (MEVACOR) 10 MG tablet Take 10 mg by mouth at bedtime.    Marland Kitchen RELION ULTIMA TEST test strip      No current facility-administered medications for this visit.   Facility-Administered Medications Ordered in Other Visits  Medication Dose Route Frequency Provider Last Rate Last Dose  . heparin lock flush 100 unit/mL  500 Units Intravenous Once Lequita Asal, MD      . sodium chloride 0.9 % 100 mL with potassium chloride 20 mEq, magnesium sulfate 2 g infusion   Intravenous Continuous Lequita Asal, MD   Stopped at 08/10/14 1655  . sodium chloride 0.9 % injection 10 mL  10 mL Intracatheter PRN Lequita Asal, MD   10 mL at 08/10/14 1410  . sodium chloride 0.9 % injection 10 mL  10 mL Intravenous PRN Lequita Asal, MD  10 mL at 08/31/14 0927  . sodium chloride 0.9 % injection 10 mL  10 mL Intravenous PRN Lequita Asal, MD   10 mL at 09/28/14 1008  . sodium chloride 0.9 % injection 10 mL  10 mL Intravenous PRN Lequita Asal, MD   10 mL at 11/23/14 8099   Review of Systems:  GENERAL: Feels "the same".  No fevers or sweats.  Weight stable. PERFORMANCE STATUS (ECOG):  3 HEENT:  No visual changes, runny nose, sore throat, mouth sores or tenderness. Lungs: No shortness of breath or cough.  No hemoptysis. Cardiac:  No chest pain, palpitations, orthopnea, or PND. GI:  No nausea, vomiting, diarrhea, constipation, melena or hematochezia. GU:  No urgency, frequency, dysuria, or hematuria. Musculoskeletal:  No back pain.  No joint pain.  No muscle tenderness. Extremities:  No lower extremity  edema. Skin:  No rashes or skin changes. Neuro:  Bell's palsy.  Neuropathy.  Feet a little numb.  No headache, focal weakness, balance or coordination issues. Endocrine:  No diabetes, thyroid issues, hot flashes or night sweats. Psych:  No mood changes, depression or anxiety. Pain:  No pain. Review of systems:  All other systems reviewed and found to be negative.  Physical Exam: Blood pressure 163/87, pulse 62, temperature 97.8 F (36.6 C), temperature source Tympanic, resp. rate 17, height 5' 7"  (1.702 m), weight 131 lb 4.5 oz (59.55 kg).  GENERAL:  Thin elderly gentleman sitting in a wheelchair in the exam room in no acute distress. He needs some assistance onto table. MENTAL STATUS:  Alert and oriented to person, place and time. HEAD:  Wearing a beige cap.  Alopecia.  Temporal wasting.  Right sided facial droop secondary to Bell's palsy.  Normocephalic, atraumatic, no Cushingoid features. EYES:  Brown eyes.  Pupils equal round and reactive to light and accomodation.  No conjunctivitis or scleral icterus. ENT:  Oropharynx clear without lesion.  Tongue normal. Mucous membranes moist.  RESPIRATORY:  Decreased breath sounds right base.  Otherwise, clear to auscultation without rales, wheezes or rhonchi. CARDIOVASCULAR:  Regular rate and rhythm without murmur, rub or gallop. ABDOMEN: Soft, non-tender, with active bowel sounds, and no appreciable hepatosplenomegaly.  No shifting dullness. SKIN:  No rashes, ulcers or lesions. EXTREMITIES: No lower extremity edema.  No palpable cords. LYMPH NODES: No palpable cervical, supraclavicular, axillary or inguinal adenopathy  NEUROLOGICAL: Right sided Bell's palsy (stable). PSYCH:  Appropriate.  Infusion on 11/23/2014  Component Date Value Ref Range Status  . WBC 11/23/2014 5.4  3.8 - 10.6 K/uL Final   A-LINE DRAW  . RBC 11/23/2014 3.37* 4.40 - 5.90 MIL/uL Final  . Hemoglobin 11/23/2014 9.2* 13.0 - 18.0 g/dL Final  . HCT 11/23/2014 28.4* 40.0 -  52.0 % Final  . MCV 11/23/2014 84.4  80.0 - 100.0 fL Final  . MCH 11/23/2014 27.3  26.0 - 34.0 pg Final  . MCHC 11/23/2014 32.4  32.0 - 36.0 g/dL Final  . RDW 11/23/2014 19.9* 11.5 - 14.5 % Final  . Platelets 11/23/2014 334  150 - 440 K/uL Final  . Neutrophils Relative % 11/23/2014 67   Final  . Neutro Abs 11/23/2014 3.6  1.4 - 6.5 K/uL Final  . Lymphocytes Relative 11/23/2014 20   Final  . Lymphs Abs 11/23/2014 1.1  1.0 - 3.6 K/uL Final  . Monocytes Relative 11/23/2014 10   Final  . Monocytes Absolute 11/23/2014 0.5  0.2 - 1.0 K/uL Final  . Eosinophils Relative 11/23/2014 2   Final  .  Eosinophils Absolute 11/23/2014 0.1  0 - 0.7 K/uL Final  . Basophils Relative 11/23/2014 1   Final  . Basophils Absolute 11/23/2014 0.0  0 - 0.1 K/uL Final  . Sodium 11/23/2014 137  135 - 145 mmol/L Final  . Potassium 11/23/2014 3.9  3.5 - 5.1 mmol/L Final  . Chloride 11/23/2014 105  101 - 111 mmol/L Final  . CO2 11/23/2014 26  22 - 32 mmol/L Final  . Glucose, Bld 11/23/2014 318* 65 - 99 mg/dL Final  . BUN 11/23/2014 15  6 - 20 mg/dL Final  . Creatinine, Ser 11/23/2014 0.77  0.61 - 1.24 mg/dL Final  . Calcium 11/23/2014 8.3* 8.9 - 10.3 mg/dL Final  . Total Protein 11/23/2014 6.9  6.5 - 8.1 g/dL Final  . Albumin 11/23/2014 2.5* 3.5 - 5.0 g/dL Final  . AST 11/23/2014 27  15 - 41 U/L Final  . ALT 11/23/2014 17  17 - 63 U/L Final  . Alkaline Phosphatase 11/23/2014 61  38 - 126 U/L Final  . Total Bilirubin 11/23/2014 0.4  0.3 - 1.2 mg/dL Final  . GFR calc non Af Amer 11/23/2014 >60  >60 mL/min Final  . GFR calc Af Amer 11/23/2014 >60  >60 mL/min Final   Comment: (NOTE) The eGFR has been calculated using the CKD EPI equation. This calculation has not been validated in all clinical situations. eGFR's persistently <60 mL/min signify possible Chronic Kidney Disease.   . Anion gap 11/23/2014 6  5 - 15 Final  . Magnesium 11/23/2014 1.7  1.7 - 2.4 mg/dL Final    Assessment:  Harry Roman is a 69 y.o.  African-American gentleman with metastatic pancreatic cancer.  He presented with unexplained weight loss 1 year, intermittent nausea vomiting 1 month, and a 1 week history of abdominal distention.  He lost 30-40 pounds in the past year.  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 Chest CT on 05/01/2014 revealed no evidence of metastatic disease.  Colonoscopy on 02/2013 was negative.  CA19-9 was 713 on 04/21/2014.  He underwent paracentesis x 2 (1.85 liters on 04/21/2014 and 2.9 liters on 05/01/2014).  Cytology was negative.  Omental biopsy on 04/21/2014 revealed metastatic adenocarcinoma consistent with pancreatic cancer (CA19-9 stains were positive).  He is B12 deficient (B12 was 34 on 04/21/2014).  He is on B12 supplimentation.  He has a microcytic anemia consistent with iron deficiency.  He is status post 7 cycles of gemcitabine and Abraxane (05/04/2014 - 11/02/2014).  He developed a neuropathy with cycle #5 and thus Abraxane was decreased.  He developed thrombocytopenia with cycle #6 and thus chemotherapy was switched to 2 weeks on/1 week off.  He last required a paracentesis on 07/31/2014.  CA19-9 has decreased from 1322 on 05/04/2014, 738 on 06/01/2014, 365 on 06/29/2014, 157 on 07/27/2014, 176 on 09/21/2014, and 148 on 10/19/2014.  Abdominal and pelvic CT scan on 07/24/2014 revealed slight interval decrease in the posterior left hepatic lesion and in the bulkiness of the omental disease.  The portal vein was attenuated, but patent.  There was increased ascites.  He underwent paracentesis on 07/31/2014.  5 liters were removed.   Abdominal and pelvic CT scan on 10/26/2014 revealed reduced omental caking with the current indistinct  tumor about 2.4  cm in thickness (previously 3.7 cm). He has ascites, a small right and trace left pleural effusion (improved).  There was a stable lesion in segment 4B of the liver. There was a stable hypointense enhancing mass in the body and tail of the pancreas. 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  He was diagnosed with right sided Bell's palsy on 10/09/2014.   Symptomatically, his neuropathy is a little worse (grade II).  Weight is stable.  He denies any pain.  Plan: 1. Labs today: CBC with diff, CMP, Mg, CA19.9. 2. Discuss neuropathy and quality of life.  Decision to decrease dose of Abraxane.  Patient in agreement. 3. Begin cycle #8 gemcitabine and Abraxane. 4. Continue monthly B12- due today. 5. RTC in 1 week for labs and day 8 gemcitabine and Abraxane. 6. RTC in 3 weeks for MD assess, labs (CBC with diff, CMP, Mg, CA19-9), and cycle #9 gemcitabine/Abraxane.   Lequita Asal, MD  11/23/2014, 11:00 AM

## 2014-11-24 LAB — CANCER ANTIGEN 19-9: CA 19-9: 220 U/mL — ABNORMAL HIGH (ref 0–35)

## 2014-12-01 ENCOUNTER — Inpatient Hospital Stay: Payer: Medicare Other

## 2014-12-01 ENCOUNTER — Other Ambulatory Visit: Payer: Self-pay | Admitting: Hematology and Oncology

## 2014-12-01 ENCOUNTER — Other Ambulatory Visit: Payer: Medicare Other

## 2014-12-01 VITALS — BP 164/89 | HR 74 | Temp 97.8°F | Resp 20

## 2014-12-01 DIAGNOSIS — Z5111 Encounter for antineoplastic chemotherapy: Secondary | ICD-10-CM | POA: Diagnosis not present

## 2014-12-01 DIAGNOSIS — C252 Malignant neoplasm of tail of pancreas: Secondary | ICD-10-CM

## 2014-12-01 DIAGNOSIS — C801 Malignant (primary) neoplasm, unspecified: Secondary | ICD-10-CM

## 2014-12-01 LAB — COMPREHENSIVE METABOLIC PANEL
ALT: 16 U/L — ABNORMAL LOW (ref 17–63)
AST: 27 U/L (ref 15–41)
Albumin: 2.6 g/dL — ABNORMAL LOW (ref 3.5–5.0)
Alkaline Phosphatase: 71 U/L (ref 38–126)
Anion gap: 5 (ref 5–15)
BUN: 12 mg/dL (ref 6–20)
CO2: 27 mmol/L (ref 22–32)
Calcium: 8.4 mg/dL — ABNORMAL LOW (ref 8.9–10.3)
Chloride: 103 mmol/L (ref 101–111)
Creatinine, Ser: 0.73 mg/dL (ref 0.61–1.24)
GFR calc Af Amer: >60 mL/min (ref >60)
GFR calc non Af Amer: >60 mL/min (ref >60)
Glucose, Bld: 351 mg/dL — ABNORMAL HIGH (ref 65–99)
Potassium: 3.5 mmol/L (ref 3.5–5.1)
Sodium: 135 mmol/L (ref 135–145)
Total Bilirubin: 0.2 mg/dL — ABNORMAL LOW (ref 0.3–1.2)
Total Protein: 7 g/dL (ref 6.5–8.1)

## 2014-12-01 LAB — CBC WITH DIFFERENTIAL/PLATELET
Basophils Absolute: 0 10*3/uL (ref 0–0.1)
Basophils Relative: 1 %
Eosinophils Absolute: 0.1 10*3/uL (ref 0–0.7)
Eosinophils Relative: 3 %
HCT: 29.6 % — ABNORMAL LOW (ref 40.0–52.0)
Hemoglobin: 9.6 g/dL — ABNORMAL LOW (ref 13.0–18.0)
Lymphocytes Relative: 22 %
Lymphs Abs: 1 10*3/uL (ref 1.0–3.6)
MCH: 27.2 pg (ref 26.0–34.0)
MCHC: 32.5 g/dL (ref 32.0–36.0)
MCV: 83.6 fL (ref 80.0–100.0)
Monocytes Absolute: 0.7 10*3/uL (ref 0.2–1.0)
Monocytes Relative: 15 %
Neutro Abs: 2.7 10*3/uL (ref 1.4–6.5)
Neutrophils Relative %: 59 %
Platelets: 313 10*3/uL (ref 150–440)
RBC: 3.54 MIL/uL — ABNORMAL LOW (ref 4.40–5.90)
RDW: 19.5 % — ABNORMAL HIGH (ref 11.5–14.5)
WBC: 4.6 10*3/uL (ref 3.8–10.6)

## 2014-12-01 MED ORDER — GEMCITABINE HCL CHEMO INJECTION 1 GM/26.3ML
800.0000 mg/m2 | Freq: Once | INTRAVENOUS | Status: AC
  Administered 2014-12-01: 1368 mg via INTRAVENOUS
  Filled 2014-12-01: qty 30.72

## 2014-12-01 MED ORDER — PACLITAXEL PROTEIN-BOUND CHEMO INJECTION 100 MG
60.0000 mg/m2 | Freq: Once | INTRAVENOUS | Status: AC
  Administered 2014-12-01: 100 mg via INTRAVENOUS
  Filled 2014-12-01: qty 20

## 2014-12-01 MED ORDER — SODIUM CHLORIDE 0.9 % IV SOLN
Freq: Once | INTRAVENOUS | Status: AC
  Administered 2014-12-01: 10:00:00 via INTRAVENOUS
  Filled 2014-12-01: qty 1000

## 2014-12-01 MED ORDER — SODIUM CHLORIDE 0.9 % IV SOLN
Freq: Once | INTRAVENOUS | Status: AC
  Administered 2014-12-01: 10:00:00 via INTRAVENOUS
  Filled 2014-12-01: qty 4

## 2014-12-01 MED ORDER — HEPARIN SOD (PORK) LOCK FLUSH 100 UNIT/ML IV SOLN
500.0000 [IU] | Freq: Once | INTRAVENOUS | Status: AC | PRN
  Administered 2014-12-01: 500 [IU]
  Filled 2014-12-01: qty 5

## 2014-12-01 MED ORDER — SODIUM CHLORIDE 0.9 % IJ SOLN
10.0000 mL | INTRAMUSCULAR | Status: DC | PRN
  Administered 2014-12-01: 10 mL
  Filled 2014-12-01: qty 10

## 2014-12-07 ENCOUNTER — Emergency Department: Payer: Medicare Other

## 2014-12-07 ENCOUNTER — Encounter: Payer: Self-pay | Admitting: Emergency Medicine

## 2014-12-07 ENCOUNTER — Emergency Department
Admission: EM | Admit: 2014-12-07 | Discharge: 2014-12-07 | Disposition: A | Discharge status: Home / Self Care | Payer: Medicare Other | Attending: Emergency Medicine | Admitting: Emergency Medicine

## 2014-12-07 DIAGNOSIS — S3992XA Unspecified injury of lower back, initial encounter: Secondary | ICD-10-CM | POA: Diagnosis present

## 2014-12-07 DIAGNOSIS — W1839XA Other fall on same level, initial encounter: Secondary | ICD-10-CM | POA: Diagnosis not present

## 2014-12-07 DIAGNOSIS — Z79899 Other long term (current) drug therapy: Secondary | ICD-10-CM | POA: Insufficient documentation

## 2014-12-07 DIAGNOSIS — M545 Low back pain, unspecified: Secondary | ICD-10-CM

## 2014-12-07 DIAGNOSIS — Y998 Other external cause status: Secondary | ICD-10-CM | POA: Diagnosis not present

## 2014-12-07 DIAGNOSIS — E119 Type 2 diabetes mellitus without complications: Secondary | ICD-10-CM | POA: Diagnosis not present

## 2014-12-07 DIAGNOSIS — I10 Essential (primary) hypertension: Secondary | ICD-10-CM | POA: Insufficient documentation

## 2014-12-07 DIAGNOSIS — R197 Diarrhea, unspecified: Secondary | ICD-10-CM | POA: Insufficient documentation

## 2014-12-07 DIAGNOSIS — Y9289 Other specified places as the place of occurrence of the external cause: Secondary | ICD-10-CM | POA: Diagnosis not present

## 2014-12-07 DIAGNOSIS — Z7982 Long term (current) use of aspirin: Secondary | ICD-10-CM | POA: Diagnosis not present

## 2014-12-07 DIAGNOSIS — Y9389 Activity, other specified: Secondary | ICD-10-CM | POA: Insufficient documentation

## 2014-12-07 MED ORDER — OXYCODONE-ACETAMINOPHEN 5-325 MG PO TABS: 1.0000 | ORAL_TABLET | Freq: Four times a day (QID) | ORAL | Status: DC | PRN

## 2014-12-07 NOTE — ED Notes (Signed)
Fell x 2 Saturday.  Onto knees and then onto buttocks.  Says got walker caught on loose gravel int he driveway.

## 2014-12-07 NOTE — ED Provider Notes (Signed)
[facility] Emergency Department Provider Note    ____________________________________________  Time seen: 1410  I have reviewed the triage vital signs and the nursing notes.   HISTORY  Chief Complaint Fall   History limited by: Not Limited   HPI Harry Roman is a 69 y.o. male who presents to the emergency department because of concerns for low back pain after a fall. The patient fell 5 days ago. It was a mechanical fall. He was moving his walker over some gravel and fell onto his knees. He then got up and had another mechanical fall where he landed on his buttocks. He has been able to get up and use his walker and support his weight since then. He does state however that since then he has had some persistent low back pain. He denies hitting his head. Denies any change in behavior. Denies any recent nausea, vomiting, chest pain, shortness of breath. Patient has some constant diarrhea.     Past Medical History  Diagnosis Date  . Hypertension   . Diabetes mellitus without complication ([place])   . Hyperlipidemia   . Cancer ([place]) 2016    pancreas  . Ascites 2016  . 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[place]) 05/22/2014  . Neuropathy ([place]) 09/30/2014  . Hypothyroidism     Patient Active Problem List   Diagnosis Date Noted  . Bell's palsy 10/09/2014  . Facial droop 10/07/2014  . DM (diabetes mellitus) ([place]) 10/07/2014  . Neuropathy ([place]) 09/30/2014  . Hypomagnesemia 08/24/2014  . Ascites 07/27/2014  . Hypokalemia 07/27/2014  . Cancer ([place]) 07/13/2014  . Bilateral lower extremity edema 07/13/2014  . B12 deficiency 06/29/2014  . 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[place]) 05/22/2014  . Hypothyroidism 05/22/2014  . Hypertension 05/22/2014  . Hyperlipemia 05/22/2014  . Diabetes mellitus, type 2 ([place]) 05/22/2014    Past Surgical History  Procedure Laterality Date  . Hemorrhoid surgery    . Colonoscopy  Jan 2015    Dr Donnella Sham  . Peritoneal  fluid aspiration  2016    Current Outpatient Rx  Name  Route  Sig  Dispense  Refill  . aspirin 81 MG tablet   Oral   Take 81 mg by mouth daily.         . carvedilol (COREG) 12.5 MG tablet   Oral   Take 12.5 mg by mouth 2 (two) times daily with a meal.         . enalapril (VASOTEC) 20 MG tablet   Oral   Take 20 mg by mouth daily.         . insulin regular (NOVOLIN R,HUMULIN R) 100 units/mL injection      Sliding Scale  Glucose 200-300 take 2 units Glucose 301-400 take 4 units  Glucose 401-500 take 6 units   10 mL   1   . KLOR-CON M20 20 MEQ tablet   Oral   Take 1 tablet by mouth 2 (two) times daily.           Dispense as written.   Marland Kitchen levothyroxine (SYNTHROID, LEVOTHROID) 50 MCG tablet   Oral   Take 50 mcg by mouth daily before breakfast.         . lovastatin (MEVACOR) 10 MG tablet   Oral   Take 10 mg by mouth at bedtime.         Marland Kitchen RELION ULTIMA TEST test strip  Dispense as written.     Allergies Review of patient's allergies indicates no known allergies.  Family History  Problem Relation Age of Onset  . Hypertension Mother   . Diabetes Mother   . Diabetes Sister   . Diabetes Brother     Social History Social History  Substance Use Topics  . Smoking status: Never Smoker   . Smokeless tobacco: None  . Alcohol Use: No    Review of Systems  Constitutional: Negative for fever. Cardiovascular: Negative for chest pain. Respiratory: Negative for shortness of breath. Gastrointestinal: Negative for abdominal pain, vomiting and diarrhea. Genitourinary: Negative for dysuria. Musculoskeletal: Positive for low back pain  Skin: Negative for rash. Neurological: Negative for headaches, focal weakness or numbness.   10-point ROS otherwise negative.  ____________________________________________   PHYSICAL EXAM:  VITAL SIGNS: ED Triage Vitals  Enc Vitals Group     BP 12/07/14 1353 166/92 mmHg     Pulse Rate 12/07/14  1353 68     Resp 12/07/14 1353 16     Temp 12/07/14 1353 98.2 F (36.8 C)     Temp Source 12/07/14 1353 Oral     SpO2 12/07/14 1353 98 %     Weight 12/07/14 1353 130 lb (58.968 kg)     Height 12/07/14 1353 5\' 7"  (1.702 m)     Head Cir --      Peak Flow --      Pain Score 12/07/14 1354 5   Constitutional: Alert and oriented. Well appearing and in no distress. Thin. Eyes: Conjunctivae are normal. PERRL. Normal extraocular movements. ENT   Head: Normocephalic and atraumatic.   Nose: No congestion/rhinnorhea.   Mouth/Throat: Mucous membranes are moist.   Neck: No stridor. Hematological/Lymphatic/Immunilogical: No cervical lymphadenopathy. Cardiovascular: Normal rate, regular rhythm.  No murmurs, rubs, or gallops. Respiratory: Normal respiratory effort without tachypnea nor retractions. Breath sounds are clear and equal bilaterally. No wheezes/rales/rhonchi. Gastrointestinal: Soft and nontender. No distention.  Genitourinary: Deferred Musculoskeletal: Normal range of motion in all extremities. No joint effusions.  No lower extremity tenderness nor edema. Neurologic:  Normal speech and language. No gross focal neurologic deficits are appreciated. Speech is normal.  Skin:  Skin is warm, dry and intact. No rash noted. Psychiatric: Mood and affect are normal. Speech and behavior are normal. Patient exhibits appropriate insight and judgment.  ____________________________________________    LABS (pertinent positives/negatives)  None  ____________________________________________   EKG  None  ____________________________________________    RADIOLOGY  Lumbar spine  IMPRESSION: Spondylosis, without acute finding.  I, Fusako Tanabe, personally viewed and evaluated these images (plain radiographs) as part of my medical decision making. ____________________________________________   PROCEDURES  Procedure(s) performed: None  Critical Care performed:  No  ____________________________________________   INITIAL IMPRESSION / ASSESSMENT AND PLAN / ED COURSE  Pertinent labs & imaging results that were available during my care of the patient were reviewed by me and considered in my medical decision making (see chart for details).  Patient presents to the emergency department today with primary complaint of low back pain after mechanical fall. X-rays without any acute fractures. Will give patient a small course of pain medication.  ____________________________________________   FINAL CLINICAL IMPRESSION(S) / ED DIAGNOSES  Low back pain  Nance Pear, MD 12/07/14 1502

## 2014-12-07 NOTE — Discharge Instructions (Signed)
Please seek medical attention for any high fevers, chest pain, shortness of breath, change in behavior, persistent vomiting, bloody stool or any other new or concerning symptoms.   Back Pain, Adult Back pain is very common in adults.The cause of back pain is rarely dangerous and the pain often gets better over time.The cause of your back pain may not be known. Some common causes of back pain include:  Strain of the muscles or ligaments supporting the spine.  Wear and tear (degeneration) of the spinal disks.  Arthritis.  Direct injury to the back. For many people, back pain may return. Since back pain is rarely dangerous, most people can learn to manage this condition on their own. HOME CARE INSTRUCTIONS Watch your back pain for any changes. The following actions may help to lessen any discomfort you are feeling:  Remain active. It is stressful on your back to sit or stand in one place for long periods of time. Do not sit, drive, or stand in one place for more than 30 minutes at a time. Take short walks on even surfaces as soon as you are able.Try to increase the length of time you walk each day.  Exercise regularly as directed by your health care provider. Exercise helps your back heal faster. It also helps avoid future injury by keeping your muscles strong and flexible.  Do not stay in bed.Resting more than 1-2 days can delay your recovery.  Pay attention to your body when you bend and lift. The most comfortable positions are those that put less stress on your recovering back. Always use proper lifting techniques, including:  Bending your knees.  Keeping the load close to your body.  Avoiding twisting.  Find a comfortable position to sleep. Use a firm mattress and lie on your side with your knees slightly bent. If you lie on your back, put a pillow under your knees.  Avoid feeling anxious or stressed.Stress increases muscle tension and can worsen back pain.It is important to  recognize when you are anxious or stressed and learn ways to manage it, such as with exercise.  Take medicines only as directed by your health care provider. Over-the-counter medicines to reduce pain and inflammation are often the most helpful.Your health care provider may prescribe muscle relaxant drugs.These medicines help dull your pain so you can more quickly return to your normal activities and healthy exercise.  Apply ice to the injured area:  Put ice in a plastic bag.  Place a towel between your skin and the bag.  Leave the ice on for 20 minutes, 2-3 times a day for the first 2-3 days. After that, ice and heat may be alternated to reduce pain and spasms.  Maintain a healthy weight. Excess weight puts extra stress on your back and makes it difficult to maintain good posture. SEEK MEDICAL CARE IF:  You have pain that is not relieved with rest or medicine.  You have increasing pain going down into the legs or buttocks.  You have pain that does not improve in one week.  You have night pain.  You lose weight.  You have a fever or chills. SEEK IMMEDIATE MEDICAL CARE IF:   You develop new bowel or bladder control problems.  You have unusual weakness or numbness in your arms or legs.  You develop nausea or vomiting.  You develop abdominal pain.  You feel faint.   This information is not intended to replace advice given to you by your health care provider. Make  sure you discuss any questions you have with your health care provider.   Document Released: 02/03/2005 Document Revised: 02/24/2014 Document Reviewed: 06/07/2013 Elsevier Interactive Patient Education [employer].

## 2014-12-11 ENCOUNTER — Other Ambulatory Visit: Payer: Self-pay

## 2014-12-11 DIAGNOSIS — C252 Malignant neoplasm of tail of pancreas: Secondary | ICD-10-CM

## 2014-12-14 ENCOUNTER — Encounter: Payer: Self-pay | Admitting: Hematology and Oncology

## 2014-12-14 ENCOUNTER — Inpatient Hospital Stay: Payer: Medicare Other

## 2014-12-14 ENCOUNTER — Inpatient Hospital Stay (HOSPITAL_BASED_OUTPATIENT_CLINIC_OR_DEPARTMENT_OTHER): Payer: Medicare Other | Admitting: Hematology and Oncology

## 2014-12-14 VITALS — BP 167/92 | HR 67 | Temp 96.7°F | Resp 18 | Ht 67.0 in | Wt 133.2 lb

## 2014-12-14 DIAGNOSIS — C252 Malignant neoplasm of tail of pancreas: Secondary | ICD-10-CM

## 2014-12-14 DIAGNOSIS — I10 Essential (primary) hypertension: Secondary | ICD-10-CM

## 2014-12-14 DIAGNOSIS — G620 Drug-induced polyneuropathy: Secondary | ICD-10-CM | POA: Diagnosis not present

## 2014-12-14 DIAGNOSIS — Z9181 History of falling: Secondary | ICD-10-CM

## 2014-12-14 DIAGNOSIS — G510 Bell's palsy: Secondary | ICD-10-CM

## 2014-12-14 DIAGNOSIS — G629 Polyneuropathy, unspecified: Secondary | ICD-10-CM

## 2014-12-14 DIAGNOSIS — Z79899 Other long term (current) drug therapy: Secondary | ICD-10-CM

## 2014-12-14 DIAGNOSIS — L658 Other specified nonscarring hair loss: Secondary | ICD-10-CM

## 2014-12-14 DIAGNOSIS — R1900 Intra-abdominal and pelvic swelling, mass and lump, unspecified site: Secondary | ICD-10-CM

## 2014-12-14 DIAGNOSIS — C787 Secondary malignant neoplasm of liver and intrahepatic bile duct: Secondary | ICD-10-CM

## 2014-12-14 DIAGNOSIS — C801 Malignant (primary) neoplasm, unspecified: Secondary | ICD-10-CM

## 2014-12-14 DIAGNOSIS — Z5111 Encounter for antineoplastic chemotherapy: Secondary | ICD-10-CM | POA: Diagnosis not present

## 2014-12-14 DIAGNOSIS — K649 Unspecified hemorrhoids: Secondary | ICD-10-CM

## 2014-12-14 DIAGNOSIS — T451X5S Adverse effect of antineoplastic and immunosuppressive drugs, sequela: Secondary | ICD-10-CM

## 2014-12-14 DIAGNOSIS — E119 Type 2 diabetes mellitus without complications: Secondary | ICD-10-CM

## 2014-12-14 LAB — COMPREHENSIVE METABOLIC PANEL
ALT: 13 U/L — ABNORMAL LOW (ref 17–63)
AST: 22 U/L (ref 15–41)
Albumin: 2.6 g/dL — ABNORMAL LOW (ref 3.5–5.0)
Alkaline Phosphatase: 76 U/L (ref 38–126)
Anion gap: 4 — ABNORMAL LOW (ref 5–15)
BUN: 13 mg/dL (ref 6–20)
CO2: 26 mmol/L (ref 22–32)
Calcium: 8 mg/dL — ABNORMAL LOW (ref 8.9–10.3)
Chloride: 102 mmol/L (ref 101–111)
Creatinine, Ser: 0.72 mg/dL (ref 0.61–1.24)
GFR calc Af Amer: >60 mL/min (ref >60)
GFR calc non Af Amer: >60 mL/min (ref >60)
Glucose, Bld: 305 mg/dL — ABNORMAL HIGH (ref 65–99)
Potassium: 3.6 mmol/L (ref 3.5–5.1)
Sodium: 132 mmol/L — ABNORMAL LOW (ref 135–145)
Total Bilirubin: 0.3 mg/dL (ref 0.3–1.2)
Total Protein: 7.1 g/dL (ref 6.5–8.1)

## 2014-12-14 LAB — CBC WITH DIFFERENTIAL/PLATELET
Basophils Absolute: 0 10*3/uL (ref 0–0.1)
Basophils Relative: 1 %
Eosinophils Absolute: 0.3 10*3/uL (ref 0–0.7)
Eosinophils Relative: 5 %
HCT: 29.1 % — ABNORMAL LOW (ref 40.0–52.0)
Hemoglobin: 9.5 g/dL — ABNORMAL LOW (ref 13.0–18.0)
Lymphocytes Relative: 18 %
Lymphs Abs: 1.1 10*3/uL (ref 1.0–3.6)
MCH: 27 pg (ref 26.0–34.0)
MCHC: 32.7 g/dL (ref 32.0–36.0)
MCV: 82.5 fL (ref 80.0–100.0)
Monocytes Absolute: 0.6 10*3/uL (ref 0.2–1.0)
Monocytes Relative: 10 %
Neutro Abs: 3.9 10*3/uL (ref 1.4–6.5)
Neutrophils Relative %: 66 %
Platelets: 253 10*3/uL (ref 150–440)
RBC: 3.53 MIL/uL — ABNORMAL LOW (ref 4.40–5.90)
RDW: 18.5 % — ABNORMAL HIGH (ref 11.5–14.5)
WBC: 6 10*3/uL (ref 3.8–10.6)

## 2014-12-14 LAB — MAGNESIUM: Magnesium: 1.7 mg/dL (ref 1.7–2.4)

## 2014-12-14 MED ORDER — SODIUM CHLORIDE 0.9 % IV SOLN
Freq: Once | INTRAVENOUS | Status: AC
  Administered 2014-12-14: 11:00:00 via INTRAVENOUS
  Filled 2014-12-14: qty 4

## 2014-12-14 MED ORDER — PACLITAXEL PROTEIN-BOUND CHEMO INJECTION 100 MG
60.0000 mg/m2 | Freq: Once | INTRAVENOUS | Status: AC
  Administered 2014-12-14: 100 mg via INTRAVENOUS
  Filled 2014-12-14: qty 20

## 2014-12-14 MED ORDER — SODIUM CHLORIDE 0.9 % IV SOLN
Freq: Once | INTRAVENOUS | Status: AC
  Administered 2014-12-14: 11:00:00 via INTRAVENOUS
  Filled 2014-12-14: qty 1000

## 2014-12-14 MED ORDER — SODIUM CHLORIDE 0.9 % IV SOLN
800.0000 mg/m2 | Freq: Once | INTRAVENOUS | Status: AC
  Administered 2014-12-14: 1368 mg via INTRAVENOUS
  Filled 2014-12-14: qty 30.72

## 2014-12-14 MED ORDER — HEPARIN SOD (PORK) LOCK FLUSH 100 UNIT/ML IV SOLN
500.0000 [IU] | Freq: Once | INTRAVENOUS | Status: AC | PRN
  Filled 2014-12-14: qty 5

## 2014-12-14 NOTE — Progress Notes (Signed)
[facility] day:  12/14/2014   Chief Complaint: Harry Roman is a 69 y.o. male with metastatic pancreatic cancer who is seen for assessment prior to cycle #9 gemcitabine and Abraxane.  HPI: The patient was last seen in the medical oncology clinic on 11/23/2014.  At that time, he was seen for assessment prior to cycle #8.   Because of increasing symptomatic neuropathy, decision was made to reduce his Abraxane.  He received his monthly B12.  He received day 1 chemotherapy on 11/23/2014 and day 8 on 12/01/2014.  Symptomatically, he is pretty good. His neuropathy is stable. His swallowing and eating is "okay".  His wife states he fell on Sunday 12/03/2014, on the gravel. He went to the emergency room. X-ray was negative. He was given a prescription prescription for a muscle relaxant.  Past Medical History  Diagnosis Date  . Hypertension   . Diabetes mellitus without complication ([place])   . Hyperlipidemia   . Cancer ([place]) 2016    pancreas  . Ascites 2016  . 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[place]) 05/22/2014  . Neuropathy ([place]) 09/30/2014  . Hypothyroidism    Past Surgical History  Procedure Laterality Date  . Hemorrhoid surgery    . Colonoscopy  Jan 2015    Dr Donnella Sham  . Peritoneal fluid aspiration  2016   Family History  Problem Relation Age of Onset  . Hypertension Mother   . Diabetes Mother   . Diabetes Sister   . Diabetes Brother     Social History:  reports that he has never smoked. He does not have any smokeless tobacco history on file. He reports that he does not drink alcohol or use illicit drugs.  The patient is accompanied by his wife.  Allergies: No Known Allergies  Current Medications: Current Outpatient Prescriptions  Medication Sig Dispense Refill  . aspirin 81 MG tablet Take 81 mg by mouth daily.    . carvedilol (COREG) 12.5 MG tablet Take 12.5 mg by mouth 2 (two) times daily with a meal.    . enalapril (VASOTEC)  20 MG tablet Take 20 mg by mouth daily.    . insulin regular (NOVOLIN R,HUMULIN R) 100 units/mL injection Sliding Scale  Glucose 200-300 take 2 units Glucose 301-400 take 4 units  Glucose 401-500 take 6 units 10 mL 1  . KLOR-CON M20 20 MEQ tablet Take 1 tablet by mouth 2 (two) times daily.    Marland Kitchen levothyroxine (SYNTHROID, LEVOTHROID) 50 MCG tablet Take 50 mcg by mouth daily before breakfast.    . lovastatin (MEVACOR) 10 MG tablet Take 10 mg by mouth at bedtime.    Marland Kitchen oxyCODONE-acetaminophen (ROXICET) 5-325 MG tablet Take 1 tablet by mouth every 6 (six) hours as needed for severe pain. 10 tablet 0  . RELION ULTIMA TEST test strip      No current facility-administered medications for this visit.   Facility-Administered Medications Ordered in Other Visits  Medication Dose Route Frequency Provider Last Rate Last Dose  . sodium chloride 0.9 % 100 mL with potassium chloride 20 mEq, magnesium sulfate 2 g infusion   Intravenous Continuous Lequita Asal, MD   Stopped at 08/10/14 1655  . sodium chloride 0.9 % injection 10 mL  10 mL Intracatheter PRN Lequita Asal, MD   10 mL at 08/10/14 1410  . sodium chloride 0.9 % injection 10 mL  10 mL Intravenous PRN Lequita Asal, MD   10 mL at 08/31/14  7371  . sodium chloride 0.9 % injection 10 mL  10 mL Intravenous PRN Lequita Asal, MD   10 mL at 09/28/14 1008   Review of Systems:  GENERAL: Feels pretty good.  No fevers or sweats.  Weight up 3 pounds. PERFORMANCE STATUS (ECOG):  3 HEENT:  No visual changes, runny nose, sore throat, mouth sores or tenderness. Lungs: No shortness of breath or cough.  No hemoptysis. Cardiac:  No chest pain, palpitations, orthopnea, or PND. GI:  No nausea, vomiting, diarrhea, constipation, melena or hematochezia. GU:  No urgency, frequency, dysuria, or hematuria. Musculoskeletal:  No back pain.  No joint pain.  No muscle tenderness. Extremities:  No lower extremity edema. Skin:  No rashes or skin  changes. Neuro:  Bell's palsy.  Neuropathy.  Feet a little numb (stable).  No headache, focal weakness, balance or coordination issues. Endocrine:  No diabetes, thyroid issues, hot flashes or night sweats. Psych:  No mood changes, depression or anxiety. Pain:  No pain. Review of systems:  All other systems reviewed and found to be negative.  Physical Exam: Blood pressure 167/92, pulse 67, temperature 96.7 F (35.9 C), temperature source Tympanic, resp. rate 18, height 5' 7"  (1.702 m), weight 133 lb 2.5 oz (60.4 kg).  GENERAL:  Thin elderly gentleman sitting in a wheelchair in the exam room in no acute distress. He needs some assistance onto table. MENTAL STATUS:  Alert and oriented to person, place and time. HEAD:  Wearing a blue cap.  Alopecia.  Temporal wasting.  Right sided facial droop secondary to Bell's palsy.  Normocephalic, atraumatic, no Cushingoid features. EYES:  Brown eyes.  Tears pooling in right lower lid secondary to Bell's palsy.  Pupils equal round and reactive to light and accomodation.  No conjunctivitis or scleral icterus. ENT:  Oropharynx clear without lesion.  Dentures.  Tongue normal. Mucous membranes moist.  RESPIRATORY:  Decreased breath sounds right base.  Otherwise, clear to auscultation without rales, wheezes or rhonchi. CARDIOVASCULAR:  Regular rate and rhythm without murmur, rub or gallop. ABDOMEN: Full.  Soft, non-tender, with active bowel sounds, and no appreciable hepatosplenomegaly.   SKIN:  No rashes, ulcers or lesions. EXTREMITIES: No lower extremity edema.  No palpable cords. LYMPH NODES: No palpable cervical, supraclavicular, axillary or inguinal adenopathy  NEUROLOGICAL: Right sided Bell's palsy (stable). PSYCH:  Appropriate.  Infusion on 12/14/2014  Component Date Value Ref Range Status  . WBC 12/14/2014 6.0  3.8 - 10.6 K/uL Final   A-LINE DRAW  . RBC 12/14/2014 3.53* 4.40 - 5.90 MIL/uL Final  . Hemoglobin 12/14/2014 9.5* 13.0 - 18.0 g/dL Final  .  HCT 12/14/2014 29.1* 40.0 - 52.0 % Final  . MCV 12/14/2014 82.5  80.0 - 100.0 fL Final  . MCH 12/14/2014 27.0  26.0 - 34.0 pg Final  . MCHC 12/14/2014 32.7  32.0 - 36.0 g/dL Final  . RDW 12/14/2014 18.5* 11.5 - 14.5 % Final  . Platelets 12/14/2014 253  150 - 440 K/uL Final  . Neutrophils Relative % 12/14/2014 66   Final  . Neutro Abs 12/14/2014 3.9  1.4 - 6.5 K/uL Final  . Lymphocytes Relative 12/14/2014 18   Final  . Lymphs Abs 12/14/2014 1.1  1.0 - 3.6 K/uL Final  . Monocytes Relative 12/14/2014 10   Final  . Monocytes Absolute 12/14/2014 0.6  0.2 - 1.0 K/uL Final  . Eosinophils Relative 12/14/2014 5   Final  . Eosinophils Absolute 12/14/2014 0.3  0 - 0.7 K/uL Final  .  Basophils Relative 12/14/2014 1   Final  . Basophils Absolute 12/14/2014 0.0  0 - 0.1 K/uL Final  . Sodium 12/14/2014 132* 135 - 145 mmol/L Final  . Potassium 12/14/2014 3.6  3.5 - 5.1 mmol/L Final  . Chloride 12/14/2014 102  101 - 111 mmol/L Final  . CO2 12/14/2014 26  22 - 32 mmol/L Final  . Glucose, Bld 12/14/2014 305* 65 - 99 mg/dL Final  . BUN 12/14/2014 13  6 - 20 mg/dL Final  . Creatinine, Ser 12/14/2014 0.72  0.61 - 1.24 mg/dL Final  . Calcium 12/14/2014 8.0* 8.9 - 10.3 mg/dL Final  . Total Protein 12/14/2014 7.1  6.5 - 8.1 g/dL Final  . Albumin 12/14/2014 2.6* 3.5 - 5.0 g/dL Final  . AST 12/14/2014 22  15 - 41 U/L Final  . ALT 12/14/2014 13* 17 - 63 U/L Final  . Alkaline Phosphatase 12/14/2014 76  38 - 126 U/L Final  . Total Bilirubin 12/14/2014 0.3  0.3 - 1.2 mg/dL Final  . GFR calc non Af Amer 12/14/2014 >60  >60 mL/min Final  . GFR calc Af Amer 12/14/2014 >60  >60 mL/min Final   Comment: (NOTE) The eGFR has been calculated using the CKD EPI equation. This calculation has not been validated in all clinical situations. eGFR's persistently <60 mL/min signify possible Chronic Kidney Disease.   . Anion gap 12/14/2014 4* 5 - 15 Final  . Magnesium 12/14/2014 1.7  1.7 - 2.4 mg/dL Final    Assessment:   Harry Roman is a 69 y.o. African-American gentleman with metastatic pancreatic cancer.  He presented with unexplained weight loss 1 year, intermittent nausea vomiting 1 month, and a 1 week history of abdominal distention.  He lost 30-40 pounds in the past year.  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 Chest CT on 05/01/2014 revealed no evidence of metastatic disease.  Colonoscopy on 02/2013 was negative.  CA19-9 was 713 on 04/21/2014.  He underwent paracentesis x 2 (1.85 liters on 04/21/2014 and 2.9 liters on 05/01/2014).  Cytology was negative.  Omental biopsy on 04/21/2014 revealed metastatic adenocarcinoma consistent with pancreatic cancer (CA19-9 stains were positive).  He is B12 deficient (B12 was 34 on 04/21/2014).  He is on B12 supplimentation.  He has a microcytic anemia consistent with iron deficiency.  He is status post 8 cycles of gemcitabine and Abraxane (05/04/2014 - 11/23/2014).  He developed a neuropathy with cycle #5 and thus Abraxane was decreased.  Because of ongoing symptomatic neuropathy, Abraxane was decreased with cycle #8.  He developed thrombocytopenia with cycle #6 and thus chemotherapy was switched to 2 weeks on/1 week off.  He last required a paracentesis on 07/31/2014.  CA19-9 has decreased from 1322 on 05/04/2014, 738 on 06/01/2014, 365 on 06/29/2014, 157 on 07/27/2014, 176 on 09/21/2014, 148 on 10/19/2014, and 220 on 11/23/2014.  Abdominal and pelvic CT scan on 07/24/2014 revealed slight interval decrease in the posterior left hepatic lesion and in the bulkiness of the omental disease.  The portal vein was attenuated, but patent.  There was increased ascites.  He underwent paracentesis on 07/31/2014.  5  liters were removed.   Abdominal and pelvic CT scan on 10/26/2014 revealed reduced omental caking with the current indistinct tumor about 2.4  cm in thickness (previously 3.7 cm). He has ascites, a small right and trace left pleural effusion (improved).  There was a stable lesion in segment 4B of the liver. There was a stable hypointense enhancing mass in the body and tail of the pancreas. 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  He was diagnosed with right sided Bell's palsy on 10/09/2014.   Symptomatically, his neuropathy is stable (grade II).  Weight is up 3 pounds.  He denies any pain.  Abdomen is slightly more full (?ascites).  Plan: 1. Labs today: CBC with diff, CMP, Mg, CA19.9. 2. Discuss neuropathy and quality of life.  Decision to continue current dose of Abraxane. 3. Begin cycle #9 gemcitabine and Abraxane. 4. Continue monthly B12- due today.  Next due 12/21/2014. 5. RTC in 1 week for labs and day 8 gemcitabine and Abraxane. 6. RTC in 3 weeks for MD assess, labs (CBC with diff, CMP, Mg, CA19-9), and cycle #10 gemcitabine/Abraxane.  Addendum:  After the patient's appointment, his CA19-9 returned 382 (higher).  Given his exam suggesting possible increasing ascites, decision made to proceed with CT scan of abdomen and pelvis prior to next cycle.   Lequita Asal, MD  12/14/2014, 2:16 PM

## 2014-12-14 NOTE — Progress Notes (Signed)
Patient is here for follow-up of pancreatic cancer and chemotherapy treatment. Patient states that his appetite has been pretty good and he has gained a couple of pounds. Patient denies any pain.

## 2014-12-15 ENCOUNTER — Other Ambulatory Visit: Payer: Medicare Other

## 2014-12-15 ENCOUNTER — Telehealth: Payer: Self-pay | Admitting: Hematology and Oncology

## 2014-12-15 ENCOUNTER — Ambulatory Visit: Payer: Medicare Other

## 2014-12-15 ENCOUNTER — Other Ambulatory Visit: Payer: Self-pay | Admitting: Hematology and Oncology

## 2014-12-15 ENCOUNTER — Ambulatory Visit: Payer: Medicare Other | Admitting: Hematology and Oncology

## 2014-12-15 DIAGNOSIS — C252 Malignant neoplasm of tail of pancreas: Secondary | ICD-10-CM

## 2014-12-15 LAB — CANCER ANTIGEN 19-9: CA 19-9: 382 U/mL — ABNORMAL HIGH (ref 0–35)

## 2014-12-15 NOTE — Telephone Encounter (Signed)
Re:  Rising tumor marker  Patient's wife notified of rising tumor marker. Order put in for abd/pelvic CT scan on 01/01/2015.  Lequita Asal, MD

## 2014-12-20 ENCOUNTER — Telehealth: Payer: Self-pay | Admitting: *Deleted

## 2014-12-20 NOTE — Telephone Encounter (Signed)
States she is concerned about fld around his heart and he has an appt with Cardiology same afternoon as his chemo appt that morning. AAsking if he should come to his chemo appt

## 2014-12-20 NOTE — Telephone Encounter (Signed)
  Can you call patient's wife?    If he can't make both his cardiology and chemo appt, then we will postpone chemo so he can get to his cardiology appt.  M

## 2014-12-21 NOTE — Telephone Encounter (Signed)
Margreta Journey will call pt back with new appt date/ time Mrs Molden was informed of this

## 2014-12-22 ENCOUNTER — Inpatient Hospital Stay: Payer: Medicare Other

## 2014-12-23 ENCOUNTER — Emergency Department
Admission: EM | Admit: 2014-12-23 | Discharge: 2014-12-23 | Disposition: A | Discharge status: Home / Self Care | Payer: Medicare Other | Attending: Emergency Medicine | Admitting: Emergency Medicine

## 2014-12-23 ENCOUNTER — Encounter: Payer: Self-pay | Admitting: *Deleted

## 2014-12-23 ENCOUNTER — Emergency Department: Payer: Medicare Other

## 2014-12-23 DIAGNOSIS — E1165 Type 2 diabetes mellitus with hyperglycemia: Secondary | ICD-10-CM | POA: Insufficient documentation

## 2014-12-23 DIAGNOSIS — R109 Unspecified abdominal pain: Secondary | ICD-10-CM | POA: Diagnosis not present

## 2014-12-23 DIAGNOSIS — I10 Essential (primary) hypertension: Secondary | ICD-10-CM | POA: Insufficient documentation

## 2014-12-23 DIAGNOSIS — R079 Chest pain, unspecified: Secondary | ICD-10-CM

## 2014-12-23 DIAGNOSIS — Z7982 Long term (current) use of aspirin: Secondary | ICD-10-CM | POA: Insufficient documentation

## 2014-12-23 DIAGNOSIS — Z794 Long term (current) use of insulin: Secondary | ICD-10-CM | POA: Diagnosis not present

## 2014-12-23 DIAGNOSIS — Z79899 Other long term (current) drug therapy: Secondary | ICD-10-CM | POA: Diagnosis not present

## 2014-12-23 DIAGNOSIS — G8929 Other chronic pain: Secondary | ICD-10-CM | POA: Diagnosis not present

## 2014-12-23 DIAGNOSIS — R739 Hyperglycemia, unspecified: Secondary | ICD-10-CM

## 2014-12-23 LAB — CBC
HEMATOCRIT: 29.9 % — AB (ref 40.0–52.0)
HEMOGLOBIN: 9.6 g/dL — AB (ref 13.0–18.0)
MCH: 26.4 pg (ref 26.0–34.0)
MCHC: 32.3 g/dL (ref 32.0–36.0)
MCV: 81.7 fL (ref 80.0–100.0)
Platelets: 239 10*3/uL (ref 150–440)
RBC: 3.65 MIL/uL — AB (ref 4.40–5.90)
RDW: 18.7 % — ABNORMAL HIGH (ref 11.5–14.5)
WBC: 5.3 10*3/uL (ref 3.8–10.6)

## 2014-12-23 LAB — GLUCOSE, CAPILLARY
GLUCOSE-CAPILLARY: 416 mg/dL — AB (ref 65–99)
GLUCOSE-CAPILLARY: 341 mg/dL — AB (ref 65–99)
GLUCOSE-CAPILLARY: 322 mg/dL — AB (ref 65–99)

## 2014-12-23 LAB — BASIC METABOLIC PANEL
ANION GAP: 9 (ref 5–15)
BUN: 10 mg/dL (ref 6–20)
CALCIUM: 8.6 mg/dL — AB (ref 8.9–10.3)
CO2: 27 mmol/L (ref 22–32)
Chloride: 96 mmol/L — ABNORMAL LOW (ref 101–111)
Creatinine, Ser: 0.8 mg/dL (ref 0.61–1.24)
Glucose, Bld: 418 mg/dL — ABNORMAL HIGH (ref 65–99)
POTASSIUM: 3.6 mmol/L (ref 3.5–5.1)
Sodium: 132 mmol/L — ABNORMAL LOW (ref 135–145)

## 2014-12-23 LAB — TROPONIN I

## 2014-12-23 MED ORDER — SODIUM CHLORIDE 0.9 % IV SOLN
1000.0000 mL | Freq: Once | INTRAVENOUS | Status: AC
  Administered 2014-12-23: 1000 mL via INTRAVENOUS

## 2014-12-23 MED ORDER — CARVEDILOL 6.25 MG PO TABS
12.5000 mg | ORAL_TABLET | Freq: Once | ORAL | Status: AC
  Administered 2014-12-23: 12.5 mg via ORAL

## 2014-12-23 MED ORDER — INSULIN ASPART 100 UNIT/ML ~~LOC~~ SOLN
5.0000 [IU] | Freq: Once | SUBCUTANEOUS | Status: AC
  Administered 2014-12-23: 5 [IU] via SUBCUTANEOUS
  Filled 2014-12-23: qty 5

## 2014-12-23 NOTE — ED Notes (Signed)
Blood glucose level 341

## 2014-12-23 NOTE — ED Notes (Addendum)
Pt arrives via EMS from home via [place], states chest pain on and off for 48 hrs radiating across his chest, py is actively being tx for pancreatic, liver and intestinal cancer, pt awake and alert upon arrival, abd distended upon arrival

## 2014-12-23 NOTE — ED Provider Notes (Addendum)
[facility] Emergency Department Provider Note  ____________________________________________  Time seen: On arrival  I have reviewed the triage vital signs and the nursing notes.   HISTORY  Chief Complaint Chest Pain    HPI Harry Roman is a 69 y.o. male who presents with complaints of chest pain. He reports the pain occurred while he was sitting down eating lunch approximately 2 hours prior to arrival. He reports it was a sharp pain that started underneath his left nipple and radiated to his right chest just below the site of his port. Patient is being treated for pancreatic cancer. He recently received chemotherapy one week ago. He denies fevers chills. No chest pain. He reports the pain lasts approximate 5 minutes. No shortness of breath.     Past Medical History  Diagnosis Date  . Hypertension   . Diabetes mellitus without complication ([place])   . Hyperlipidemia   . Cancer ([place]) 2016    pancreas  . Ascites 2016  . 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[place]) 05/22/2014  . Neuropathy ([place]) 09/30/2014  . Hypothyroidism     Patient Active Problem List   Diagnosis Date Noted  . Bell's palsy 10/09/2014  . Facial droop 10/07/2014  . DM (diabetes mellitus) ([place]) 10/07/2014  . Neuropathy ([place]) 09/30/2014  . Hypomagnesemia 08/24/2014  . Ascites 07/27/2014  . Hypokalemia 07/27/2014  . Cancer ([place]) 07/13/2014  . Bilateral lower extremity edema 07/13/2014  . B12 deficiency 06/29/2014  . 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[place]) 05/22/2014  . Hypothyroidism 05/22/2014  . Hypertension 05/22/2014  . Hyperlipemia 05/22/2014  . Diabetes mellitus, type 2 ([place]) 05/22/2014    Past Surgical History  Procedure Laterality Date  . Hemorrhoid surgery    . Colonoscopy  Jan 2015    Dr Donnella Sham  . Peritoneal fluid aspiration  2016    Current Outpatient Rx  Name  Route  Sig  Dispense  Refill  . aspirin 81 MG tablet   Oral   Take 81 mg by mouth daily.          . carvedilol (COREG) 12.5 MG tablet   Oral   Take 12.5 mg by mouth 2 (two) times daily with a meal.         . enalapril (VASOTEC) 20 MG tablet   Oral   Take 20 mg by mouth daily.         . insulin regular (NOVOLIN R,HUMULIN R) 100 units/mL injection      Sliding Scale  Glucose 200-300 take 2 units Glucose 301-400 take 4 units  Glucose 401-500 take 6 units   10 mL   1   . KLOR-CON M20 20 MEQ tablet   Oral   Take 1 tablet by mouth 2 (two) times daily.           Dispense as written.   Marland Kitchen levothyroxine (SYNTHROID, LEVOTHROID) 50 MCG tablet   Oral   Take 50 mcg by mouth daily before breakfast.         . lovastatin (MEVACOR) 10 MG tablet   Oral   Take 10 mg by mouth at bedtime.         Marland Kitchen oxyCODONE-acetaminophen (ROXICET) 5-325 MG tablet   Oral   Take 1 tablet by mouth every 6 (six) hours as needed for severe pain.   10 tablet   0   . RELION ULTIMA TEST test strip  Dispense as written.     Allergies Review of patient's allergies indicates no known allergies.  Family History  Problem Relation Age of Onset  . Hypertension Mother   . Diabetes Mother   . Diabetes Sister   . Diabetes Brother     Social History Social History  Substance Use Topics  . Smoking status: Never Smoker   . Smokeless tobacco: None  . Alcohol Use: No    Review of Systems  Constitutional: Negative for fever. Eyes: Negative for visual changes. ENT: Negative for sore throat Cardiovascular: As above for chest pain Respiratory: Negative for shortness of breath. Gastrointestinal: Chronic abdominal pain Genitourinary: Negative for dysuria. Musculoskeletal: Negative for back pain. Skin: Negative for rash. Neurological: Negative for headaches or focal weakness Psychiatric: No anxiety    ____________________________________________   PHYSICAL EXAM:  VITAL SIGNS: ED Triage Vitals  Enc Vitals Group     BP --      Pulse Rate 12/23/14 1416 70      Resp 12/23/14 1416 18     Temp 12/23/14 1416 97.5 F (36.4 C)     Temp Source 12/23/14 1416 Oral     SpO2 12/23/14 1416 99 %     Weight 12/23/14 1416 130 lb (58.968 kg)     Height 12/23/14 1416 5\' 7"  (1.702 m)     Head Cir --      Peak Flow --      Pain Score 12/23/14 1417 7     Pain Loc --      Pain Edu? --      Excl. in [place]? --      Constitutional: Alert and oriented. Well appearing and in no distress. Eyes: Conjunctivae are normal.  ENT   Head: Normocephalic and atraumatic.   Mouth/Throat: Mucous membranes are moist. Cardiovascular: Normal rate, regular rhythm. Normal and symmetric distal pulses are present in all extremities. No murmurs, rubs, or gallops. Right chest port, no tenderness or redness or discharge Respiratory: Normal respiratory effort without tachypnea nor retractions. Breath sounds are clear and equal bilaterally.  Gastrointestinal: Soft and non-tender in all quadrants. No distention. There is no CVA tenderness. Genitourinary: deferred Musculoskeletal: Nontender with normal range of motion in all extremities. No lower extremity tenderness nor edema. Neurologic:  Normal speech and language. No gross focal neurologic deficits are appreciated. Skin:  Skin is warm, dry and intact. No rash noted. Psychiatric: Mood and affect are normal. Patient exhibits appropriate insight and judgment.  ____________________________________________    LABS (pertinent positives/negatives)  Labs Reviewed  BASIC METABOLIC PANEL - Abnormal; Notable for the following:    Sodium 132 (*)    Chloride 96 (*)    Glucose, Bld 418 (*)    Calcium 8.6 (*)    All other components within normal limits  CBC - Abnormal; Notable for the following:    RBC 3.65 (*)    Hemoglobin 9.6 (*)    HCT 29.9 (*)    RDW 18.7 (*)    All other components within normal limits  TROPONIN I    ____________________________________________   EKG  ED ECG REPORT I, Lavonia Drafts, the attending  physician, personally viewed and interpreted this ECG.   Date: 12/23/2014  EKG Time: 2:17 PM  Rate: 64  Rhythm: normal sinus rhythm  Axis: Normal  Intervals:first-degree A-V block   ST&T Change: Nonspecific   ____________________________________________    RADIOLOGY I have personally reviewed any xrays that were ordered on this patient: Chest x-ray unchanged from prior  ____________________________________________   PROCEDURES  Procedure(s) performed: none  Critical Care performed: none  ____________________________________________   INITIAL IMPRESSION / ASSESSMENT AND PLAN / ED COURSE  Pertinent labs & imaging results that were available during my care of the patient were reviewed by me and considered in my medical decision making (see chart for details).  Patient's initial labs are unremarkable. 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 His chest x-ray shows no change from prior. We will obtain a second troponin and observe the patient on the monitor in the emergency department. He denies pleurisy or shortness of breath. Given his active cancer PE is a possibility but given the lack of symptoms I think is very unlikely  Patient given subcutaneous insulin and fluids for elevated glucose which has improved significant only. He has not had chest pain in the emergency department. Troponins negative 2. Given no shortness of breath, no pleurisy again no need for PE evaluation this time. He has good follow up with oncology  Patient with continued elevated blood pressure, we gave him his home dose of Coreg. He has had difficulty controlling it over the last few weeks. He will see oncology in 3 days for a recheck  ____________________________________________   FINAL CLINICAL IMPRESSION(S) / ED DIAGNOSES  Final diagnoses:  Chest pain, unspecified chest pain type  Hyperglycemia     Lavonia Drafts, MD 12/23/14 1827  Lavonia Drafts, MD 12/23/14 1906

## 2014-12-23 NOTE — ED Notes (Signed)
Reviewed d/c instructions and follow-up care with pt. Pt verbalized understanding 

## 2014-12-23 NOTE — ED Notes (Signed)
Blood sugar 416

## 2014-12-23 NOTE — Discharge Instructions (Signed)
Hyperglycemia High blood sugar (hyperglycemia) means that the level of sugar in your blood is higher than it should be. Signs of high blood sugar include:  Feeling thirsty.  Frequent peeing (urinating).  Feeling tired or sleepy.  Dry mouth.  Vision changes.  Feeling weak.  Feeling hungry but losing weight.  Numbness and tingling in your hands or feet.  Headache. When you ignore these signs, your blood sugar may keep going up. These problems may get worse, and other problems may begin. HOME CARE  Check your blood sugars as told by your doctor. Write down the numbers with the date and time.  Take the right amount of insulin or diabetes pills at the right time. Write down the dose with date and time.  Refill your insulin or diabetes pills before running out.  Watch what you eat. Follow your meal plan.  Drink liquids without sugar, such as water. Check with your doctor if you have kidney or heart disease.  Follow your doctor's orders for exercise. Exercise at the same time of day.  Keep your doctor's appointments. GET HELP RIGHT AWAY IF:   You have trouble thinking or are confused.  You have fast breathing with fruity smelling breath.  You pass out (faint).  You have 2 to 3 days of high blood sugars and you do not know why.  You have chest pain.  You are feeling sick to your stomach (nauseous) or throwing up (vomiting).  You have sudden vision changes. MAKE SURE YOU:   Understand these instructions.  Will watch your condition.  Will get help right away if you are not doing well or get worse.   This information is not intended to replace advice given to you by your health care provider. Make sure you discuss any questions you have with your health care provider.   Document Released: 12/01/2008 Document Revised: 02/24/2014 Document Reviewed: 10/10/2014 Elsevier Interactive Patient Education 2016 [employer]  Nonspecific Chest Pain It is often hard to find  the cause of chest pain. There is always a chance that your pain could be related to something serious, such as a heart attack or a blood clot in your lungs. Chest pain can also be caused by conditions that are not life-threatening. If you have chest pain, it is very important to follow up with your doctor.  HOME CARE  If you were prescribed an antibiotic medicine, finish it all even if you start to feel better.  Avoid any activities that cause chest pain.  Do not use any tobacco products, including cigarettes, chewing tobacco, or electronic cigarettes. If you need help quitting, ask your doctor.  Do not drink alcohol.  Take medicines only as told by your doctor.  Keep all follow-up visits as told by your doctor. This is important. This includes any further testing if your chest pain does not go away.  Your doctor may tell you to keep your head raised (elevated) while you sleep.  Make lifestyle changes as told by your doctor. These may include:  Getting regular exercise. Ask your doctor to suggest some activities that are safe for you.  Eating a heart-healthy diet. Your doctor or a diet specialist (dietitian) can help you to learn healthy eating options.  Maintaining a healthy weight.  Managing diabetes, if necessary.  Reducing stress. GET HELP IF:  Your chest pain does not go away, even after treatment.  You have a rash with blisters on your chest.  You have a fever. GET HELP RIGHT  AWAY IF:  Your chest pain is worse.  You have an increasing cough, or you cough up blood.  You have severe belly (abdominal) pain.  You feel extremely weak.  You pass out (faint).  You have chills.  You have sudden, unexplained chest discomfort.  You have sudden, unexplained discomfort in your arms, back, neck, or jaw.  You have shortness of breath at any time.  You suddenly start to sweat, or your skin gets clammy.  You feel nauseous.  You vomit.  You suddenly feel  light-headed or dizzy.  Your heart begins to beat quickly, or it feels like it is skipping beats. These symptoms may be an emergency. Do not wait to see if the symptoms will go away. Get medical help right away. Call your local emergency services (911 in the [place] Do not drive yourself to the hospital.   This information is not intended to replace advice given to you by your health care provider. Make sure you discuss any questions you have with your health care provider.   Document Released: 07/23/2007 Document Revised: 02/24/2014 Document Reviewed: 09/09/2013 Elsevier Interactive Patient Education [employer].

## 2014-12-23 NOTE — ED Notes (Signed)
Blood glucose 322

## 2014-12-26 ENCOUNTER — Inpatient Hospital Stay: Payer: Medicare Other | Attending: Hematology and Oncology

## 2014-12-26 ENCOUNTER — Inpatient Hospital Stay: Payer: Medicare Other

## 2014-12-26 VITALS — BP 162/90 | HR 73 | Temp 98.0°F

## 2014-12-26 DIAGNOSIS — G629 Polyneuropathy, unspecified: Secondary | ICD-10-CM | POA: Insufficient documentation

## 2014-12-26 DIAGNOSIS — Z79899 Other long term (current) drug therapy: Secondary | ICD-10-CM | POA: Insufficient documentation

## 2014-12-26 DIAGNOSIS — C801 Malignant (primary) neoplasm, unspecified: Secondary | ICD-10-CM

## 2014-12-26 DIAGNOSIS — C799 Secondary malignant neoplasm of unspecified site: Secondary | ICD-10-CM | POA: Diagnosis not present

## 2014-12-26 DIAGNOSIS — R0609 Other forms of dyspnea: Secondary | ICD-10-CM | POA: Diagnosis not present

## 2014-12-26 DIAGNOSIS — Z5111 Encounter for antineoplastic chemotherapy: Secondary | ICD-10-CM | POA: Insufficient documentation

## 2014-12-26 DIAGNOSIS — I10 Essential (primary) hypertension: Secondary | ICD-10-CM | POA: Insufficient documentation

## 2014-12-26 DIAGNOSIS — J90 Pleural effusion, not elsewhere classified: Secondary | ICD-10-CM | POA: Insufficient documentation

## 2014-12-26 DIAGNOSIS — Z7982 Long term (current) use of aspirin: Secondary | ICD-10-CM | POA: Insufficient documentation

## 2014-12-26 DIAGNOSIS — R188 Other ascites: Secondary | ICD-10-CM | POA: Diagnosis not present

## 2014-12-26 DIAGNOSIS — E785 Hyperlipidemia, unspecified: Secondary | ICD-10-CM | POA: Diagnosis not present

## 2014-12-26 DIAGNOSIS — E039 Hypothyroidism, unspecified: Secondary | ICD-10-CM | POA: Diagnosis not present

## 2014-12-26 DIAGNOSIS — C252 Malignant neoplasm of tail of pancreas: Secondary | ICD-10-CM | POA: Diagnosis not present

## 2014-12-26 DIAGNOSIS — G510 Bell's palsy: Secondary | ICD-10-CM | POA: Insufficient documentation

## 2014-12-26 DIAGNOSIS — R634 Abnormal weight loss: Secondary | ICD-10-CM | POA: Diagnosis not present

## 2014-12-26 DIAGNOSIS — E119 Type 2 diabetes mellitus without complications: Secondary | ICD-10-CM | POA: Diagnosis not present

## 2014-12-26 DIAGNOSIS — Z794 Long term (current) use of insulin: Secondary | ICD-10-CM | POA: Diagnosis not present

## 2014-12-26 LAB — COMPREHENSIVE METABOLIC PANEL
ALT: 18 U/L (ref 17–63)
AST: 28 U/L (ref 15–41)
Albumin: 2.6 g/dL — ABNORMAL LOW (ref 3.5–5.0)
Alkaline Phosphatase: 88 U/L (ref 38–126)
Anion gap: 6 (ref 5–15)
BUN: 11 mg/dL (ref 6–20)
CO2: 26 mmol/L (ref 22–32)
Calcium: 8.7 mg/dL — ABNORMAL LOW (ref 8.9–10.3)
Chloride: 103 mmol/L (ref 101–111)
Creatinine, Ser: 0.82 mg/dL (ref 0.61–1.24)
GFR calc Af Amer: >60 mL/min (ref >60)
GFR calc non Af Amer: >60 mL/min (ref >60)
Glucose, Bld: 397 mg/dL — ABNORMAL HIGH (ref 65–99)
Potassium: 3.2 mmol/L — ABNORMAL LOW (ref 3.5–5.1)
Sodium: 135 mmol/L (ref 135–145)
Total Bilirubin: 0.2 mg/dL — ABNORMAL LOW (ref 0.3–1.2)
Total Protein: 7.2 g/dL (ref 6.5–8.1)

## 2014-12-26 LAB — CBC WITH DIFFERENTIAL/PLATELET
Basophils Absolute: 0 10*3/uL (ref 0–0.1)
Basophils Relative: 1 %
Eosinophils Absolute: 0.2 10*3/uL (ref 0–0.7)
Eosinophils Relative: 3 %
HCT: 30.6 % — ABNORMAL LOW (ref 40.0–52.0)
Hemoglobin: 9.8 g/dL — ABNORMAL LOW (ref 13.0–18.0)
Lymphocytes Relative: 17 %
Lymphs Abs: 1 10*3/uL (ref 1.0–3.6)
MCH: 26 pg (ref 26.0–34.0)
MCHC: 32 g/dL (ref 32.0–36.0)
MCV: 81.3 fL (ref 80.0–100.0)
Monocytes Absolute: 0.7 10*3/uL (ref 0.2–1.0)
Monocytes Relative: 11 %
Neutro Abs: 4.1 10*3/uL (ref 1.4–6.5)
Neutrophils Relative %: 68 %
Platelets: 200 10*3/uL (ref 150–440)
RBC: 3.76 MIL/uL — ABNORMAL LOW (ref 4.40–5.90)
RDW: 18.1 % — ABNORMAL HIGH (ref 11.5–14.5)
WBC: 6 10*3/uL (ref 3.8–10.6)

## 2014-12-26 MED ORDER — SODIUM CHLORIDE 0.9 % IV SOLN
800.0000 mg/m2 | Freq: Once | INTRAVENOUS | Status: AC
  Administered 2014-12-26: 1368 mg via INTRAVENOUS
  Filled 2014-12-26: qty 30.72

## 2014-12-26 MED ORDER — SODIUM CHLORIDE 0.9 % IV SOLN
Freq: Once | INTRAVENOUS | Status: AC
  Administered 2014-12-26: 10:00:00 via INTRAVENOUS
  Filled 2014-12-26: qty 1000

## 2014-12-26 MED ORDER — SODIUM CHLORIDE 0.9 % IJ SOLN
10.0000 mL | INTRAMUSCULAR | Status: DC | PRN
  Administered 2014-12-26: 10 mL
  Filled 2014-12-26: qty 10

## 2014-12-26 MED ORDER — PACLITAXEL PROTEIN-BOUND CHEMO INJECTION 100 MG
60.0000 mg/m2 | Freq: Once | INTRAVENOUS | Status: AC
  Administered 2014-12-26: 100 mg via INTRAVENOUS
  Filled 2014-12-26: qty 20

## 2014-12-26 MED ORDER — SODIUM CHLORIDE 0.9 % IV SOLN
Freq: Once | INTRAVENOUS | Status: AC
  Administered 2014-12-26: 11:00:00 via INTRAVENOUS
  Filled 2014-12-26: qty 4

## 2014-12-26 MED ORDER — HEPARIN SOD (PORK) LOCK FLUSH 100 UNIT/ML IV SOLN
500.0000 [IU] | Freq: Once | INTRAVENOUS | Status: AC | PRN
  Administered 2014-12-26: 500 [IU]
  Filled 2014-12-26: qty 5

## 2015-01-01 ENCOUNTER — Ambulatory Visit
Admission: RE | Admit: 2015-01-01 | Discharge: 2015-01-01 | Disposition: A | Discharge status: Home / Self Care | Payer: Medicare Other | Source: Ambulatory Visit | Attending: Hematology and Oncology | Admitting: Hematology and Oncology

## 2015-01-01 DIAGNOSIS — J90 Pleural effusion, not elsewhere classified: Secondary | ICD-10-CM | POA: Diagnosis not present

## 2015-01-01 DIAGNOSIS — C252 Malignant neoplasm of tail of pancreas: Secondary | ICD-10-CM | POA: Insufficient documentation

## 2015-01-01 DIAGNOSIS — Z9221 Personal history of antineoplastic chemotherapy: Secondary | ICD-10-CM | POA: Diagnosis present

## 2015-01-01 DIAGNOSIS — R188 Other ascites: Secondary | ICD-10-CM | POA: Diagnosis not present

## 2015-01-01 DIAGNOSIS — I7102 Dissection of abdominal aorta: Secondary | ICD-10-CM | POA: Diagnosis not present

## 2015-01-01 MED ORDER — IOHEXOL 300 MG/ML  SOLN
100.0000 mL | Freq: Once | INTRAMUSCULAR | Status: AC | PRN
  Administered 2015-01-01: 100 mL via INTRAVENOUS

## 2015-01-02 ENCOUNTER — Telehealth: Payer: Self-pay | Admitting: *Deleted

## 2015-01-02 NOTE — Telephone Encounter (Signed)
Called and explained that results would be given at 11/17 appt

## 2015-01-02 NOTE — Telephone Encounter (Signed)
Called requesting results of CT, pt has fu appt on 11/17.

## 2015-01-04 ENCOUNTER — Inpatient Hospital Stay: Payer: Medicare Other

## 2015-01-04 ENCOUNTER — Ambulatory Visit: Payer: Medicare Other | Admitting: Hematology and Oncology

## 2015-01-04 ENCOUNTER — Ambulatory Visit: Payer: Medicare Other

## 2015-01-04 ENCOUNTER — Other Ambulatory Visit: Payer: Self-pay

## 2015-01-04 ENCOUNTER — Encounter: Payer: Self-pay | Admitting: Hematology and Oncology

## 2015-01-04 ENCOUNTER — Inpatient Hospital Stay (HOSPITAL_BASED_OUTPATIENT_CLINIC_OR_DEPARTMENT_OTHER): Payer: Medicare Other | Admitting: Family Medicine

## 2015-01-04 ENCOUNTER — Other Ambulatory Visit: Payer: Self-pay | Admitting: Hematology and Oncology

## 2015-01-04 VITALS — BP 138/84 | HR 63 | Temp 95.6°F | Resp 18 | Ht 67.0 in | Wt 130.8 lb

## 2015-01-04 DIAGNOSIS — C799 Secondary malignant neoplasm of unspecified site: Secondary | ICD-10-CM | POA: Diagnosis not present

## 2015-01-04 DIAGNOSIS — C252 Malignant neoplasm of tail of pancreas: Secondary | ICD-10-CM

## 2015-01-04 DIAGNOSIS — I10 Essential (primary) hypertension: Secondary | ICD-10-CM

## 2015-01-04 DIAGNOSIS — Z794 Long term (current) use of insulin: Secondary | ICD-10-CM

## 2015-01-04 DIAGNOSIS — E785 Hyperlipidemia, unspecified: Secondary | ICD-10-CM

## 2015-01-04 DIAGNOSIS — C801 Malignant (primary) neoplasm, unspecified: Secondary | ICD-10-CM

## 2015-01-04 DIAGNOSIS — R188 Other ascites: Secondary | ICD-10-CM

## 2015-01-04 DIAGNOSIS — E039 Hypothyroidism, unspecified: Secondary | ICD-10-CM

## 2015-01-04 DIAGNOSIS — R634 Abnormal weight loss: Secondary | ICD-10-CM | POA: Diagnosis not present

## 2015-01-04 DIAGNOSIS — E119 Type 2 diabetes mellitus without complications: Secondary | ICD-10-CM

## 2015-01-04 DIAGNOSIS — E538 Deficiency of other specified B group vitamins: Secondary | ICD-10-CM

## 2015-01-04 DIAGNOSIS — I7102 Dissection of abdominal aorta: Secondary | ICD-10-CM

## 2015-01-04 DIAGNOSIS — R0609 Other forms of dyspnea: Secondary | ICD-10-CM

## 2015-01-04 DIAGNOSIS — J90 Pleural effusion, not elsewhere classified: Secondary | ICD-10-CM

## 2015-01-04 DIAGNOSIS — G510 Bell's palsy: Secondary | ICD-10-CM

## 2015-01-04 LAB — CBC WITH DIFFERENTIAL/PLATELET
Basophils Absolute: 0 10*3/uL (ref 0–0.1)
Basophils Relative: 1 %
Eosinophils Absolute: 0.1 10*3/uL (ref 0–0.7)
Eosinophils Relative: 2 %
HCT: 29.9 % — ABNORMAL LOW (ref 40.0–52.0)
Hemoglobin: 9.6 g/dL — ABNORMAL LOW (ref 13.0–18.0)
Lymphocytes Relative: 22 %
Lymphs Abs: 1.1 10*3/uL (ref 1.0–3.6)
MCH: 25.7 pg — ABNORMAL LOW (ref 26.0–34.0)
MCHC: 32 g/dL (ref 32.0–36.0)
MCV: 80.2 fL (ref 80.0–100.0)
Monocytes Absolute: 0.8 10*3/uL (ref 0.2–1.0)
Monocytes Relative: 17 %
Neutro Abs: 2.8 10*3/uL (ref 1.4–6.5)
Neutrophils Relative %: 58 %
Platelets: 167 10*3/uL (ref 150–440)
RBC: 3.72 MIL/uL — ABNORMAL LOW (ref 4.40–5.90)
RDW: 17.9 % — ABNORMAL HIGH (ref 11.5–14.5)
WBC: 4.9 10*3/uL (ref 3.8–10.6)

## 2015-01-04 LAB — COMPREHENSIVE METABOLIC PANEL
ALT: 15 U/L — ABNORMAL LOW (ref 17–63)
AST: 21 U/L (ref 15–41)
Albumin: 2.5 g/dL — ABNORMAL LOW (ref 3.5–5.0)
Alkaline Phosphatase: 74 U/L (ref 38–126)
Anion gap: 7 (ref 5–15)
BUN: 12 mg/dL (ref 6–20)
CO2: 25 mmol/L (ref 22–32)
Calcium: 8.5 mg/dL — ABNORMAL LOW (ref 8.9–10.3)
Chloride: 101 mmol/L (ref 101–111)
Creatinine, Ser: 0.82 mg/dL (ref 0.61–1.24)
GFR calc Af Amer: >60 mL/min (ref >60)
GFR calc non Af Amer: >60 mL/min (ref >60)
Glucose, Bld: 341 mg/dL — ABNORMAL HIGH (ref 65–99)
Potassium: 3.4 mmol/L — ABNORMAL LOW (ref 3.5–5.1)
Sodium: 133 mmol/L — ABNORMAL LOW (ref 135–145)
Total Bilirubin: 0.4 mg/dL (ref 0.3–1.2)
Total Protein: 6.9 g/dL (ref 6.5–8.1)

## 2015-01-04 LAB — MAGNESIUM: Magnesium: 1.7 mg/dL (ref 1.7–2.4)

## 2015-01-04 MED ORDER — CYANOCOBALAMIN 1000 MCG/ML IJ SOLN
1000.0000 ug | Freq: Once | INTRAMUSCULAR | Status: AC
  Administered 2015-01-04: 1000 ug via INTRAMUSCULAR
  Filled 2015-01-04: qty 1

## 2015-01-04 MED ORDER — SODIUM CHLORIDE 0.9 % IV SOLN
Freq: Once | INTRAVENOUS | Status: AC
  Administered 2015-01-04: 11:00:00 via INTRAVENOUS
  Filled 2015-01-04: qty 1000

## 2015-01-04 MED ORDER — HEPARIN SOD (PORK) LOCK FLUSH 100 UNIT/ML IV SOLN
500.0000 [IU] | Freq: Once | INTRAVENOUS | Status: AC | PRN
  Administered 2015-01-04: 500 [IU]
  Filled 2015-01-04: qty 5

## 2015-01-04 MED ORDER — SODIUM CHLORIDE 0.9 % IV SOLN
Freq: Once | INTRAVENOUS | Status: AC
  Administered 2015-01-04: 11:00:00 via INTRAVENOUS
  Filled 2015-01-04: qty 4

## 2015-01-04 MED ORDER — PACLITAXEL PROTEIN-BOUND CHEMO INJECTION 100 MG
60.0000 mg/m2 | Freq: Once | INTRAVENOUS | Status: AC
  Administered 2015-01-04: 100 mg via INTRAVENOUS
  Filled 2015-01-04: qty 20

## 2015-01-04 MED ORDER — SODIUM CHLORIDE 0.9 % IV SOLN
800.0000 mg/m2 | Freq: Once | INTRAVENOUS | Status: AC
  Administered 2015-01-04: 1368 mg via INTRAVENOUS
  Filled 2015-01-04: qty 30.72

## 2015-01-04 MED ORDER — SODIUM CHLORIDE 0.9 % IJ SOLN
10.0000 mL | Freq: Once | INTRAMUSCULAR | Status: AC
  Administered 2015-01-04: 10 mL via INTRAVENOUS
  Filled 2015-01-04: qty 10

## 2015-01-04 NOTE — Progress Notes (Signed)
[facility]-  Cancer Center  Clinic day:  01/04/2015   Chief Complaint: Harry Roman is a 69 y.o. male with metastatic pancreatic cancer who is seen for assessment prior to cycle #10 gemcitabine and Abraxane.  HPI: The patient was last seen in the medical oncology clinic on 12/14/2014.  At that time, he was seen for assessment prior to cycle #9.   Because of increasing symptomatic neuropathy, decision was made to reduce his Abraxane.  He received his monthly B12.  He received day 1 chemotherapy on 11/23/2014 and day 8 on 12/01/2014.  Symptomatically, he is pretty good. His neuropathy is stable. His swallowing and eating is stable. He noted some dyspnea on exertion yesterday when walking through the mall. Wife states it was mild but he had walked a great distance.   Past Medical History  Diagnosis Date  . Hypertension   . Diabetes mellitus without complication (HCC)   . Hyperlipidemia   . Ascites 2016  . Neuropathy (HCC) 09/30/2014  . Hypothyroidism   . Cancer (HCC) 2016    pancreas  . 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 Hemorrhoid surgery    . Colonoscopy  Jan 2015    Dr Skulski  . Peritoneal fluid aspiration  2016   Family History  Problem Relation Age of Onset  . Hypertension Mother   . Diabetes Mother   . Diabetes Sister   . Diabetes Brother     Social History:  reports that he has never smoked. He does not have any smokeless tobacco history on file. He reports that he does not drink alcohol or use illicit drugs.  The patient is accompanied by his wife.  Allergies: No Known Allergies  Current Medications: Current Outpatient Prescriptions  Medication Sig Dispense Refill  . aspirin 81 MG tablet Take 81 mg by mouth daily.    . carvedilol (COREG) 12.5 MG tablet Take 12.5 mg by mouth 2 (two) times daily with a meal.    . enalapril (VASOTEC) 20 MG tablet Take 20 mg by mouth daily.     . insulin regular (NOVOLIN R,HUMULIN R) 100 units/mL injection Sliding Scale  Glucose 200-300 take 2 units Glucose 301-400 take 4 units  Glucose 401-500 take 6 units 10 mL 1  . KLOR-CON M20 20 MEQ tablet Take 1 tablet by mouth 2 (two) times daily.    . levothyroxine (SYNTHROID, LEVOTHROID) 50 MCG tablet Take 50 mcg by mouth daily before breakfast.    . lovastatin (MEVACOR) 10 MG tablet Take 10 mg by mouth at bedtime.    . oxyCODONE-acetaminophen (ROXICET) 5-325 MG tablet Take 1 tablet by mouth every 6 (six) hours as needed for severe pain. 10 tablet 0  . RELION ULTIMA TEST test strip      No current facility-administered medications for this visit.   Facility-Administered Medications Ordered in Other Visits  Medication Dose Route Frequency Provider Last Rate Last Dose  . sodium chloride 0.9 % 100 mL with potassium chloride 20 mEq, magnesium sulfate 2 g infusion   Intravenous Continuous Melissa C Corcoran, MD   Stopped at 08/10/14 1655  . sodium chloride 0.9 % injection 10 mL  10 mL Intracatheter PRN Melissa C Corcoran, MD   10 mL at 08/10/14 1410  . sodium chloride 0.9 % injection 10 mL  10 mL Intravenous PRN Melissa C Corcoran, MD   10 mL at 08/31/14 0927  . sodium chloride 0.9 %   injection 10 mL  10 mL Intravenous PRN Melissa C Corcoran, MD   10 mL at 09/28/14 1008   Review of Systems:  GENERAL: Feels pretty good.  No fevers or sweats.  Appetite good. PERFORMANCE STATUS (ECOG):  3 HEENT:  No visual changes, runny nose, sore throat, mouth sores or tenderness. Lungs: Mild DOE yesterday with long distance walking.  No hemoptysis. Cardiac:  No chest pain, palpitations, orthopnea, or PND. GI:  No nausea, vomiting, diarrhea, constipation, melena or hematochezia. GU:  No urgency, frequency, dysuria, or hematuria. Musculoskeletal:  No back pain.  No joint pain.  No muscle tenderness. Extremities:  No lower extremity edema. Skin:  No rashes or skin changes. Neuro:  Bell's palsy.   Neuropathy.  Feet a little numb (stable).  No headache, focal weakness, balance or coordination issues. Endocrine:  No diabetes, thyroid issues, hot flashes or night sweats. Psych:  No mood changes, depression or anxiety. Pain:  No pain. Review of systems:  All other systems reviewed and found to be negative.  Physical Exam: Blood pressure 138/84, pulse 63, temperature 95.6 F (35.3 C), temperature source Tympanic, resp. rate 18, height 5' 7" (1.702 m), weight 130 lb 13.5 oz (59.35 kg).  GENERAL:  Thin elderly gentleman sitting in a wheelchair in the exam room in no acute distress. He needs some assistance onto table. MENTAL STATUS:  Alert and oriented to person, place and time. HEAD:  Wearing a blue cap.  Alopecia.  Temporal wasting.  Right sided facial droop secondary to Bell's palsy.  Normocephalic, atraumatic, no Cushingoid features. EYES:  Brown eyes.  Tears pooling in right lower lid secondary to Bell's palsy.  Pupils equal round and reactive to light and accomodation.  No conjunctivitis or scleral icterus. ENT:  Oropharynx clear without lesion.  Dentures.  Tongue normal. Mucous membranes moist.  RESPIRATORY:  Decreased breath sounds right base.  Otherwise, clear to auscultation without rales, wheezes or rhonchi. CARDIOVASCULAR:  Regular rate and rhythm without murmur, rub or gallop. ABDOMEN: Full.  Soft, non-tender, with active bowel sounds, and no appreciable hepatosplenomegaly.   SKIN:  No rashes, ulcers or lesions. EXTREMITIES: No lower extremity edema.  No palpable cords. LYMPH NODES: No palpable cervical, supraclavicular, axillary or inguinal adenopathy  NEUROLOGICAL: Right sided Bell's palsy (stable). PSYCH:  Appropriate.  Infusion on 01/04/2015  Component Date Value Ref Range Status  . WBC 01/04/2015 4.9  3.8 - 10.6 K/uL Final  . RBC 01/04/2015 3.72* 4.40 - 5.90 MIL/uL Final  . Hemoglobin 01/04/2015 9.6* 13.0 - 18.0 g/dL Final  . HCT 01/04/2015 29.9* 40.0 - 52.0 % Final  .  MCV 01/04/2015 80.2  80.0 - 100.0 fL Final  . MCH 01/04/2015 25.7* 26.0 - 34.0 pg Final  . MCHC 01/04/2015 32.0  32.0 - 36.0 g/dL Final  . RDW 01/04/2015 17.9* 11.5 - 14.5 % Final  . Platelets 01/04/2015 167  150 - 440 K/uL Final  . Neutrophils Relative % 01/04/2015 58   Final  . Neutro Abs 01/04/2015 2.8  1.4 - 6.5 K/uL Final  . Lymphocytes Relative 01/04/2015 22   Final  . Lymphs Abs 01/04/2015 1.1  1.0 - 3.6 K/uL Final  . Monocytes Relative 01/04/2015 17   Final  . Monocytes Absolute 01/04/2015 0.8  0.2 - 1.0 K/uL Final  . Eosinophils Relative 01/04/2015 2   Final  . Eosinophils Absolute 01/04/2015 0.1  0 - 0.7 K/uL Final  . Basophils Relative 01/04/2015 1   Final  . Basophils Absolute 01/04/2015 0.0    0 - 0.1 K/uL Final  . Sodium 01/04/2015 133* 135 - 145 mmol/L Final  . Potassium 01/04/2015 3.4* 3.5 - 5.1 mmol/L Final  . Chloride 01/04/2015 101  101 - 111 mmol/L Final  . CO2 01/04/2015 25  22 - 32 mmol/L Final  . Glucose, Bld 01/04/2015 341* 65 - 99 mg/dL Final  . BUN 01/04/2015 12  6 - 20 mg/dL Final  . Creatinine, Ser 01/04/2015 0.82  0.61 - 1.24 mg/dL Final  . Calcium 01/04/2015 8.5* 8.9 - 10.3 mg/dL Final  . Total Protein 01/04/2015 6.9  6.5 - 8.1 g/dL Final  . Albumin 01/04/2015 2.5* 3.5 - 5.0 g/dL Final  . AST 01/04/2015 21  15 - 41 U/L Final  . ALT 01/04/2015 15* 17 - 63 U/L Final  . Alkaline Phosphatase 01/04/2015 74  38 - 126 U/L Final  . Total Bilirubin 01/04/2015 0.4  0.3 - 1.2 mg/dL Final  . GFR calc non Af Amer 01/04/2015 >60  >60 mL/min Final  . GFR calc Af Amer 01/04/2015 >60  >60 mL/min Final   Comment: (NOTE) The eGFR has been calculated using the CKD EPI equation. This calculation has not been validated in all clinical situations. eGFR's persistently <60 mL/min signify possible Chronic Kidney Disease.   . Anion gap 01/04/2015 7  5 - 15 Final  . Magnesium 01/04/2015 1.7  1.7 - 2.4 mg/dL Final    Assessment:  Jadian J Zegers is a 69 y.o. African-American  gentleman with metastatic pancreatic cancer.  He presented with unexplained weight loss 1 year, intermittent nausea vomiting 1 month, and a 1 week history of abdominal distention.  He lost 30-40 pounds in the past year.  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 Chest CT on 05/01/2014 revealed no evidence of metastatic disease.  Colonoscopy on 02/2013 was negative.  CA19-9 was 713 on 04/21/2014.  He underwent paracentesis x 2 (1.85 liters on 04/21/2014 and 2.9 liters on 05/01/2014).  Cytology was negative.  Omental biopsy on 04/21/2014 revealed metastatic adenocarcinoma consistent with pancreatic cancer (CA19-9 stains were positive).  He is B12 deficient (B12 was 34 on 04/21/2014).  He is on B12 supplimentation.  He has a microcytic anemia consistent with iron deficiency.  He is status post 8 cycles of gemcitabine and Abraxane (05/04/2014 - 11/23/2014).  He developed a neuropathy with cycle #5 and thus Abraxane was decreased.  Because of ongoing symptomatic neuropathy, Abraxane was decreased with cycle #8.  He developed thrombocytopenia with cycle #6 and thus chemotherapy was switched to 2 weeks on/1 week off.  He last required a paracentesis on 07/31/2014.  CA19-9 has decreased from 1322 on 05/04/2014, 738 on 06/01/2014, 365 on 06/29/2014, 157 on 07/27/2014, 176 on 09/21/2014, 148 on 10/19/2014, and 220 on 11/23/2014.  Abdominal and pelvic CT scan on 07/24/2014 revealed slight interval decrease in the posterior left hepatic lesion and in the bulkiness of the omental disease.  The portal vein was attenuated, but patent.  There was increased ascites.  He underwent paracentesis on 07/31/2014.  5 liters were removed.   Abdominal and pelvic  CT scan on 10/26/2014 revealed reduced omental caking with the current indistinct tumor about 2.4 cm in thickness (previously 3.7 cm). He has ascites, a small right and trace   left pleural effusion (improved).  There was a stable lesion in segment 4B of the liver. There was a stable hypointense enhancing mass in the body and tail of the pancreas. 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  Abd/ pelvic CT on 01/01/2015 revealed interval progression of ascites with no substantial change in omental/ mesenteric disease. Hypo enhancing mass in body and tail of pancreas measuring smaller than previous. Interval development of short segment focal aortic dissection also noted, recommended continued follow up. No substantial change in heterogenous lesion in posterior left liver. Small right pleural effusion with overlying right base collapse/ consolidation.   He was diagnosed with right sided Bell's palsy on 10/09/2014.   Symptomatically, his neuropathy is stable (grade II). He denies any pain.  Abdomen is distended consistent with increased ascites on CT scan.  Plan: 1. Labs today: CBC with diff, CMP, Mg, CA19.9. 2. Continue current decreased dose of Abraxane. 3. CT scan as above with stable disease. Begin cycle #10 gemcitabine and Abraxane. 4. Continue monthly B12- due today.  Next due 12/21/2014. 5. RTC in 1 week for labs and day 8 gemcitabine and Abraxane. (Will be scheduled on Wednesday November 23rd due to holiday) 6. RTC in 3 weeks for MD assess, labs (CBC with diff, CMP, Mg, CA19-9), and cycle #11 gemcitabine/Abraxane.    Evlyn Kanner, NP  01/04/2015, 10:37 AM

## 2015-01-04 NOTE — Progress Notes (Signed)
Pt reports a changes in SOB when walking around getting more SOB.  Wife reports improvement in bells palsy can now control movements of mouth a whole lot better.

## 2015-01-05 LAB — CANCER ANTIGEN 19-9: CA 19-9: 465 U/mL — ABNORMAL HIGH (ref 0–35)

## 2015-01-08 ENCOUNTER — Other Ambulatory Visit: Payer: Self-pay | Admitting: Hematology and Oncology

## 2015-01-08 DIAGNOSIS — C252 Malignant neoplasm of tail of pancreas: Secondary | ICD-10-CM

## 2015-01-10 ENCOUNTER — Inpatient Hospital Stay: Payer: Medicare Other

## 2015-01-10 VITALS — BP 167/82 | HR 63 | Temp 97.3°F

## 2015-01-10 DIAGNOSIS — C252 Malignant neoplasm of tail of pancreas: Secondary | ICD-10-CM

## 2015-01-10 DIAGNOSIS — C801 Malignant (primary) neoplasm, unspecified: Secondary | ICD-10-CM

## 2015-01-10 MED ORDER — SODIUM CHLORIDE 0.9 % IV SOLN
Freq: Once | INTRAVENOUS | Status: AC
  Administered 2015-01-10: 10:00:00 via INTRAVENOUS
  Filled 2015-01-10: qty 4

## 2015-01-10 MED ORDER — PACLITAXEL PROTEIN-BOUND CHEMO INJECTION 100 MG
60.0000 mg/m2 | Freq: Once | INTRAVENOUS | Status: AC
  Administered 2015-01-10: 100 mg via INTRAVENOUS
  Filled 2015-01-10: qty 20

## 2015-01-10 MED ORDER — SODIUM CHLORIDE 0.9 % IV SOLN
Freq: Once | INTRAVENOUS | Status: AC
  Administered 2015-01-10: 10:00:00 via INTRAVENOUS
  Filled 2015-01-10: qty 1000

## 2015-01-10 MED ORDER — HEPARIN SOD (PORK) LOCK FLUSH 100 UNIT/ML IV SOLN
500.0000 [IU] | Freq: Once | INTRAVENOUS | Status: AC | PRN
  Administered 2015-01-10: 500 [IU]
  Filled 2015-01-10: qty 5

## 2015-01-10 MED ORDER — SODIUM CHLORIDE 0.9 % IJ SOLN
10.0000 mL | INTRAMUSCULAR | Status: AC | PRN
  Administered 2015-01-10: 10 mL
  Filled 2015-01-10: qty 10

## 2015-01-10 MED ORDER — GEMCITABINE HCL CHEMO INJECTION 1 GM/26.3ML
800.0000 mg/m2 | Freq: Once | INTRAVENOUS | Status: AC
  Administered 2015-01-10: 1368 mg via INTRAVENOUS
  Filled 2015-01-10: qty 30.72

## 2015-01-25 ENCOUNTER — Inpatient Hospital Stay (HOSPITAL_BASED_OUTPATIENT_CLINIC_OR_DEPARTMENT_OTHER): Payer: Medicare Other | Admitting: Hematology and Oncology

## 2015-01-25 ENCOUNTER — Other Ambulatory Visit: Payer: Self-pay | Admitting: *Deleted

## 2015-01-25 ENCOUNTER — Inpatient Hospital Stay: Payer: Medicare Other

## 2015-01-25 ENCOUNTER — Inpatient Hospital Stay: Payer: Medicare Other | Attending: Hematology and Oncology

## 2015-01-25 ENCOUNTER — Other Ambulatory Visit: Payer: Self-pay | Admitting: Hematology and Oncology

## 2015-01-25 VITALS — BP 159/93 | HR 65 | Temp 99.0°F | Resp 18 | Ht 67.0 in | Wt 141.5 lb

## 2015-01-25 DIAGNOSIS — E039 Hypothyroidism, unspecified: Secondary | ICD-10-CM | POA: Diagnosis not present

## 2015-01-25 DIAGNOSIS — R748 Abnormal levels of other serum enzymes: Secondary | ICD-10-CM | POA: Insufficient documentation

## 2015-01-25 DIAGNOSIS — E119 Type 2 diabetes mellitus without complications: Secondary | ICD-10-CM | POA: Diagnosis not present

## 2015-01-25 DIAGNOSIS — K769 Liver disease, unspecified: Secondary | ICD-10-CM | POA: Insufficient documentation

## 2015-01-25 DIAGNOSIS — D509 Iron deficiency anemia, unspecified: Secondary | ICD-10-CM | POA: Insufficient documentation

## 2015-01-25 DIAGNOSIS — Z794 Long term (current) use of insulin: Secondary | ICD-10-CM | POA: Insufficient documentation

## 2015-01-25 DIAGNOSIS — G620 Drug-induced polyneuropathy: Secondary | ICD-10-CM

## 2015-01-25 DIAGNOSIS — R634 Abnormal weight loss: Secondary | ICD-10-CM | POA: Insufficient documentation

## 2015-01-25 DIAGNOSIS — T451X5S Adverse effect of antineoplastic and immunosuppressive drugs, sequela: Secondary | ICD-10-CM | POA: Insufficient documentation

## 2015-01-25 DIAGNOSIS — C252 Malignant neoplasm of tail of pancreas: Secondary | ICD-10-CM | POA: Insufficient documentation

## 2015-01-25 DIAGNOSIS — G510 Bell's palsy: Secondary | ICD-10-CM | POA: Diagnosis not present

## 2015-01-25 DIAGNOSIS — Z5111 Encounter for antineoplastic chemotherapy: Secondary | ICD-10-CM | POA: Insufficient documentation

## 2015-01-25 DIAGNOSIS — J9811 Atelectasis: Secondary | ICD-10-CM | POA: Diagnosis not present

## 2015-01-25 DIAGNOSIS — R0602 Shortness of breath: Secondary | ICD-10-CM | POA: Diagnosis not present

## 2015-01-25 DIAGNOSIS — E538 Deficiency of other specified B group vitamins: Secondary | ICD-10-CM

## 2015-01-25 DIAGNOSIS — R5383 Other fatigue: Secondary | ICD-10-CM | POA: Diagnosis not present

## 2015-01-25 DIAGNOSIS — Z9181 History of falling: Secondary | ICD-10-CM

## 2015-01-25 DIAGNOSIS — R188 Other ascites: Secondary | ICD-10-CM | POA: Insufficient documentation

## 2015-01-25 DIAGNOSIS — I10 Essential (primary) hypertension: Secondary | ICD-10-CM

## 2015-01-25 DIAGNOSIS — J90 Pleural effusion, not elsewhere classified: Secondary | ICD-10-CM | POA: Diagnosis not present

## 2015-01-25 DIAGNOSIS — C801 Malignant (primary) neoplasm, unspecified: Secondary | ICD-10-CM

## 2015-01-25 DIAGNOSIS — Z7982 Long term (current) use of aspirin: Secondary | ICD-10-CM | POA: Diagnosis not present

## 2015-01-25 DIAGNOSIS — Z79899 Other long term (current) drug therapy: Secondary | ICD-10-CM | POA: Insufficient documentation

## 2015-01-25 DIAGNOSIS — E785 Hyperlipidemia, unspecified: Secondary | ICD-10-CM | POA: Diagnosis not present

## 2015-01-25 LAB — CBC WITH DIFFERENTIAL/PLATELET
Basophils Absolute: 0.1 10*3/uL (ref 0–0.1)
Basophils Relative: 1 %
EOS ABS: 0.1 10*3/uL (ref 0–0.7)
EOS PCT: 2 %
HCT: 27.9 % — ABNORMAL LOW (ref 40.0–52.0)
Hemoglobin: 9 g/dL — ABNORMAL LOW (ref 13.0–18.0)
LYMPHS ABS: 1 10*3/uL (ref 1.0–3.6)
Lymphocytes Relative: 19 %
MCH: 25.5 pg — AB (ref 26.0–34.0)
MCHC: 32.4 g/dL (ref 32.0–36.0)
MCV: 78.6 fL — ABNORMAL LOW (ref 80.0–100.0)
MONOS PCT: 12 %
Monocytes Absolute: 0.6 10*3/uL (ref 0.2–1.0)
Neutro Abs: 3.7 10*3/uL (ref 1.4–6.5)
Neutrophils Relative %: 66 %
PLATELETS: 374 10*3/uL (ref 150–440)
RBC: 3.55 MIL/uL — AB (ref 4.40–5.90)
RDW: 18.9 % — ABNORMAL HIGH (ref 11.5–14.5)
WBC: 5.5 10*3/uL (ref 3.8–10.6)

## 2015-01-25 LAB — COMPREHENSIVE METABOLIC PANEL
ALK PHOS: 82 U/L (ref 38–126)
ALT: 19 U/L (ref 17–63)
AST: 28 U/L (ref 15–41)
Albumin: 2.5 g/dL — ABNORMAL LOW (ref 3.5–5.0)
Anion gap: 6 (ref 5–15)
BUN: 12 mg/dL (ref 6–20)
CHLORIDE: 105 mmol/L (ref 101–111)
CO2: 25 mmol/L (ref 22–32)
CREATININE: 0.83 mg/dL (ref 0.61–1.24)
Calcium: 8.7 mg/dL — ABNORMAL LOW (ref 8.9–10.3)
GFR calc Af Amer: >60 mL/min (ref >60)
Glucose, Bld: 337 mg/dL — ABNORMAL HIGH (ref 65–99)
Potassium: 3.8 mmol/L (ref 3.5–5.1)
Sodium: 136 mmol/L (ref 135–145)
Total Bilirubin: 0.2 mg/dL — ABNORMAL LOW (ref 0.3–1.2)
Total Protein: 6.9 g/dL (ref 6.5–8.1)

## 2015-01-25 LAB — MAGNESIUM: Magnesium: 1.8 mg/dL (ref 1.7–2.4)

## 2015-01-25 LAB — PROTIME-INR
INR: 1.85
Prothrombin Time: 21.3 seconds — ABNORMAL HIGH (ref 11.4–15.0)

## 2015-01-25 MED ORDER — HEPARIN SOD (PORK) LOCK FLUSH 100 UNIT/ML IV SOLN
500.0000 [IU] | Freq: Once | INTRAVENOUS | Status: AC | PRN
  Administered 2015-01-25: 500 [IU]

## 2015-01-25 MED ORDER — KLOR-CON M20 20 MEQ PO TBCR: 20.0000 meq | EXTENDED_RELEASE_TABLET | Freq: Two times a day (BID) | ORAL | Status: DC

## 2015-01-25 MED ORDER — HEPARIN SOD (PORK) LOCK FLUSH 100 UNIT/ML IV SOLN
INTRAVENOUS | Status: AC
  Filled 2015-01-25: qty 5

## 2015-01-25 NOTE — Telephone Encounter (Signed)
OK to refill per Dr Mike Gip, E scribed, attempted to return call to [facility], but just kept ringing

## 2015-01-25 NOTE — Telephone Encounter (Signed)
I spoke with Harry Roman who said she needed rx sent to local pharmacy and also to mail order. E Scribed to both [facility] and [facility]

## 2015-01-25 NOTE — Addendum Note (Signed)
Addended by: Betti Cruz on: 01/25/2015 04:57 PM   Modules accepted: Orders

## 2015-01-25 NOTE — Progress Notes (Signed)
Patient is here for follow-up of pancreatic cancer and gemzar, abraxane treatment. Patient's wife states that patient fell Monday morning when he was getting out of the bathtub. He hurt his left knee, but states that it much better then when he first fell. His appetite has been great.

## 2015-01-25 NOTE — Telephone Encounter (Signed)
   Ref Range 8:43 AM (01/25/15) 3wk ago (01/04/15) 71mo ago (12/26/14) 109mo ago (12/23/14)     Sodium 135 - 145 mmol/L 136 133 (L) 135 132 (L)    Potassium 3.5 - 5.1 mmol/L 3.8 3.4 (L) 3.2 (L) 3.6

## 2015-01-26 ENCOUNTER — Ambulatory Visit
Admission: RE | Admit: 2015-01-26 | Discharge: 2015-01-26 | Disposition: A | Discharge status: Home / Self Care | Payer: Medicare Other | Source: Ambulatory Visit | Attending: Hematology and Oncology | Admitting: Hematology and Oncology

## 2015-01-26 DIAGNOSIS — R180 Malignant ascites: Secondary | ICD-10-CM | POA: Insufficient documentation

## 2015-01-26 DIAGNOSIS — C252 Malignant neoplasm of tail of pancreas: Secondary | ICD-10-CM

## 2015-01-26 LAB — CANCER ANTIGEN 19-9: CA 19-9: 980 U/mL — ABNORMAL HIGH (ref 0–35)

## 2015-01-26 NOTE — Procedures (Signed)
US guided paracentesis yielded 6.5 liters of fluid.  No immediate complication and minimal blood loss.

## 2015-01-29 ENCOUNTER — Other Ambulatory Visit: Payer: Self-pay

## 2015-01-29 ENCOUNTER — Ambulatory Visit
Admission: RE | Admit: 2015-01-29 | Discharge: 2015-01-29 | Disposition: A | Discharge status: Home / Self Care | Payer: Medicare Other | Source: Ambulatory Visit | Attending: Hematology and Oncology | Admitting: Hematology and Oncology

## 2015-01-29 ENCOUNTER — Telehealth: Payer: Self-pay | Admitting: Hematology and Oncology

## 2015-01-29 DIAGNOSIS — J90 Pleural effusion, not elsewhere classified: Secondary | ICD-10-CM | POA: Diagnosis not present

## 2015-01-29 DIAGNOSIS — C252 Malignant neoplasm of tail of pancreas: Secondary | ICD-10-CM

## 2015-01-29 DIAGNOSIS — R0602 Shortness of breath: Secondary | ICD-10-CM | POA: Insufficient documentation

## 2015-01-29 DIAGNOSIS — I313 Pericardial effusion (noninflammatory): Secondary | ICD-10-CM | POA: Diagnosis not present

## 2015-01-29 DIAGNOSIS — J9811 Atelectasis: Secondary | ICD-10-CM | POA: Insufficient documentation

## 2015-01-29 DIAGNOSIS — R188 Other ascites: Secondary | ICD-10-CM | POA: Diagnosis not present

## 2015-01-29 MED ORDER — KLOR-CON M20 20 MEQ PO TBCR: 20.0000 meq | EXTENDED_RELEASE_TABLET | Freq: Two times a day (BID) | ORAL | Status: DC

## 2015-01-29 MED ORDER — GABAPENTIN 100 MG PO CAPS: 200.0000 mg | ORAL_CAPSULE | Freq: Every day | ORAL | Status: DC

## 2015-01-29 MED ORDER — IOHEXOL 350 MG/ML SOLN
100.0000 mL | Freq: Once | INTRAVENOUS | Status: AC | PRN
  Administered 2015-01-29: 100 mL via INTRAVENOUS

## 2015-01-29 NOTE — Telephone Encounter (Signed)
Re:  CT angiogram  Discussed with patient and his wife results from his scan today.  No pulmonary embolism.  Small bilateral pleural effusions.  No metastatic disease in the chest.  Discussed his neuropathy.  Patient wishes to try Gabapentin.  Discussed starting slow at 200 mg at night.  Side effects reviewed.  Advance dose every 3-5 days.  Discussed patient's thoughts about Abraxane.  He is still considering.  We discussed his potassium supplimentation.  He has been taking 20 meq BID for at least 2 months.  He is only using Lasix prn.  We will continue to monitor his potassium closely.  Lequita Asal, MD

## 2015-02-01 ENCOUNTER — Encounter: Payer: Self-pay | Admitting: Hematology and Oncology

## 2015-02-01 ENCOUNTER — Other Ambulatory Visit: Payer: Self-pay | Admitting: Hematology and Oncology

## 2015-02-01 ENCOUNTER — Inpatient Hospital Stay (HOSPITAL_BASED_OUTPATIENT_CLINIC_OR_DEPARTMENT_OTHER): Payer: Medicare Other | Admitting: Hematology and Oncology

## 2015-02-01 ENCOUNTER — Ambulatory Visit: Payer: Medicare Other

## 2015-02-01 ENCOUNTER — Inpatient Hospital Stay: Payer: Medicare Other

## 2015-02-01 VITALS — BP 148/77 | HR 53 | Temp 98.5°F | Resp 18 | Ht 67.0 in | Wt 122.2 lb

## 2015-02-01 DIAGNOSIS — D509 Iron deficiency anemia, unspecified: Secondary | ICD-10-CM | POA: Diagnosis not present

## 2015-02-01 DIAGNOSIS — R188 Other ascites: Secondary | ICD-10-CM

## 2015-02-01 DIAGNOSIS — C252 Malignant neoplasm of tail of pancreas: Secondary | ICD-10-CM | POA: Diagnosis not present

## 2015-02-01 DIAGNOSIS — T451X5S Adverse effect of antineoplastic and immunosuppressive drugs, sequela: Secondary | ICD-10-CM

## 2015-02-01 DIAGNOSIS — G620 Drug-induced polyneuropathy: Secondary | ICD-10-CM

## 2015-02-01 DIAGNOSIS — R7989 Other specified abnormal findings of blood chemistry: Secondary | ICD-10-CM

## 2015-02-01 DIAGNOSIS — G629 Polyneuropathy, unspecified: Secondary | ICD-10-CM

## 2015-02-01 DIAGNOSIS — J9811 Atelectasis: Secondary | ICD-10-CM

## 2015-02-01 DIAGNOSIS — E538 Deficiency of other specified B group vitamins: Secondary | ICD-10-CM

## 2015-02-01 DIAGNOSIS — R5383 Other fatigue: Secondary | ICD-10-CM

## 2015-02-01 DIAGNOSIS — R945 Abnormal results of liver function studies: Secondary | ICD-10-CM

## 2015-02-01 DIAGNOSIS — K769 Liver disease, unspecified: Secondary | ICD-10-CM

## 2015-02-01 DIAGNOSIS — E119 Type 2 diabetes mellitus without complications: Secondary | ICD-10-CM

## 2015-02-01 DIAGNOSIS — C801 Malignant (primary) neoplasm, unspecified: Secondary | ICD-10-CM

## 2015-02-01 DIAGNOSIS — J90 Pleural effusion, not elsewhere classified: Secondary | ICD-10-CM

## 2015-02-01 DIAGNOSIS — I10 Essential (primary) hypertension: Secondary | ICD-10-CM

## 2015-02-01 LAB — CBC WITH DIFFERENTIAL/PLATELET
Basophils Absolute: 0.1 10*3/uL (ref 0–0.1)
Basophils Relative: 1 %
Eosinophils Absolute: 0.1 10*3/uL (ref 0–0.7)
Eosinophils Relative: 2 %
HCT: 28 % — ABNORMAL LOW (ref 40.0–52.0)
Hemoglobin: 9.2 g/dL — ABNORMAL LOW (ref 13.0–18.0)
Lymphocytes Relative: 20 %
Lymphs Abs: 1.2 10*3/uL (ref 1.0–3.6)
MCH: 25.3 pg — ABNORMAL LOW (ref 26.0–34.0)
MCHC: 32.7 g/dL (ref 32.0–36.0)
MCV: 77.2 fL — ABNORMAL LOW (ref 80.0–100.0)
Monocytes Absolute: 0.9 10*3/uL (ref 0.2–1.0)
Monocytes Relative: 15 %
Neutro Abs: 3.6 10*3/uL (ref 1.4–6.5)
Neutrophils Relative %: 62 %
Platelets: 364 10*3/uL (ref 150–440)
RBC: 3.63 MIL/uL — ABNORMAL LOW (ref 4.40–5.90)
RDW: 19.2 % — ABNORMAL HIGH (ref 11.5–14.5)
WBC: 5.8 10*3/uL (ref 3.8–10.6)

## 2015-02-01 LAB — COMPREHENSIVE METABOLIC PANEL
ALT: 111 U/L — ABNORMAL HIGH (ref 17–63)
AST: 121 U/L — ABNORMAL HIGH (ref 15–41)
Albumin: 2.5 g/dL — ABNORMAL LOW (ref 3.5–5.0)
Alkaline Phosphatase: 198 U/L — ABNORMAL HIGH (ref 38–126)
Anion gap: 7 (ref 5–15)
BUN: 9 mg/dL (ref 6–20)
CO2: 25 mmol/L (ref 22–32)
Calcium: 8.6 mg/dL — ABNORMAL LOW (ref 8.9–10.3)
Chloride: 103 mmol/L (ref 101–111)
Creatinine, Ser: 0.82 mg/dL (ref 0.61–1.24)
GFR calc Af Amer: >60 mL/min (ref >60)
GFR calc non Af Amer: >60 mL/min (ref >60)
Glucose, Bld: 330 mg/dL — ABNORMAL HIGH (ref 65–99)
Potassium: 3.6 mmol/L (ref 3.5–5.1)
Sodium: 135 mmol/L (ref 135–145)
Total Bilirubin: 0.3 mg/dL (ref 0.3–1.2)
Total Protein: 6.6 g/dL (ref 6.5–8.1)

## 2015-02-01 LAB — MAGNESIUM: Magnesium: 1.8 mg/dL (ref 1.7–2.4)

## 2015-02-01 MED ORDER — HEPARIN SOD (PORK) LOCK FLUSH 100 UNIT/ML IV SOLN
500.0000 [IU] | Freq: Once | INTRAVENOUS | Status: AC | PRN
  Administered 2015-02-01: 500 [IU]

## 2015-02-01 MED ORDER — HEPARIN SOD (PORK) LOCK FLUSH 100 UNIT/ML IV SOLN
500.0000 [IU] | Freq: Once | INTRAVENOUS | Status: DC
Start: 2015-02-01 — End: 2015-02-01
  Filled 2015-02-01: qty 5

## 2015-02-01 MED ORDER — SODIUM CHLORIDE 0.9 % IV SOLN
Freq: Once | INTRAVENOUS | Status: AC
  Administered 2015-02-01: 10:00:00 via INTRAVENOUS
  Filled 2015-02-01: qty 4

## 2015-02-01 MED ORDER — SODIUM CHLORIDE 0.9 % IV SOLN
800.0000 mg/m2 | Freq: Once | INTRAVENOUS | Status: AC
  Administered 2015-02-01: 1368 mg via INTRAVENOUS
  Filled 2015-02-01: qty 30.72

## 2015-02-01 MED ORDER — SODIUM CHLORIDE 0.9 % IV SOLN
Freq: Once | INTRAVENOUS | Status: AC
  Administered 2015-02-01: 10:00:00 via INTRAVENOUS
  Filled 2015-02-01: qty 1000

## 2015-02-01 MED ORDER — SODIUM CHLORIDE 0.9 % IJ SOLN
10.0000 mL | INTRAMUSCULAR | Status: DC | PRN
  Filled 2015-02-01: qty 10

## 2015-02-01 MED ORDER — CYANOCOBALAMIN 1000 MCG/ML IJ SOLN
1000.0000 ug | Freq: Once | INTRAMUSCULAR | Status: AC
  Administered 2015-02-01: 1000 ug via INTRAMUSCULAR
  Filled 2015-02-01: qty 1

## 2015-02-01 MED ORDER — SODIUM CHLORIDE 0.9 % IJ SOLN
10.0000 mL | INTRAMUSCULAR | Status: DC | PRN
  Administered 2015-02-01 (×2): 10 mL via INTRAVENOUS
  Filled 2015-02-01: qty 10

## 2015-02-01 NOTE — Progress Notes (Signed)
[facility] day:  02/01/2015   Chief Complaint: Harry Roman is a 69 y.o. male with metastatic pancreatic cancer who is seen for assessment prior to cycle #11 gemcitabine and Abraxane.  HPI: The patient was last seen in the medical oncology clinic on 01/25/2015.  At that time, abdominal and pelvic CT scan from 01/01/2015 was reviewed.  He was noted to have increased ascites, but no substantial change in the right omental/mesenteric disease.  The mass in the pancreas was smaller.  There was interval development of a short segment of focal aortic dissection at the level of the IMA extending 3 cm in the craniocaudal length.  The posterior liver lesion was stable.  Because of symptomatic ascites, chemotherapy was cancelled.  He underwent therapeutic paracentesis of 6.5 liters on 01/26/2015.  On 01/26/2015, he underwent chest CT angiogram for increased shortness of breath.  Imaging revealed no evidence of pulmonary embolism, small bilateral pleural effusions with bibasilar atelctasis, and a 2 cm density in the RLL representing round atelectasis.  During the interim, he has been breathing better.  Appetite has improved.  Neuropathy is the same.  He denies any pain.  He has been on Neurontin 200 mg a day since 01/29/2015.  He denies any other new medications.  He does not drink alcohol and denies any prior history of hepatitis.    Past Medical History  Diagnosis Date  . Hypertension   . Diabetes mellitus without complication ([place])   . Hyperlipidemia   . Ascites 2016  . Neuropathy ([place]) 09/30/2014  . Hypothyroidism   . Cancer ([place]) 2016    pancreas  . 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[place]) 05/22/2014   Past Surgical History  Procedure Laterality Date  . Hemorrhoid surgery    . Colonoscopy  Jan 2015    Dr Donnella Sham  . Peritoneal fluid aspiration  2016   Family History  Problem Relation Age of Onset  . Hypertension Mother   . Diabetes Mother    . Diabetes Sister   . Diabetes Brother     Social History:  reports that he has never smoked. He does not have any smokeless tobacco history on file. He reports that he does not drink alcohol or use illicit drugs.  The patient is accompanied by his wife.  Allergies: No Known Allergies  Current Medications: Current Outpatient Prescriptions  Medication Sig Dispense Refill  . aspirin 81 MG tablet Take 81 mg by mouth daily.    . carvedilol (COREG) 12.5 MG tablet Take 12.5 mg by mouth 2 (two) times daily with a meal.    . enalapril (VASOTEC) 20 MG tablet Take 30 mg by mouth daily.     Marland Kitchen gabapentin (NEURONTIN) 100 MG capsule Take 2 capsules (200 mg total) by mouth at bedtime. 60 capsule 0  . insulin regular (NOVOLIN R,HUMULIN R) 100 units/mL injection Sliding Scale  Glucose 200-300 take 2 units Glucose 301-400 take 4 units  Glucose 401-500 take 6 units 10 mL 1  . KLOR-CON M20 20 MEQ tablet Take 1 tablet (20 mEq total) by mouth 2 (two) times daily. 180 tablet 0  . levothyroxine (SYNTHROID, LEVOTHROID) 50 MCG tablet Take 50 mcg by mouth daily before breakfast.    . linagliptin (TRADJENTA) 5 MG TABS tablet Take 5 mg by mouth daily.    Marland Kitchen lovastatin (MEVACOR) 10 MG tablet Take 10 mg by mouth at bedtime.    Marland Kitchen oxyCODONE-acetaminophen (ROXICET) 5-325 MG tablet Take 1  tablet by mouth every 6 (six) hours as needed for severe pain. 10 tablet 0  . RELION ULTIMA TEST test strip      No current facility-administered medications for this visit.   Facility-Administered Medications Ordered in Other Visits  Medication Dose Route Frequency Provider Last Rate Last Dose  . sodium chloride 0.9 % 100 mL with potassium chloride 20 mEq, magnesium sulfate 2 g infusion   Intravenous Continuous Lequita Asal, MD   Stopped at 08/10/14 1655  . sodium chloride 0.9 % injection 10 mL  10 mL Intracatheter PRN Lequita Asal, MD   10 mL at 08/10/14 1410  . sodium chloride 0.9 % injection 10 mL  10 mL Intravenous  PRN Lequita Asal, MD   10 mL at 08/31/14 0927  . sodium chloride 0.9 % injection 10 mL  10 mL Intravenous PRN Lequita Asal, MD   10 mL at 09/28/14 1008  . sodium chloride 0.9 % injection 10 mL  10 mL Intracatheter PRN Lequita Asal, MD   10 mL at 01/10/15 2951   Review of Systems:  GENERAL: Feels better than last week.  No fevers or sweats.  Weight down 8 pounds (post paracentesis). PERFORMANCE STATUS (ECOG):  3 HEENT:  No visual changes, runny nose, sore throat, mouth sores or tenderness. Lungs: No shortness of breath or cough.  No hemoptysis. Cardiac:  No chest pain, palpitations, orthopnea, or PND. GI:  Appetite good.  No nausea, vomiting, diarrhea, constipation, melena or hematochezia. GU:  No urgency, frequency, dysuria, or hematuria. Musculoskeletal:  No back pain.  No joint pain.  No muscle tenderness. Extremities:  No lower extremity edema. Skin:  No rashes or skin changes. Neuro:  Bell's palsy.  Neuropathy (stable).  No headache, focal weakness, balance or coordination issues. Endocrine:  No diabetes, thyroid issues, hot flashes or night sweats. Psych:  No mood changes, depression or anxiety. Pain:  No pain. Review of systems:  All other systems reviewed and found to be negative.  Physical Exam: Blood pressure 148/77, pulse 53, temperature 98.5 F (36.9 C), temperature source Tympanic, resp. rate 18, height 5' 7"  (1.702 m), weight 122 lb 2.5 oz (55.41 kg).  GENERAL:  Thin elderly gentleman sitting in a wheelchair in the exam room in no acute distress. He needs assistance onto table. MENTAL STATUS:  Alert and oriented to person, place and time. HEAD:  Wearing a cap.  Alopecia.  Temporal wasting.  Right sided facial droop secondary to Bell's palsy.  Normocephalic, atraumatic, no Cushingoid features. EYES:  Brown eyes. Pupils equal round and reactive to light and accomodation.  No conjunctivitis or scleral icterus. ENT:  Oropharynx clear without lesion.  Dentures.   Tongue normal. Mucous membranes moist.  RESPIRATORY:  Decreased breath sounds right base.  Otherwise, clear to auscultation without rales, wheezes or rhonchi. CARDIOVASCULAR:  Regular rate and rhythm without murmur, rub or gallop. ABDOMEN:  No abdominal distention.  Soft, non-tender, with active bowel sounds, and no appreciable hepatosplenomegaly.   SKIN:  No rashes, ulcers or lesions. EXTREMITIES: No lower extremity edema.  No palpable cords. LYMPH NODES: No palpable cervical, supraclavicular, axillary or inguinal adenopathy  NEUROLOGICAL: Right sided Bell's palsy (stable). PSYCH:  Appropriate.  Infusion on 02/01/2015  Component Date Value Ref Range Status  . WBC 02/01/2015 5.8  3.8 - 10.6 K/uL Final   A-LINE DRAW  . RBC 02/01/2015 3.63* 4.40 - 5.90 MIL/uL Final  . Hemoglobin 02/01/2015 9.2* 13.0 - 18.0 g/dL Final  .  HCT 02/01/2015 28.0* 40.0 - 52.0 % Final  . MCV 02/01/2015 77.2* 80.0 - 100.0 fL Final  . MCH 02/01/2015 25.3* 26.0 - 34.0 pg Final  . MCHC 02/01/2015 32.7  32.0 - 36.0 g/dL Final  . RDW 02/01/2015 19.2* 11.5 - 14.5 % Final  . Platelets 02/01/2015 364  150 - 440 K/uL Final  . Neutrophils Relative % 02/01/2015 62   Final  . Neutro Abs 02/01/2015 3.6  1.4 - 6.5 K/uL Final  . Lymphocytes Relative 02/01/2015 20   Final  . Lymphs Abs 02/01/2015 1.2  1.0 - 3.6 K/uL Final  . Monocytes Relative 02/01/2015 15   Final  . Monocytes Absolute 02/01/2015 0.9  0.2 - 1.0 K/uL Final  . Eosinophils Relative 02/01/2015 2   Final  . Eosinophils Absolute 02/01/2015 0.1  0 - 0.7 K/uL Final  . Basophils Relative 02/01/2015 1   Final  . Basophils Absolute 02/01/2015 0.1  0 - 0.1 K/uL Final  . Sodium 02/01/2015 135  135 - 145 mmol/L Final  . Potassium 02/01/2015 3.6  3.5 - 5.1 mmol/L Final  . Chloride 02/01/2015 103  101 - 111 mmol/L Final  . CO2 02/01/2015 25  22 - 32 mmol/L Final  . Glucose, Bld 02/01/2015 330* 65 - 99 mg/dL Final  . BUN 02/01/2015 9  6 - 20 mg/dL Final  . Creatinine,  Ser 02/01/2015 0.82  0.61 - 1.24 mg/dL Final  . Calcium 02/01/2015 8.6* 8.9 - 10.3 mg/dL Final  . Total Protein 02/01/2015 6.6  6.5 - 8.1 g/dL Final  . Albumin 02/01/2015 2.5* 3.5 - 5.0 g/dL Final  . AST 02/01/2015 121* 15 - 41 U/L Final  . ALT 02/01/2015 111* 17 - 63 U/L Final  . Alkaline Phosphatase 02/01/2015 198* 38 - 126 U/L Final  . Total Bilirubin 02/01/2015 0.3  0.3 - 1.2 mg/dL Final  . GFR calc non Af Amer 02/01/2015 >60  >60 mL/min Final  . GFR calc Af Amer 02/01/2015 >60  >60 mL/min Final   Comment: (NOTE) The eGFR has been calculated using the CKD EPI equation. This calculation has not been validated in all clinical situations. eGFR's persistently <60 mL/min signify possible Chronic Kidney Disease.   . Anion gap 02/01/2015 7  5 - 15 Final  . Magnesium 02/01/2015 1.8  1.7 - 2.4 mg/dL Final    Assessment:  Harry Roman is a 69 y.o. African-American gentleman with metastatic pancreatic cancer.  He presented with unexplained weight loss 1 year, intermittent nausea vomiting 1 month, and a 1 week history of abdominal distention.  He lost 30-40 pounds in the past year.  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 Chest CT on 05/01/2014 revealed no evidence of metastatic disease.  Colonoscopy on 02/2013 was negative.  CA19-9 was 713 on 04/21/2014.  He underwent paracentesis x 4 (1.85 liters on 04/21/2014, 2.9 liters on 05/01/2014, 5 liters on 07/31/2014, and 6.5 liters on 01/26/2015).  Cytology was negative.  Omental biopsy on 04/21/2014 revealed metastatic adenocarcinoma consistent with pancreatic cancer (CA19-9 stains were positive).    He is B12 deficient (B12 was 34 on 04/21/2014).  He is on monthly B12  supplimentation (last 01/04/2015).  He has a microcytic anemia consistent with iron deficiency.  He  is status post 10 cycles of gemcitabine and Abraxane (05/04/2014 - 01/04/2015).  He developed a neuropathy with cycle #5 and thus Abraxane was decreased.  Because of ongoing symptomatic neuropathy, Abraxane was decreased with cycle #8.  He developed thrombocytopenia with cycle #6 and thus chemotherapy was switched to 2 weeks on/1 week off.  He last required a paracentesis on 01/26/2015.  CA19-9 initially decreased then increased:  1322 on 05/04/2014, 738 on 06/01/2014, 365 on 06/29/2014, 157 on 07/27/2014, 176 on 09/21/2014, 148 on 10/19/2014, and 220 on 11/23/2014, 465 on 01/04/2015, and 980 on 01/25/2015.  Abdominal and pelvic CT scan on 07/24/2014 revealed slight interval decrease in the posterior left hepatic lesion and in the bulkiness of the omental disease.  The portal vein was attenuated, but patent.  There was increased ascites.    Abdominal and pelvic CT scan on 10/26/2014 revealed reduced omental caking with the current indistinct tumor about 2.4 cm in thickness (previously 3.7 cm). He has ascites, a small right and trace left pleural effusion (improved).  There was a stable lesion in segment 4B of the liver. There was a stable hypointense enhancing mass in the body and tail of the pancreas. 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  Abdominal and pelvic CT scan from 01/01/2015 revealed increased ascites, but no substantial change in the right omental/mesenteric disease.  The mass in the pancreas was smaller.  There was interval development of a short segment of focal aortic dissection at the level of the IMA extending 3 cm in the craniocaudal length.  The posterior liver lesion was stable.  Chest CT angiogram on 01/26/2015 revealed no evidence of pulmonary embolism, small bilateral pleural effusions with bibasilar atelctasis, and a 2 cm density in the RLL  representing round atelectasis.  He was diagnosed with right sided Bell's palsy on 10/09/2014.  He has a grade II Abraxane neuropathy.  He was started on Neurontin on 01/29/2015.  Symptomatically, he is feeling much better since his paracentesis.  Liver enzymes have increased 4-5 times since last week.  He denies any new medications except for Neurontin (case reports of increased LFTs).  CA19-9 is increasing and worrisome for diffuse liver infiltration.    Plan: 1.  Labs today: CBC with diff, CMP, Mg. 2.  Add hepatitis B core antibody, hepatitis B surface antigen, hepatitis C testing. 3.  Hold Neurontin. 4.  Discuss potential etiologies of increased liver function tests.   5.  Gemcitabine alone today. 6.  B12 today. 7.  Encourage iron rich foods.  Encourage oral iron with vitamin C or OJ. 8.  RTC in 1 week for labs (CBC with diff, CMP), and gemcitabine+/- Abraxane.   Lequita Asal, MD  02/01/2015, 4:04 PM

## 2015-02-01 NOTE — Progress Notes (Signed)
[facility] day:  01/25/2015  Chief Complaint: Harry Roman is a 69 y.o. male with metastatic pancreatic cancer who is seen for assessment prior to cycle #11 gemcitabine and Abraxane.  HPI: The patient was last seen in the medical oncology clinic by me on 12/14/2014.  At that time, he felt pretty good.  His neuropathy was stable.   We discussed his neuropathy, decision was made to maintain his dose of Abraxane.  He began cycle #9 (2 weeks on/1 week off).  He underwent abdomen and pelvic CT scan on 01/01/2015.  Imaging revealed interval progression of ascites with no substantial change in the right omental/mesenteric disease.  The hypo enhancing mass in the body and tail of pancreas was smaller.  There was the interval development of a short segment focal aortic dissection at the level of the IMA origin and extending approximately 3 cm in craniocaudal length.  There was a stable small right pleural effusion with overlying right base collapse/consolidation.  There was no substantial change in the heterogeneous lesion identified in the posterior left liver.  He saw Georgeanne Nim, NP on 01/04/2015 for cycle #10.  Neuropathy was stable.  He received his chemotherapy uneventfully.  He was seen by vascular surgery.  Decision was made for observation and monitoring his blood pressure.  He has a follow-up visit on 02/12/2015.  During the interim, he slipped and fell out of the bathtub.  He hurt his knee.  His weight is up 11 pounds.  He notes some shortness of breath.  He needs help with activities of daily living (no change).  Past Medical History  Diagnosis Date  . Hypertension   . Diabetes mellitus without complication ([place])   . Hyperlipidemia   . Ascites 2016  . Neuropathy ([place]) 09/30/2014  . Hypothyroidism   . Cancer ([place]) 2016    pancreas  . 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[facility]) 05/22/2014   Past Surgical History  Procedure Laterality  Date  . Hemorrhoid surgery    . Colonoscopy  Jan 2015    Dr Donnella Sham  . Peritoneal fluid aspiration  2016   Family History  Problem Relation Age of Onset  . Hypertension Mother   . Diabetes Mother   . Diabetes Sister   . Diabetes Brother     Social History:  reports that he has never smoked. He does not have any smokeless tobacco history on file. He reports that he does not drink alcohol or use illicit drugs.  The patient is accompanied by his wife.  Allergies: No Known Allergies  Current Medications: Current Outpatient Prescriptions  Medication Sig Dispense Refill  . aspirin 81 MG tablet Take 81 mg by mouth daily.    . carvedilol (COREG) 12.5 MG tablet Take 12.5 mg by mouth 2 (two) times daily with a meal.    . enalapril (VASOTEC) 20 MG tablet Take 30 mg by mouth daily.     . insulin regular (NOVOLIN R,HUMULIN R) 100 units/mL injection Sliding Scale  Glucose 200-300 take 2 units Glucose 301-400 take 4 units  Glucose 401-500 take 6 units 10 mL 1  . levothyroxine (SYNTHROID, LEVOTHROID) 50 MCG tablet Take 50 mcg by mouth daily before breakfast.    . lovastatin (MEVACOR) 10 MG tablet Take 10 mg by mouth at bedtime.    Marland Kitchen oxyCODONE-acetaminophen (ROXICET) 5-325 MG tablet Take 1 tablet by mouth every 6 (six) hours as needed for severe pain. 10 tablet 0  .  RELION ULTIMA TEST test strip     . gabapentin (NEURONTIN) 100 MG capsule Take 2 capsules (200 mg total) by mouth at bedtime. 60 capsule 0  . KLOR-CON M20 20 MEQ tablet Take 1 tablet (20 mEq total) by mouth 2 (two) times daily. 180 tablet 0  . linagliptin (TRADJENTA) 5 MG TABS tablet Take 5 mg by mouth daily.     No current facility-administered medications for this visit.   Facility-Administered Medications Ordered in Other Visits  Medication Dose Route Frequency Provider Last Rate Last Dose  . sodium chloride 0.9 % 100 mL with potassium chloride 20 mEq, magnesium sulfate 2 g infusion   Intravenous Continuous Lequita Asal,  MD   Stopped at 08/10/14 1655  . sodium chloride 0.9 % injection 10 mL  10 mL Intracatheter PRN Lequita Asal, MD   10 mL at 08/10/14 1410  . sodium chloride 0.9 % injection 10 mL  10 mL Intravenous PRN Lequita Asal, MD   10 mL at 08/31/14 0927  . sodium chloride 0.9 % injection 10 mL  10 mL Intravenous PRN Lequita Asal, MD   10 mL at 09/28/14 1008  . sodium chloride 0.9 % injection 10 mL  10 mL Intracatheter PRN Lequita Asal, MD   10 mL at 01/10/15 3662   Review of Systems:  GENERAL: Fatigue.  No fevers or sweats.  Weight up 11 pounds. PERFORMANCE STATUS (ECOG):  3 HEENT:  No visual changes, runny nose, sore throat, mouth sores or tenderness. Lungs:  Shortness of breath (mild).  No cough.  No hemoptysis. Cardiac:  No chest pain, palpitations, orthopnea, or PND. GI:  No nausea, vomiting, diarrhea, constipation, melena or hematochezia. GU:  No urgency, frequency, dysuria, or hematuria. Musculoskeletal:  No back pain.  No joint pain.  No muscle tenderness. Extremities:  No lower extremity edema. Skin:  No rashes or skin changes. Neuro:  Bell's palsy.  Neuropathy.  Feet a little numb (stable).  No headache, focal weakness, balance or coordination issues. Endocrine:  Thyroid disease.  No diabetes, hot flashes or night sweats. Psych:  No mood changes, depression or anxiety. Pain:  No pain. Review of systems:  All other systems reviewed and found to be negative.  Physical Exam: Blood pressure 159/93, pulse 65, temperature 99 F (37.2 C), temperature source Tympanic, resp. rate 18, height 5' 7"  (1.702 m), weight 141 lb 8.6 oz (64.2 kg).  GENERAL:  Thin elderly gentleman sitting in a wheelchair in the exam room in no acute distress. He needs some assistance onto table. MENTAL STATUS:  Alert and oriented to person, place and time. HEAD:  Wearing a cap.  Alopecia.  Temporal wasting.  Right sided facial droop secondary to Bell's palsy.  Normocephalic, atraumatic, no Cushingoid  features. EYES:  Brown eyes.  Tears pooling in right lower lid secondary to Bell's palsy.  Pupils equal round and reactive to light and accomodation.  No conjunctivitis or scleral icterus. ENT:  Oropharynx clear without lesion.  Dentures.  Tongue normal. Mucous membranes moist.  RESPIRATORY:  Decreased breath sounds right base.  Left lower lobe dry crackles.  Otherwise, clear to auscultation without rales, wheezes or rhonchi. CARDIOVASCULAR:  Regular rate and rhythm without murmur, rub or gallop. ABDOMEN:  Tense ascites.  Soft, non-tender, with active bowel sounds, and no appreciable hepatosplenomegaly.   SKIN:  No rashes, ulcers or lesions. EXTREMITIES: No lower extremity edema.  No palpable cords. LYMPH NODES: No palpable cervical, supraclavicular, axillary or inguinal adenopathy  NEUROLOGICAL: Right sided Bell's palsy (stable). PSYCH:  Appropriate.  Infusion on 01/25/2015  Component Date Value Ref Range Status  . CA 19-9 01/25/2015 980* 0 - 35 U/mL Final   Comment: (NOTE) Roche ECLIA methodology Performed At: [facility] [place], [place] [id number] Lindon Romp MD [id number]   . Sodium 01/25/2015 136  135 - 145 mmol/L Final  . Potassium 01/25/2015 3.8  3.5 - 5.1 mmol/L Final  . Chloride 01/25/2015 105  101 - 111 mmol/L Final  . CO2 01/25/2015 25  22 - 32 mmol/L Final  . Glucose, Bld 01/25/2015 337* 65 - 99 mg/dL Final  . BUN 01/25/2015 12  6 - 20 mg/dL Final  . Creatinine, Ser 01/25/2015 0.83  0.61 - 1.24 mg/dL Final  . Calcium 01/25/2015 8.7* 8.9 - 10.3 mg/dL Final  . Total Protein 01/25/2015 6.9  6.5 - 8.1 g/dL Final  . Albumin 01/25/2015 2.5* 3.5 - 5.0 g/dL Final  . AST 01/25/2015 28  15 - 41 U/L Final  . ALT 01/25/2015 19  17 - 63 U/L Final  . Alkaline Phosphatase 01/25/2015 82  38 - 126 U/L Final  . Total Bilirubin 01/25/2015 0.2* 0.3 - 1.2 mg/dL Final  . GFR calc non Af Amer 01/25/2015 >60  >60 mL/min Final  . GFR calc Af Amer 01/25/2015 >60   >60 mL/min Final   Comment: (NOTE) The eGFR has been calculated using the CKD EPI equation. This calculation has not been validated in all clinical situations. eGFR's persistently <60 mL/min signify possible Chronic Kidney Disease.   . Anion gap 01/25/2015 6  5 - 15 Final  . WBC 01/25/2015 5.5  3.8 - 10.6 K/uL Final  . RBC 01/25/2015 3.55* 4.40 - 5.90 MIL/uL Final  . Hemoglobin 01/25/2015 9.0* 13.0 - 18.0 g/dL Final  . HCT 01/25/2015 27.9* 40.0 - 52.0 % Final  . MCV 01/25/2015 78.6* 80.0 - 100.0 fL Final  . MCH 01/25/2015 25.5* 26.0 - 34.0 pg Final  . MCHC 01/25/2015 32.4  32.0 - 36.0 g/dL Final  . RDW 01/25/2015 18.9* 11.5 - 14.5 % Final  . Platelets 01/25/2015 374  150 - 440 K/uL Final  . Neutrophils Relative % 01/25/2015 66   Final  . Neutro Abs 01/25/2015 3.7  1.4 - 6.5 K/uL Final  . Lymphocytes Relative 01/25/2015 19   Final  . Lymphs Abs 01/25/2015 1.0  1.0 - 3.6 K/uL Final  . Monocytes Relative 01/25/2015 12   Final  . Monocytes Absolute 01/25/2015 0.6  0.2 - 1.0 K/uL Final  . Eosinophils Relative 01/25/2015 2   Final  . Eosinophils Absolute 01/25/2015 0.1  0 - 0.7 K/uL Final  . Basophils Relative 01/25/2015 1   Final  . Basophils Absolute 01/25/2015 0.1  0 - 0.1 K/uL Final  . Magnesium 01/25/2015 1.8  1.7 - 2.4 mg/dL Final  Appointment on 01/25/2015  Component Date Value Ref Range Status  . Prothrombin Time 01/25/2015 21.3* 11.4 - 15.0 seconds Final  . INR 01/25/2015 1.85   Final    Assessment:  Harry Roman is a 69 y.o. African-American gentleman with metastatic pancreatic cancer.  He presented with unexplained weight loss 1 year, intermittent nausea vomiting 1 month, and a 1 week history of abdominal distention.  He lost 30-40 pounds in the past year.  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 Chest CT on 05/01/2014 revealed no evidence of metastatic disease.  Colonoscopy on 02/2013 was negative.  CA19-9 was 713 on 04/21/2014.  He underwent paracentesis x 3 (1.85 liters on 04/21/2014, 2.9 liters on 05/01/2014, and 5 liters on 07/31/2014).  Cytology was negative.  Omental biopsy on 04/21/2014 revealed metastatic adenocarcinoma consistent with pancreatic cancer (CA19-9 stains were positive).    He is B12 deficient (B12 was 34 on 04/21/2014).  He is on monthly B12 supplimentation.  He has a microcytic anemia consistent with iron deficiency.  He is status post 9 cycles of gemcitabine and Abraxane (05/04/2014 - 11/23/2014).  He developed a neuropathy with cycle #5 and thus Abraxane was decreased.  Because of ongoing symptomatic neuropathy, Abraxane was decreased with cycle #8.  He developed thrombocytopenia with cycle #6 and thus chemotherapy was switched to 2 weeks on/1 week off.  He last required a paracentesis on 07/31/2014.  CA19-9 initially decreased then increased from 1322 on 05/04/2014, 738 on 06/01/2014, 365 on 06/29/2014, 157 on 07/27/2014, 176 on 09/21/2014, 148 on 10/19/2014, 220 on 11/23/2014, and 465 on 01/04/2015.  Abdominal and pelvic CT scan on 07/24/2014 revealed slight interval decrease in the posterior left hepatic lesion and in the bulkiness of the omental disease.  The portal vein was attenuated, but patent.  There was increased ascites.    Abdominal and pelvic CT scan on 10/26/2014 revealed reduced omental caking with the current indistinct tumor about 2.4 cm in thickness (previously 3.7 cm). He has ascites, a small right and trace left pleural effusion (improved).  There was a stable lesion in segment 4B of the liver. There was a stable hypointense enhancing mass in the body and tail of the pancreas. 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  Abdomen and pelvic CT scan on 01/01/2015 revealed interval progression of ascites with no substantial change in the right omental/mesenteric disease.  The mass in the body and tail of pancreas was smaller.  There was the interval development of a short segment focal aortic dissection at the level of the IMA origin (seen by vascular; observation).  There was a stable small right pleural effusion with overlying right base collapse/consolidation.  There was no substantial change in the posterior left liver lobe.  He was diagnosed with right sided Bell's palsy on 10/09/2014.   Symptomatically, is fatigued.  He has tense ascites and associated shortness of breath.  His neuropathy is stable (grade II).  Weight is up 11 pounds (fluid).  He denies any pain.   Plan: 1. Review abdomen/pelvic CT scan.  Discuss plan for therapeutic paracentesis. 2. Labs today: CBC with diff, CMP, Mg, CA19.9, PT/INR 3. Postpone chemotherapy today. 4. Therapeutic paracentesis. 5. Chest CT angiogram:  shortness of breath; r/o PE, assess metastatic disease. 6. Continue monthly B12-next due 02/01/2015. 7. RTC in 1 week for MD assess, labs (CBC with diff, CMP, Mg), review of imaging studies, and cycle #11 gemcitabine/Abraxane.   Lequita Asal, MD  01/25/2015, 3:28 PM

## 2015-02-02 LAB — HEPATITIS B CORE ANTIBODY, TOTAL: Hep B Core Total Ab: NEGATIVE

## 2015-02-02 LAB — HEPATITIS C ANTIBODY: HCV Ab: <0.1 s/co ratio (ref 0.0–0.9)

## 2015-02-02 LAB — HEPATITIS B SURFACE ANTIGEN: Hepatitis B Surface Ag: NEGATIVE

## 2015-02-06 ENCOUNTER — Other Ambulatory Visit: Payer: Self-pay | Admitting: Hematology and Oncology

## 2015-02-08 ENCOUNTER — Other Ambulatory Visit: Payer: Self-pay

## 2015-02-08 ENCOUNTER — Inpatient Hospital Stay: Payer: Medicare Other

## 2015-02-08 ENCOUNTER — Encounter: Payer: Self-pay | Admitting: Hematology and Oncology

## 2015-02-08 ENCOUNTER — Inpatient Hospital Stay (HOSPITAL_BASED_OUTPATIENT_CLINIC_OR_DEPARTMENT_OTHER): Payer: Medicare Other | Admitting: Hematology and Oncology

## 2015-02-08 VITALS — BP 164/88 | HR 64 | Temp 98.8°F | Resp 18 | Ht 67.0 in | Wt 121.7 lb

## 2015-02-08 DIAGNOSIS — C252 Malignant neoplasm of tail of pancreas: Secondary | ICD-10-CM

## 2015-02-08 DIAGNOSIS — D509 Iron deficiency anemia, unspecified: Secondary | ICD-10-CM | POA: Diagnosis not present

## 2015-02-08 DIAGNOSIS — T451X5S Adverse effect of antineoplastic and immunosuppressive drugs, sequela: Secondary | ICD-10-CM

## 2015-02-08 DIAGNOSIS — R748 Abnormal levels of other serum enzymes: Secondary | ICD-10-CM | POA: Diagnosis not present

## 2015-02-08 DIAGNOSIS — J90 Pleural effusion, not elsewhere classified: Secondary | ICD-10-CM

## 2015-02-08 DIAGNOSIS — G510 Bell's palsy: Secondary | ICD-10-CM

## 2015-02-08 DIAGNOSIS — G620 Drug-induced polyneuropathy: Secondary | ICD-10-CM

## 2015-02-08 DIAGNOSIS — R0602 Shortness of breath: Secondary | ICD-10-CM

## 2015-02-08 DIAGNOSIS — C801 Malignant (primary) neoplasm, unspecified: Secondary | ICD-10-CM

## 2015-02-08 DIAGNOSIS — R634 Abnormal weight loss: Secondary | ICD-10-CM

## 2015-02-08 DIAGNOSIS — J9811 Atelectasis: Secondary | ICD-10-CM

## 2015-02-08 DIAGNOSIS — E538 Deficiency of other specified B group vitamins: Secondary | ICD-10-CM

## 2015-02-08 DIAGNOSIS — R5383 Other fatigue: Secondary | ICD-10-CM

## 2015-02-08 LAB — COMPREHENSIVE METABOLIC PANEL
ALT: 128 U/L — ABNORMAL HIGH (ref 17–63)
AST: 114 U/L — ABNORMAL HIGH (ref 15–41)
Albumin: 2.3 g/dL — ABNORMAL LOW (ref 3.5–5.0)
Alkaline Phosphatase: 356 U/L — ABNORMAL HIGH (ref 38–126)
Anion gap: 6 (ref 5–15)
BUN: 11 mg/dL (ref 6–20)
CO2: 25 mmol/L (ref 22–32)
Calcium: 8.7 mg/dL — ABNORMAL LOW (ref 8.9–10.3)
Chloride: 102 mmol/L (ref 101–111)
Creatinine, Ser: 0.75 mg/dL (ref 0.61–1.24)
GFR calc Af Amer: >60 mL/min (ref >60)
GFR calc non Af Amer: >60 mL/min (ref >60)
Glucose, Bld: 363 mg/dL — ABNORMAL HIGH (ref 65–99)
Potassium: 3.5 mmol/L (ref 3.5–5.1)
Sodium: 133 mmol/L — ABNORMAL LOW (ref 135–145)
Total Bilirubin: 0.9 mg/dL (ref 0.3–1.2)
Total Protein: 6.9 g/dL (ref 6.5–8.1)

## 2015-02-08 LAB — CBC WITH DIFFERENTIAL/PLATELET
Basophils Absolute: 0.1 10*3/uL (ref 0–0.1)
Basophils Relative: 1 %
Eosinophils Absolute: 0.1 10*3/uL (ref 0–0.7)
Eosinophils Relative: 2 %
HCT: 28.2 % — ABNORMAL LOW (ref 40.0–52.0)
Hemoglobin: 9 g/dL — ABNORMAL LOW (ref 13.0–18.0)
Lymphocytes Relative: 20 %
Lymphs Abs: 1.2 10*3/uL (ref 1.0–3.6)
MCH: 24.9 pg — ABNORMAL LOW (ref 26.0–34.0)
MCHC: 32 g/dL (ref 32.0–36.0)
MCV: 77.7 fL — ABNORMAL LOW (ref 80.0–100.0)
Monocytes Absolute: 0.9 10*3/uL (ref 0.2–1.0)
Monocytes Relative: 16 %
Neutro Abs: 3.5 10*3/uL (ref 1.4–6.5)
Neutrophils Relative %: 61 %
Platelets: 193 10*3/uL (ref 150–440)
RBC: 3.63 MIL/uL — ABNORMAL LOW (ref 4.40–5.90)
RDW: 19.7 % — ABNORMAL HIGH (ref 11.5–14.5)
WBC: 5.8 10*3/uL (ref 3.8–10.6)

## 2015-02-08 MED ORDER — SODIUM CHLORIDE 0.9 % IJ SOLN
10.0000 mL | Freq: Once | INTRAMUSCULAR | Status: AC
  Administered 2015-02-08: 10 mL via INTRAVENOUS
  Filled 2015-02-08: qty 10

## 2015-02-08 MED ORDER — HEPARIN SOD (PORK) LOCK FLUSH 100 UNIT/ML IV SOLN
500.0000 [IU] | Freq: Once | INTRAVENOUS | Status: AC | PRN
  Administered 2015-02-08: 500 [IU]

## 2015-02-08 MED ORDER — HEPARIN SOD (PORK) LOCK FLUSH 100 UNIT/ML IV SOLN
INTRAVENOUS | Status: AC
  Filled 2015-02-08: qty 5

## 2015-02-08 NOTE — Progress Notes (Signed)
[facility] day:  02/08/2015   Chief Complaint: Harry Roman is a 69 y.o. male with metastatic pancreatic cancer who is seen for assessment prior to day 8 of cycle #1 gemcitabine.  HPI: The patient was last seen in the medical oncology clinic on 02/01/2015.  At that time, he was seen for assessment prior to cycle #11 gemcitabine and Abraxane.   Symptomatically, he  felt much better since his paracentesis.  Liver enzymes had increased 4-5 times from the prior week.  He denies any new medications except for Neurontin (case reports of increased LFTs).  CA19-9 was increasing and worrisome for diffuse liver infiltration.  Neurontin was held.  Hepatitis B and C serologies were sent and returned negative.  Decision was made to proceed with gemcitabine alone.  He states that he tolerated his gemcitabine without problems.  He notes that the chemotherapy lasted a shorter time.  Symptomatically, he denies any nausea, vomiting or abdominal pain.  He is taking oral iron.  He is using Miralax and Senna for constipation.  He is taking potassium twice a day.  He has not needed Lasix.  He is "not eating much".  Past Medical History  Diagnosis Date  . Hypertension   . Diabetes mellitus without complication ([place])   . Hyperlipidemia   . Ascites 2016  . Neuropathy ([place]) 09/30/2014  . Hypothyroidism   . Cancer ([place]) 2016    pancreas  . 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 Hemorrhoid surgery    . Colonoscopy  Jan 2015    Dr Donnella Sham  . Peritoneal fluid aspiration  2016   Family History  Problem Relation Age of Onset  . Hypertension Mother   . Diabetes Mother   . Diabetes Sister   . Diabetes Brother     Social History:  reports that he has never smoked. He does not have any smokeless tobacco history on file. He reports that he does not drink alcohol or use illicit drugs.  The patient is  accompanied by his wife.  Allergies: No Known Allergies  Current Medications: Current Outpatient Prescriptions  Medication Sig Dispense Refill  . aspirin 81 MG tablet Take 81 mg by mouth daily.    . carvedilol (COREG) 12.5 MG tablet Take 12.5 mg by mouth 2 (two) times daily with a meal.    . enalapril (VASOTEC) 20 MG tablet Take 30 mg by mouth daily.     . ferrous sulfate 325 (65 FE) MG tablet Take 325 mg by mouth daily with breakfast.    . gabapentin (NEURONTIN) 100 MG capsule Take 2 capsules (200 mg total) by mouth at bedtime. 60 capsule 0  . insulin regular (NOVOLIN R,HUMULIN R) 100 units/mL injection Sliding Scale  Glucose 200-300 take 2 units Glucose 301-400 take 4 units  Glucose 401-500 take 6 units 10 mL 1  . KLOR-CON M20 20 MEQ tablet Take 1 tablet (20 mEq total) by mouth 2 (two) times daily. 180 tablet 0  . levothyroxine (SYNTHROID, LEVOTHROID) 50 MCG tablet Take 50 mcg by mouth daily before breakfast.    . linagliptin (TRADJENTA) 5 MG TABS tablet Take 5 mg by mouth daily.    Marland Kitchen lovastatin (MEVACOR) 10 MG tablet Take 10 mg by mouth at bedtime.    Marland Kitchen oxyCODONE-acetaminophen (ROXICET) 5-325 MG tablet Take 1 tablet by mouth every 6 (six) hours as needed for severe pain. 10 tablet  0  . RELION ULTIMA TEST test strip      No current facility-administered medications for this visit.   Facility-Administered Medications Ordered in Other Visits  Medication Dose Route Frequency Provider Last Rate Last Dose  . sodium chloride 0.9 % 100 mL with potassium chloride 20 mEq, magnesium sulfate 2 g infusion   Intravenous Continuous Lequita Asal, MD   Stopped at 08/10/14 1655  . sodium chloride 0.9 % injection 10 mL  10 mL Intracatheter PRN Lequita Asal, MD   10 mL at 08/10/14 1410  . sodium chloride 0.9 % injection 10 mL  10 mL Intravenous PRN Lequita Asal, MD   10 mL at 08/31/14 0927  . sodium chloride 0.9 % injection 10 mL  10 mL Intravenous PRN Lequita Asal, MD   10 mL  at 09/28/14 1008  . sodium chloride 0.9 % injection 10 mL  10 mL Intracatheter PRN Lequita Asal, MD   10 mL at 01/10/15 3825   Review of Systems:  GENERAL: Feels about the same.  No fevers or sweats.  Weight down 1 pound. PERFORMANCE STATUS (ECOG):  3 HEENT:  No visual changes, runny nose, sore throat, mouth sores or tenderness. Lungs:  Little shortness of breath.  No cough.  No hemoptysis. Cardiac:  No chest pain, palpitations, orthopnea, or PND. GI:  Appetite poor.  No nausea, vomiting, diarrhea, constipation, melena or hematochezia. GU:  No urgency, frequency, dysuria, or hematuria. Musculoskeletal:  No back pain.  No joint pain.  No muscle tenderness. Extremities:  No lower extremity edema. Skin:  No rashes or skin changes. Neuro:  Bell's palsy, improving.  Neuropathy (stable off Neurontin).  No headache, focal weakness, balance or coordination issues. Endocrine:  Diabetes.  Thyroid disease on levothyroxine.  No hot flashes or night sweats. Psych:  No mood changes, depression or anxiety. Pain:  No pain. Review of systems:  All other systems reviewed and found to be negative.  Physical Exam: Blood pressure 164/88, pulse 64, temperature 98.8 F (37.1 C), temperature source Tympanic, resp. rate 18, height 5' 7"  (1.702 m), weight 121 lb 11.1 oz (55.2 kg).  GENERAL:  Thin chronically fatigued appearing elderly gentleman sitting in a wheelchair in the exam room in no acute distress. MENTAL STATUS:  Alert and oriented to person, place and time. HEAD:  Wearing a cap.  Alopecia.  Temporal wasting.  Right sided facial droop secondary to Bell's palsy, improved.  Normocephalic, atraumatic, no Cushingoid features. EYES:  Brown eyes. Pupils equal round and reactive to light and accomodation.  No conjunctivitis or scleral icterus. ENT:  Oropharynx clear without lesion.  Dentures.  Tongue normal. Mucous membranes moist.  RESPIRATORY: Poor respiratory excursion.  Clear to auscultation without  rales, wheezes or rhonchi. CARDIOVASCULAR:  Regular rate and rhythm without murmur, rub or gallop. ABDOMEN:  Mild abdominal distention.  Soft, non-tender, with active bowel sounds, and no appreciable hepatosplenomegaly.   SKIN:  No rashes, ulcers or lesions. EXTREMITIES: No lower extremity edema.  No palpable cords. LYMPH NODES: No palpable cervical, supraclavicular, axillary or inguinal adenopathy  NEUROLOGICAL: Right sided Bell's palsy (improved). PSYCH:  Appropriate.  Appointment on 02/08/2015  Component Date Value Ref Range Status  . WBC 02/08/2015 5.8  3.8 - 10.6 K/uL Final  . RBC 02/08/2015 3.63* 4.40 - 5.90 MIL/uL Final  . Hemoglobin 02/08/2015 9.0* 13.0 - 18.0 g/dL Final  . HCT 02/08/2015 28.2* 40.0 - 52.0 % Final  . MCV 02/08/2015 77.7* 80.0 - 100.0 fL  Final  . [place] 02/08/2015 24.9* 26.0 - 34.0 pg Final  . MCHC 02/08/2015 32.0  32.0 - 36.0 g/dL Final  . RDW 02/08/2015 19.7* 11.5 - 14.5 % Final  . Platelets 02/08/2015 193  150 - 440 K/uL Final  . Neutrophils Relative % 02/08/2015 61   Final  . Neutro Abs 02/08/2015 3.5  1.4 - 6.5 K/uL Final  . Lymphocytes Relative 02/08/2015 20   Final  . Lymphs Abs 02/08/2015 1.2  1.0 - 3.6 K/uL Final  . Monocytes Relative 02/08/2015 16   Final  . Monocytes Absolute 02/08/2015 0.9  0.2 - 1.0 K/uL Final  . Eosinophils Relative 02/08/2015 2   Final  . Eosinophils Absolute 02/08/2015 0.1  0 - 0.7 K/uL Final  . Basophils Relative 02/08/2015 1   Final  . Basophils Absolute 02/08/2015 0.1  0 - 0.1 K/uL Final  . Sodium 02/08/2015 133* 135 - 145 mmol/L Final  . Potassium 02/08/2015 3.5  3.5 - 5.1 mmol/L Final  . Chloride 02/08/2015 102  101 - 111 mmol/L Final  . CO2 02/08/2015 25  22 - 32 mmol/L Final  . Glucose, Bld 02/08/2015 363* 65 - 99 mg/dL Final  . BUN 02/08/2015 11  6 - 20 mg/dL Final  . Creatinine, Ser 02/08/2015 0.75  0.61 - 1.24 mg/dL Final  . Calcium 02/08/2015 8.7* 8.9 - 10.3 mg/dL Final  . Total Protein 02/08/2015 6.9  6.5 - 8.1  g/dL Final  . Albumin 02/08/2015 2.3* 3.5 - 5.0 g/dL Final  . AST 02/08/2015 114* 15 - 41 U/L Final  . ALT 02/08/2015 128* 17 - 63 U/L Final  . Alkaline Phosphatase 02/08/2015 356* 38 - 126 U/L Final  . Total Bilirubin 02/08/2015 0.9  0.3 - 1.2 mg/dL Final  . GFR calc non Af Amer 02/08/2015 >60  >60 mL/min Final  . GFR calc Af Amer 02/08/2015 >60  >60 mL/min Final   Comment: (NOTE) The eGFR has been calculated using the CKD EPI equation. This calculation has not been validated in all clinical situations. eGFR's persistently <60 mL/min signify possible Chronic Kidney Disease.   . Anion gap 02/08/2015 6  5 - 15 Final    Assessment:  Harry Roman is a 69 y.o. African-American gentleman with metastatic pancreatic cancer.  He presented with unexplained weight loss 1 year, intermittent nausea vomiting 1 month, and a 1 week history of abdominal distention.  He lost 30-40 pounds in the past year.  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 Chest CT on 05/01/2014 revealed no evidence of metastatic disease.  Colonoscopy on 02/2013 was negative.  CA19-9 was 713 on 04/21/2014.  He underwent paracentesis x 4 (1.85 liters on 04/21/2014, 2.9 liters on 05/01/2014, 5 liters on 07/31/2014, and 6.5 liters on 01/26/2015).  Cytology was negative.  Omental biopsy on 04/21/2014 revealed metastatic adenocarcinoma consistent with pancreatic cancer (CA19-9 stains were positive).    He is B12 deficient (B12 was 34 on 04/21/2014).  He is on monthly B12 supplimentation (last 01/04/2015).  He has a microcytic anemia consistent with iron deficiency.  He is status post 10 cycles of gemcitabine and Abraxane (05/04/2014 - 01/04/2015).  He developed a  neuropathy with cycle #5 and thus Abraxane was decreased.  Because of ongoing  symptomatic neuropathy, Abraxane was decreased with cycle #8.  He developed thrombocytopenia with cycle #6 and thus chemotherapy was switched to 2 weeks on/1 week off.  He last required a paracentesis on 01/26/2015.  CA19-9 initially decreased then increased:  1322 on 05/04/2014, 738 on 06/01/2014, 365 on 06/29/2014, 157 on 07/27/2014, 176 on 09/21/2014, 148 on 10/19/2014, and 220 on 11/23/2014, 465 on 01/04/2015, and 980 on 01/25/2015.  Abdominal and pelvic CT scan on 07/24/2014 revealed slight interval decrease in the posterior left hepatic lesion and in the bulkiness of the omental disease.  The portal vein was attenuated, but patent.  There was increased ascites.    Abdominal and pelvic CT scan on 10/26/2014 revealed reduced omental caking with the current indistinct tumor about 2.4 cm in thickness (previously 3.7 cm). He has ascites, a small right and trace left pleural effusion (improved).  There was a stable lesion in segment 4B of the liver. There was a stable hypointense enhancing mass in the body and tail of the pancreas. 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  Abdominal and pelvic CT scan from 01/01/2015 revealed increased ascites, but no substantial change in the right omental/mesenteric disease.  The mass in the pancreas was smaller.  There was interval development of a short segment of focal aortic dissection at the level of the IMA extending 3 cm in the craniocaudal length.  The posterior liver lesion was stable.  Chest CT angiogram on 01/26/2015 revealed no evidence of pulmonary embolism, small bilateral pleural effusions with bibasilar atelctasis, and a 2 cm density in the RLL representing round atelectasis.  He received gemcitabine alone on 02/01/2015.  Liver enzymes are elevated (stable).  Alkaline phosphatase has increased room 82 (normal) on 01/25/2015 to 356 today.   Bilirubin is normal (0.9), but increasing (prior 0.3).  Hepatitis B and C testing was negative on 02/01/2015.  CA19-9 is increasing and worrisome for diffuse liver infiltration.   He was diagnosed with right sided Bell's palsy on 10/09/2014.  He has a grade II Abraxane neuropathy.   Symptomatically, he denies any nausea, vomiting or RUQ pain.  Energy level is poor.  Code status is DNR/DNI.   Plan: 1.  Labs today: CBC with diff, CMP, CA19-9. 2.  Discuss lab trend and likely progressive pancreatic cancer. 3.  Discuss no chemotherapy today.  Patient in agreement. 4.  Schedule RUQ ultrasound- r/o biliary obstruction. 5.  Discuss code status issues.  Discuss no heroic measures (DNR/DNI). 6.  Discuss patient's scheduled vascular appointment tomorrow. 7.  RTC after ultrasound.   Lequita Asal, MD  02/08/2015, 9:33 AM

## 2015-02-09 LAB — CANCER ANTIGEN 19-9: CA 19-9: 1695 U/mL — ABNORMAL HIGH (ref 0–35)

## 2015-02-14 ENCOUNTER — Other Ambulatory Visit: Payer: Self-pay | Admitting: Hematology and Oncology

## 2015-02-14 DIAGNOSIS — R748 Abnormal levels of other serum enzymes: Secondary | ICD-10-CM

## 2015-02-14 DIAGNOSIS — C252 Malignant neoplasm of tail of pancreas: Secondary | ICD-10-CM

## 2015-02-15 ENCOUNTER — Ambulatory Visit
Admission: RE | Admit: 2015-02-15 | Discharge: 2015-02-15 | Disposition: A | Discharge status: Home / Self Care | Payer: Medicare Other | Source: Ambulatory Visit | Attending: Hematology and Oncology | Admitting: Hematology and Oncology

## 2015-02-15 DIAGNOSIS — K869 Disease of pancreas, unspecified: Secondary | ICD-10-CM | POA: Insufficient documentation

## 2015-02-15 DIAGNOSIS — K769 Liver disease, unspecified: Secondary | ICD-10-CM | POA: Diagnosis not present

## 2015-02-15 DIAGNOSIS — K8020 Calculus of gallbladder without cholecystitis without obstruction: Secondary | ICD-10-CM | POA: Insufficient documentation

## 2015-02-15 DIAGNOSIS — C252 Malignant neoplasm of tail of pancreas: Secondary | ICD-10-CM

## 2015-02-15 DIAGNOSIS — R748 Abnormal levels of other serum enzymes: Secondary | ICD-10-CM

## 2015-02-20 ENCOUNTER — Telehealth: Payer: Self-pay | Admitting: Hematology and Oncology

## 2015-02-20 ENCOUNTER — Inpatient Hospital Stay: Payer: Medicare Other

## 2015-02-20 ENCOUNTER — Inpatient Hospital Stay: Payer: Medicare Other | Attending: Hematology and Oncology | Admitting: Hematology and Oncology

## 2015-02-20 ENCOUNTER — Telehealth: Payer: Self-pay | Admitting: *Deleted

## 2015-02-20 VITALS — BP 164/97 | HR 64 | Temp 99.0°F | Resp 18 | Ht 67.0 in | Wt 127.9 lb

## 2015-02-20 DIAGNOSIS — Z66 Do not resuscitate: Secondary | ICD-10-CM | POA: Diagnosis not present

## 2015-02-20 DIAGNOSIS — E538 Deficiency of other specified B group vitamins: Secondary | ICD-10-CM | POA: Insufficient documentation

## 2015-02-20 DIAGNOSIS — R531 Weakness: Secondary | ICD-10-CM | POA: Insufficient documentation

## 2015-02-20 DIAGNOSIS — G620 Drug-induced polyneuropathy: Secondary | ICD-10-CM | POA: Insufficient documentation

## 2015-02-20 DIAGNOSIS — R17 Unspecified jaundice: Secondary | ICD-10-CM | POA: Diagnosis not present

## 2015-02-20 DIAGNOSIS — L299 Pruritus, unspecified: Secondary | ICD-10-CM | POA: Diagnosis not present

## 2015-02-20 DIAGNOSIS — C252 Malignant neoplasm of tail of pancreas: Secondary | ICD-10-CM | POA: Diagnosis not present

## 2015-02-20 DIAGNOSIS — R21 Rash and other nonspecific skin eruption: Secondary | ICD-10-CM | POA: Diagnosis not present

## 2015-02-20 DIAGNOSIS — Z79899 Other long term (current) drug therapy: Secondary | ICD-10-CM | POA: Diagnosis not present

## 2015-02-20 DIAGNOSIS — R5382 Chronic fatigue, unspecified: Secondary | ICD-10-CM | POA: Insufficient documentation

## 2015-02-20 DIAGNOSIS — R5383 Other fatigue: Secondary | ICD-10-CM | POA: Insufficient documentation

## 2015-02-20 DIAGNOSIS — R188 Other ascites: Secondary | ICD-10-CM | POA: Diagnosis not present

## 2015-02-20 DIAGNOSIS — R0602 Shortness of breath: Secondary | ICD-10-CM | POA: Insufficient documentation

## 2015-02-20 DIAGNOSIS — E785 Hyperlipidemia, unspecified: Secondary | ICD-10-CM | POA: Insufficient documentation

## 2015-02-20 DIAGNOSIS — R350 Frequency of micturition: Secondary | ICD-10-CM | POA: Insufficient documentation

## 2015-02-20 DIAGNOSIS — T451X5S Adverse effect of antineoplastic and immunosuppressive drugs, sequela: Secondary | ICD-10-CM | POA: Diagnosis not present

## 2015-02-20 DIAGNOSIS — Z7982 Long term (current) use of aspirin: Secondary | ICD-10-CM | POA: Insufficient documentation

## 2015-02-20 DIAGNOSIS — I10 Essential (primary) hypertension: Secondary | ICD-10-CM | POA: Insufficient documentation

## 2015-02-20 DIAGNOSIS — G510 Bell's palsy: Secondary | ICD-10-CM | POA: Insufficient documentation

## 2015-02-20 DIAGNOSIS — R634 Abnormal weight loss: Secondary | ICD-10-CM | POA: Insufficient documentation

## 2015-02-20 DIAGNOSIS — Z794 Long term (current) use of insulin: Secondary | ICD-10-CM | POA: Insufficient documentation

## 2015-02-20 DIAGNOSIS — L658 Other specified nonscarring hair loss: Secondary | ICD-10-CM | POA: Insufficient documentation

## 2015-02-20 DIAGNOSIS — Z9221 Personal history of antineoplastic chemotherapy: Secondary | ICD-10-CM | POA: Insufficient documentation

## 2015-02-20 DIAGNOSIS — D509 Iron deficiency anemia, unspecified: Secondary | ICD-10-CM | POA: Insufficient documentation

## 2015-02-20 DIAGNOSIS — E119 Type 2 diabetes mellitus without complications: Secondary | ICD-10-CM | POA: Diagnosis not present

## 2015-02-20 DIAGNOSIS — E039 Hypothyroidism, unspecified: Secondary | ICD-10-CM | POA: Diagnosis not present

## 2015-02-20 LAB — CBC WITH DIFFERENTIAL/PLATELET
Basophils Absolute: 0 10*3/uL (ref 0–0.1)
Basophils Relative: 1 %
Eosinophils Absolute: 0.2 10*3/uL (ref 0–0.7)
Eosinophils Relative: 3 %
HCT: 27.8 % — ABNORMAL LOW (ref 40.0–52.0)
Hemoglobin: 8.9 g/dL — ABNORMAL LOW (ref 13.0–18.0)
Lymphocytes Relative: 17 %
Lymphs Abs: 1 10*3/uL (ref 1.0–3.6)
MCH: 25.5 pg — ABNORMAL LOW (ref 26.0–34.0)
MCHC: 32.2 g/dL (ref 32.0–36.0)
MCV: 79.3 fL — ABNORMAL LOW (ref 80.0–100.0)
Monocytes Absolute: 0.8 10*3/uL (ref 0.2–1.0)
Monocytes Relative: 14 %
Neutro Abs: 3.9 10*3/uL (ref 1.4–6.5)
Neutrophils Relative %: 65 %
Platelets: 301 10*3/uL (ref 150–440)
RBC: 3.5 MIL/uL — ABNORMAL LOW (ref 4.40–5.90)
RDW: 23.1 % — ABNORMAL HIGH (ref 11.5–14.5)
WBC: 6 10*3/uL (ref 3.8–10.6)

## 2015-02-20 LAB — COMPREHENSIVE METABOLIC PANEL
ALT: 89 U/L — ABNORMAL HIGH (ref 17–63)
AST: 100 U/L — ABNORMAL HIGH (ref 15–41)
Albumin: 2.3 g/dL — ABNORMAL LOW (ref 3.5–5.0)
Alkaline Phosphatase: 588 U/L — ABNORMAL HIGH (ref 38–126)
Anion gap: 4 — ABNORMAL LOW (ref 5–15)
BUN: 12 mg/dL (ref 6–20)
CO2: 24 mmol/L (ref 22–32)
Calcium: 8.5 mg/dL — ABNORMAL LOW (ref 8.9–10.3)
Chloride: 103 mmol/L (ref 101–111)
Creatinine, Ser: 0.98 mg/dL (ref 0.61–1.24)
GFR calc Af Amer: >60 mL/min (ref >60)
GFR calc non Af Amer: >60 mL/min (ref >60)
Glucose, Bld: 321 mg/dL — ABNORMAL HIGH (ref 65–99)
Potassium: 3.5 mmol/L (ref 3.5–5.1)
Sodium: 131 mmol/L — ABNORMAL LOW (ref 135–145)
Total Bilirubin: 1.4 mg/dL — ABNORMAL HIGH (ref 0.3–1.2)
Total Protein: 7.1 g/dL (ref 6.5–8.1)

## 2015-02-20 NOTE — Telephone Encounter (Signed)
Dr Mike Gip is going to call pt

## 2015-02-20 NOTE — Progress Notes (Signed)
[facility] day:  02/20/2015   Chief Complaint: Harry Roman is a 70 y.o. male with metastatic pancreatic cancer who is seen for review of interval studies and discussion regarding direction of therapy.  HPI: The patient was last seen in the medical oncology clinic on 02/08/2015.  At that time, he was seen for assessment prior to day 8 of cycle #1 gemcitabine  alone.  Liver function tests were increasing likely secondary to progressive disease.  Symptomatically, he denied any nausea, vomiting or abdominal pain.  He was taking oral iron.  He was using Miralax and Senna for constipation.  He was taking potassium twice a day.  He had not needed Lasix.  He was "not eating much".  CA19.9 was 1695 (prior value 465 on 11/17 and 980 on 01/25/2015).  Because of an increasing alkaline phosphatase and bilirubin, he was scheduled for RUQ ultrasound.  Ultrasound on 02/15/2015 revealed borderline common bile duct dilatation (8 mm) with large volume ascites, and stable left hepatic lobe liver lesion.  Symptomatically, he is eating well. Bowel movements are "okay". Activity level is "the same". He notes new pruritus.  Past Medical History  Diagnosis Date  . Hypertension   . Diabetes mellitus without complication ([place])   . Hyperlipidemia   . Ascites 2016  . Neuropathy ([place]) 09/30/2014  . Hypothyroidism   . Cancer ([place]) 2016    pancreas  . 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 Hemorrhoid surgery    . Colonoscopy  Jan 2015    Dr Donnella Sham  . Peritoneal fluid aspiration  2016   Family History  Problem Relation Age of Onset  . Hypertension Mother   . Diabetes Mother   . Diabetes Sister   . Diabetes Brother     Social History:  reports that he has never smoked. He does not have any smokeless tobacco history on file. He reports that he does not drink alcohol or use illicit drugs.  The  patient is accompanied by his wife.  Allergies: No Known Allergies  Current Medications: Current Outpatient Prescriptions  Medication Sig Dispense Refill  . aspirin 81 MG tablet Take 81 mg by mouth daily.    . carvedilol (COREG) 12.5 MG tablet Take 12.5 mg by mouth 2 (two) times daily with a meal.    . enalapril (VASOTEC) 20 MG tablet Take 30 mg by mouth daily.     . ferrous sulfate 325 (65 FE) MG tablet Take 325 mg by mouth daily with breakfast.    . gabapentin (NEURONTIN) 100 MG capsule Take 2 capsules (200 mg total) by mouth at bedtime. 60 capsule 0  . insulin regular (NOVOLIN R,HUMULIN R) 100 units/mL injection Sliding Scale  Glucose 200-300 take 2 units Glucose 301-400 take 4 units  Glucose 401-500 take 6 units 10 mL 1  . KLOR-CON M20 20 MEQ tablet Take 1 tablet (20 mEq total) by mouth 2 (two) times daily. 180 tablet 0  . levothyroxine (SYNTHROID, LEVOTHROID) 50 MCG tablet Take 50 mcg by mouth daily before breakfast.    . linagliptin (TRADJENTA) 5 MG TABS tablet Take 5 mg by mouth daily.    Marland Kitchen lovastatin (MEVACOR) 10 MG tablet Take 10 mg by mouth at bedtime.    Marland Kitchen oxyCODONE-acetaminophen (ROXICET) 5-325 MG tablet Take 1 tablet by mouth every 6 (six) hours as needed for severe pain. 10 tablet 0  . RELION ULTIMA  TEST test strip      No current facility-administered medications for this visit.   Facility-Administered Medications Ordered in Other Visits  Medication Dose Route Frequency Provider Last Rate Last Dose  . sodium chloride 0.9 % 100 mL with potassium chloride 20 mEq, magnesium sulfate 2 g infusion   Intravenous Continuous Lequita Asal, MD   Stopped at 08/10/14 1655  . sodium chloride 0.9 % injection 10 mL  10 mL Intracatheter PRN Lequita Asal, MD   10 mL at 08/10/14 1410  . sodium chloride 0.9 % injection 10 mL  10 mL Intravenous PRN Lequita Asal, MD   10 mL at 08/31/14 0927  . sodium chloride 0.9 % injection 10 mL  10 mL Intravenous PRN Lequita Asal,  MD   10 mL at 09/28/14 1008  . sodium chloride 0.9 % injection 10 mL  10 mL Intracatheter PRN Lequita Asal, MD   10 mL at 01/10/15 8341   Review of Systems:  GENERAL: Feels "the same".  No fevers or sweats.  Weight up 6 pounds (fluid). PERFORMANCE STATUS (ECOG):  3 HEENT:  No visual changes, runny nose, sore throat, mouth sores or tenderness. Lungs:  Little shortness of breath.  No cough.  No hemoptysis. Cardiac:  No chest pain, palpitations, orthopnea, or PND. GI:  Appetite good.  No nausea, vomiting, diarrhea, constipation, melena or hematochezia. GU:  No urgency, frequency, dysuria, or hematuria. Musculoskeletal:  No back pain.  No joint pain.  No muscle tenderness. Extremities:  No lower extremity edema. Skin:  Pruritus.  No rashes or skin changes. Neuro:  Bell's palsy, improving.  Neuropathy (stable off Neurontin).  No headache, focal weakness, balance or coordination issues. Endocrine:  Diabetes.  Thyroid disease on levothyroxine.  No hot flashes or night sweats. Psych:  No mood changes, depression or anxiety. Pain:  No pain. Review of systems:  All other systems reviewed and found to be negative.  Physical Exam: Blood pressure 164/97, pulse 64, temperature 99 F (37.2 C), temperature source Tympanic, resp. rate 18, height 5' 7"  (1.702 m), weight 127 lb 13.9 oz (58 kg).  GENERAL:  Thin chronically fatigued appearing elderly gentleman sitting in a wheelchair in the exam room in no acute distress. MENTAL STATUS:  Alert and oriented to person, place and time. HEAD:  Wearing a cap.  Alopecia.  Temporal wasting.  Right sided facial droop secondary to Bell's palsy, improved.  Normocephalic, atraumatic, no Cushingoid features. EYES:  Brown eyes. Pupils equal round and reactive to light and accomodation.  No conjunctivitis or scleral icterus. ENT:  Oropharynx clear without lesion.  Dentures.  Tongue normal. Mucous membranes moist.  RESPIRATORY: Poor respiratory excursion.  Clear to  auscultation without rales, wheezes or rhonchi. CARDIOVASCULAR:  Regular rate and rhythm without murmur, rub or gallop. ABDOMEN:  Moderate abdominal distention.  Shifting dullness c/w ascites.  Soft, non-tender, with active bowel sounds, and no appreciable hepatosplenomegaly.   SKIN:  No rashes, ulcers or lesions. EXTREMITIES: No lower extremity edema.  No palpable cords. LYMPH NODES: No palpable cervical, supraclavicular, axillary or inguinal adenopathy  NEUROLOGICAL: Right sided Bell's palsy (improved). PSYCH:  Appropriate.  No visits with results within 3 Day(s) from this visit. Latest known visit with results is:  Appointment on 02/08/2015  Component Date Value Ref Range Status  . CA 19-9 02/08/2015 1695* 0 - 35 U/mL Final   Comment: (NOTE) Results confirmed on dilution. Roche ECLIA methodology Performed At: [facility] [place],  [place] [id number] Lindon Romp MD [id number]   . WBC 02/08/2015 5.8  3.8 - 10.6 K/uL Final  . RBC 02/08/2015 3.63* 4.40 - 5.90 MIL/uL Final  . Hemoglobin 02/08/2015 9.0* 13.0 - 18.0 g/dL Final  . HCT 02/08/2015 28.2* 40.0 - 52.0 % Final  . MCV 02/08/2015 77.7* 80.0 - 100.0 fL Final  . MCH 02/08/2015 24.9* 26.0 - 34.0 pg Final  . MCHC 02/08/2015 32.0  32.0 - 36.0 g/dL Final  . RDW 02/08/2015 19.7* 11.5 - 14.5 % Final  . Platelets 02/08/2015 193  150 - 440 K/uL Final  . Neutrophils Relative % 02/08/2015 61   Final  . Neutro Abs 02/08/2015 3.5  1.4 - 6.5 K/uL Final  . Lymphocytes Relative 02/08/2015 20   Final  . Lymphs Abs 02/08/2015 1.2  1.0 - 3.6 K/uL Final  . Monocytes Relative 02/08/2015 16   Final  . Monocytes Absolute 02/08/2015 0.9  0.2 - 1.0 K/uL Final  . Eosinophils Relative 02/08/2015 2   Final  . Eosinophils Absolute 02/08/2015 0.1  0 - 0.7 K/uL Final  . Basophils Relative 02/08/2015 1   Final  . Basophils Absolute 02/08/2015 0.1  0 - 0.1 K/uL Final  . Sodium 02/08/2015 133* 135 - 145 mmol/L Final  .  Potassium 02/08/2015 3.5  3.5 - 5.1 mmol/L Final  . Chloride 02/08/2015 102  101 - 111 mmol/L Final  . CO2 02/08/2015 25  22 - 32 mmol/L Final  . Glucose, Bld 02/08/2015 363* 65 - 99 mg/dL Final  . BUN 02/08/2015 11  6 - 20 mg/dL Final  . Creatinine, Ser 02/08/2015 0.75  0.61 - 1.24 mg/dL Final  . Calcium 02/08/2015 8.7* 8.9 - 10.3 mg/dL Final  . Total Protein 02/08/2015 6.9  6.5 - 8.1 g/dL Final  . Albumin 02/08/2015 2.3* 3.5 - 5.0 g/dL Final  . AST 02/08/2015 114* 15 - 41 U/L Final  . ALT 02/08/2015 128* 17 - 63 U/L Final  . Alkaline Phosphatase 02/08/2015 356* 38 - 126 U/L Final  . Total Bilirubin 02/08/2015 0.9  0.3 - 1.2 mg/dL Final  . GFR calc non Af Amer 02/08/2015 >60  >60 mL/min Final  . GFR calc Af Amer 02/08/2015 >60  >60 mL/min Final   Comment: (NOTE) The eGFR has been calculated using the CKD EPI equation. This calculation has not been validated in all clinical situations. eGFR's persistently <60 mL/min signify possible Chronic Kidney Disease.   . Anion gap 02/08/2015 6  5 - 15 Final    Assessment:  Harry Roman is a 70 y.o. African-American gentleman with metastatic pancreatic cancer.  He presented with unexplained weight loss 1 year, intermittent nausea vomiting 1 month, and a 1 week history of abdominal distention.  He lost 30-40 pounds in the past year.  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 Chest CT on 05/01/2014 revealed no evidence of metastatic disease.  Colonoscopy on 02/2013 was negative.  CA19-9 was 713 on 04/21/2014.  He underwent paracentesis x 4 (1.85 liters on 04/21/2014, 2.9 liters on 05/01/2014, 5 liters on 07/31/2014, and 6.5 liters on 01/26/2015).  Cytology was negative.  Omental  biopsy on 04/21/2014 revealed metastatic adenocarcinoma consistent with pancreatic cancer (CA19-9 stains were positive).    He  is B12 deficient (B12 was 34 on 04/21/2014).  He is on monthly B12 supplimentation (last 01/04/2015).  He has a microcytic anemia consistent with iron deficiency.  He is status post 10 cycles of gemcitabine and Abraxane (05/04/2014 - 01/04/2015).  He developed a neuropathy with cycle #5 and thus Abraxane was decreased.  Because of ongoing symptomatic neuropathy, Abraxane was decreased with cycle #8.  He developed thrombocytopenia with cycle #6 and thus chemotherapy was switched to 2 weeks on/1 week off.  He last required a paracentesis on 01/26/2015.  CA19-9 initially decreased then increased:  1322 on 05/04/2014, 738 on 06/01/2014, 365 on 06/29/2014, 157 on 07/27/2014, 176 on 09/21/2014, 148 on 10/19/2014, and 220 on 11/23/2014, 465 on 01/04/2015, 980 on 01/25/2015, and 1695 on 02/08/2015..  Abdominal and pelvic CT scan on 07/24/2014 revealed slight interval decrease in the posterior left hepatic lesion and in the bulkiness of the omental disease.  The portal vein was attenuated, but patent.  There was increased ascites.    Abdominal and pelvic CT scan on 10/26/2014 revealed reduced omental caking with the current indistinct tumor about 2.4 cm in thickness (previously 3.7 cm). He has ascites, a small right and trace left pleural effusion (improved).  There was a stable lesion in segment 4B of the liver. There was a stable hypointense enhancing mass in the body and tail of the pancreas. 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  Abdominal and pelvic CT scan from 01/01/2015 revealed increased ascites, but no substantial change in the right omental/mesenteric disease.  The mass in the pancreas was smaller.  There was interval development of a short segment of focal aortic dissection at the level of the IMA extending 3 cm in the craniocaudal  length.  The posterior liver lesion was stable.  Chest CT angiogram on 01/26/2015 revealed no evidence of pulmonary embolism, small bilateral pleural effusions with bibasilar atelctasis, and a 2 cm density in the RLL representing round atelectasis.  He received gemcitabine alone on 02/01/2015.  Liver enzymes are elevated (stable).  Alkaline phosphatase has increased room 82 (normal) on 01/25/2015 to 356 today.  Bilirubin is normal (0.9), but increasing (prior 0.3).  Hepatitis B and C testing was negative on 02/01/2015.  RUQ ultrasound on 02/15/2015 revealed borderline common bile duct dilatation (8 mm) with large volume ascites, and stable left hepatic lobe liver lesion.  CA19-9 is increasing and worrisome for diffuse liver infiltration.   He was diagnosed with right sided Bell's palsy on 10/09/2014.  He has a grade II Abraxane neuropathy.   Symptomatically, he denies any nausea, vomiting or RUQ pain.  Energy level is poor.  He has increasing ascites.  Code status is DNR/DNI.   Plan: 1.  Labs today: CBC with diff, CMP. 2.  Review ultrasound and labs.  Discuss findings consistent with progressive pancreatic cancer. 3.  Discuss no chemotherapy today.  Patient in agreement. 4.  Discuss increasing ascites.  Discuss plan for therapeutic paracentesis when needed. 5.  RTC in 2 weeks for MD assessment and labs (CBC with diff, PT/INR).   Lequita Asal, MD  02/20/2015, 10:04 AM

## 2015-02-20 NOTE — Telephone Encounter (Signed)
Re:  worsening liver function tests  Called patient's wife about labs today.  Results reviewed.  Discussed progressive disease.  Agree with discontination of chemotherapy.  Supportive care or hospice discussed.  Discussed focusing on quality of life.  Reviewed planned follow-up in 2 weeks or sooner if any concerns.  Reviewed plan for therapeutic paracentesis when patient felt needed.  Several questions asked and answered.  Lequita Asal, MD

## 2015-03-06 ENCOUNTER — Ambulatory Visit: Payer: Medicare Other | Admitting: Hematology and Oncology

## 2015-03-06 ENCOUNTER — Other Ambulatory Visit: Payer: Medicare Other

## 2015-03-08 ENCOUNTER — Inpatient Hospital Stay (HOSPITAL_BASED_OUTPATIENT_CLINIC_OR_DEPARTMENT_OTHER): Payer: Medicare Other | Admitting: Hematology and Oncology

## 2015-03-08 ENCOUNTER — Inpatient Hospital Stay (HOSPITAL_BASED_OUTPATIENT_CLINIC_OR_DEPARTMENT_OTHER): Payer: Medicare Other

## 2015-03-08 ENCOUNTER — Ambulatory Visit
Admission: RE | Admit: 2015-03-08 | Discharge: 2015-03-08 | Disposition: A | Discharge status: Home / Self Care | Payer: Medicare Other | Source: Ambulatory Visit | Attending: Hematology and Oncology | Admitting: Hematology and Oncology

## 2015-03-08 DIAGNOSIS — R21 Rash and other nonspecific skin eruption: Secondary | ICD-10-CM

## 2015-03-08 DIAGNOSIS — R188 Other ascites: Secondary | ICD-10-CM

## 2015-03-08 DIAGNOSIS — C252 Malignant neoplasm of tail of pancreas: Secondary | ICD-10-CM | POA: Diagnosis not present

## 2015-03-08 DIAGNOSIS — D630 Anemia in neoplastic disease: Secondary | ICD-10-CM

## 2015-03-08 DIAGNOSIS — R350 Frequency of micturition: Secondary | ICD-10-CM

## 2015-03-08 DIAGNOSIS — R634 Abnormal weight loss: Secondary | ICD-10-CM

## 2015-03-08 DIAGNOSIS — E538 Deficiency of other specified B group vitamins: Secondary | ICD-10-CM

## 2015-03-08 DIAGNOSIS — G620 Drug-induced polyneuropathy: Secondary | ICD-10-CM

## 2015-03-08 DIAGNOSIS — D509 Iron deficiency anemia, unspecified: Secondary | ICD-10-CM

## 2015-03-08 DIAGNOSIS — C259 Malignant neoplasm of pancreas, unspecified: Secondary | ICD-10-CM | POA: Diagnosis not present

## 2015-03-08 DIAGNOSIS — T451X5S Adverse effect of antineoplastic and immunosuppressive drugs, sequela: Secondary | ICD-10-CM

## 2015-03-08 DIAGNOSIS — E876 Hypokalemia: Secondary | ICD-10-CM

## 2015-03-08 DIAGNOSIS — Z9221 Personal history of antineoplastic chemotherapy: Secondary | ICD-10-CM | POA: Diagnosis not present

## 2015-03-08 DIAGNOSIS — G510 Bell's palsy: Secondary | ICD-10-CM

## 2015-03-08 DIAGNOSIS — R5382 Chronic fatigue, unspecified: Secondary | ICD-10-CM

## 2015-03-08 LAB — COMPREHENSIVE METABOLIC PANEL
ALT: 45 U/L (ref 17–63)
AST: 72 U/L — ABNORMAL HIGH (ref 15–41)
Albumin: 2 g/dL — ABNORMAL LOW (ref 3.5–5.0)
Alkaline Phosphatase: 426 U/L — ABNORMAL HIGH (ref 38–126)
Anion gap: 5 (ref 5–15)
BUN: 10 mg/dL (ref 6–20)
CO2: 25 mmol/L (ref 22–32)
Calcium: 8.5 mg/dL — ABNORMAL LOW (ref 8.9–10.3)
Chloride: 104 mmol/L (ref 101–111)
Creatinine, Ser: 0.69 mg/dL (ref 0.61–1.24)
GFR calc Af Amer: >60 mL/min (ref >60)
GFR calc non Af Amer: >60 mL/min (ref >60)
Glucose, Bld: 221 mg/dL — ABNORMAL HIGH (ref 65–99)
Potassium: 3.4 mmol/L — ABNORMAL LOW (ref 3.5–5.1)
Sodium: 134 mmol/L — ABNORMAL LOW (ref 135–145)
Total Bilirubin: 1.3 mg/dL — ABNORMAL HIGH (ref 0.3–1.2)
Total Protein: 7.1 g/dL (ref 6.5–8.1)

## 2015-03-08 LAB — CBC WITH DIFFERENTIAL/PLATELET
Basophils Absolute: 0.1 10*3/uL (ref 0–0.1)
Basophils Relative: 1 %
Eosinophils Absolute: 0.4 10*3/uL (ref 0–0.7)
Eosinophils Relative: 5 %
HCT: 25.4 % — ABNORMAL LOW (ref 40.0–52.0)
Hemoglobin: 8.1 g/dL — ABNORMAL LOW (ref 13.0–18.0)
Lymphocytes Relative: 14 %
Lymphs Abs: 0.9 10*3/uL — ABNORMAL LOW (ref 1.0–3.6)
MCH: 25.3 pg — ABNORMAL LOW (ref 26.0–34.0)
MCHC: 32 g/dL (ref 32.0–36.0)
MCV: 79.1 fL — ABNORMAL LOW (ref 80.0–100.0)
Monocytes Absolute: 0.9 10*3/uL (ref 0.2–1.0)
Monocytes Relative: 13 %
Neutro Abs: 4.8 10*3/uL (ref 1.4–6.5)
Neutrophils Relative %: 67 %
Platelets: 262 10*3/uL (ref 150–440)
RBC: 3.22 MIL/uL — ABNORMAL LOW (ref 4.40–5.90)
RDW: 22.2 % — ABNORMAL HIGH (ref 11.5–14.5)
WBC: 7 10*3/uL (ref 3.8–10.6)

## 2015-03-08 LAB — PROTIME-INR
INR: 2.12
Prothrombin Time: 23.6 seconds — ABNORMAL HIGH (ref 11.4–15.0)

## 2015-03-08 MED ORDER — HYDROXYZINE PAMOATE 25 MG PO CAPS: ORAL_CAPSULE | ORAL | Status: DC

## 2015-03-08 NOTE — Progress Notes (Signed)
[facility] day:  03/08/2015   Chief Complaint: Harry Roman is a 70 y.o. male with metastatic pancreatic cancer who is seen for 2 week assessment.  HPI: The patient was last seen in the medical oncology clinic on 02/20/2015.  At that time, he was noted to have progressive disease.  Decision was made to discontinue chemotherapy.  Supportive care was discussed.  During the interim, he has gained 10 pounds.  His appetite is "ok".  He has gained a lot of fluid weight with increasing abdominal girth.  He has had some increased shortness of breath secondary to his abdominal distention.  Last week he had a diffuse pruritic body rash.  He has been using calamine lotion and Benadryl.  The Benadryl makes him sleepy.  He inquires why he voids frequently.  He is not taking Lasix.  His wife keeps a glass of ice water at his bedside which he drinks regularly.  He denies any pain.  Past Medical History  Diagnosis Date  . Hypertension   . Diabetes mellitus without complication ([place])   . Hyperlipidemia   . Ascites 2016  . Neuropathy ([place]) 09/30/2014  . Hypothyroidism   . Cancer ([place]) 2016    pancreas  . 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 Hemorrhoid surgery    . Colonoscopy  Jan 2015    Dr Donnella Sham  . Peritoneal fluid aspiration  2016   Family History  Problem Relation Age of Onset  . Hypertension Mother   . Diabetes Mother   . Diabetes Sister   . Diabetes Brother     Social History:  reports that he has never smoked. He does not have any smokeless tobacco history on file. He reports that he does not drink alcohol or use illicit drugs.  The patient is accompanied by his wife.  Allergies: No Known Allergies  Current Medications: Current Outpatient Prescriptions  Medication Sig Dispense Refill  . aspirin 81 MG tablet Take 81 mg by mouth daily.    . carvedilol (COREG) 12.5  MG tablet Take 12.5 mg by mouth 2 (two) times daily with a meal.    . enalapril (VASOTEC) 20 MG tablet Take 30 mg by mouth daily.     . ferrous sulfate 325 (65 FE) MG tablet Take 325 mg by mouth daily with breakfast.    . gabapentin (NEURONTIN) 100 MG capsule Take 2 capsules (200 mg total) by mouth at bedtime. 60 capsule 0  . insulin regular (NOVOLIN R,HUMULIN R) 100 units/mL injection Sliding Scale  Glucose 200-300 take 2 units Glucose 301-400 take 4 units  Glucose 401-500 take 6 units 10 mL 1  . KLOR-CON M20 20 MEQ tablet Take 1 tablet (20 mEq total) by mouth 2 (two) times daily. 180 tablet 0  . levothyroxine (SYNTHROID, LEVOTHROID) 50 MCG tablet Take 50 mcg by mouth daily before breakfast.    . linagliptin (TRADJENTA) 5 MG TABS tablet Take 5 mg by mouth daily.    Marland Kitchen lovastatin (MEVACOR) 10 MG tablet Take 10 mg by mouth at bedtime.    Marland Kitchen oxyCODONE-acetaminophen (ROXICET) 5-325 MG tablet Take 1 tablet by mouth every 6 (six) hours as needed for severe pain. 10 tablet 0  . RELION ULTIMA TEST test strip      No current facility-administered medications for this visit.   Facility-Administered Medications Ordered in Other Visits  Medication Dose Route Frequency Provider  Last Rate Last Dose  . sodium chloride 0.9 % 100 mL with potassium chloride 20 mEq, magnesium sulfate 2 g infusion   Intravenous Continuous Lequita Asal, MD   Stopped at 08/10/14 1655  . sodium chloride 0.9 % injection 10 mL  10 mL Intracatheter PRN Lequita Asal, MD   10 mL at 08/10/14 1410  . sodium chloride 0.9 % injection 10 mL  10 mL Intravenous PRN Lequita Asal, MD   10 mL at 08/31/14 0927  . sodium chloride 0.9 % injection 10 mL  10 mL Intravenous PRN Lequita Asal, MD   10 mL at 09/28/14 1008  . sodium chloride 0.9 % injection 10 mL  10 mL Intracatheter PRN Lequita Asal, MD   10 mL at 01/10/15 [id number]   Review of Systems:  GENERAL: Fatigue.  No fevers or sweats.  Weight up 10 pounds (fluid  weight). PERFORMANCE STATUS (ECOG):  3 HEENT:  No visual changes, runny nose, sore throat, mouth sores or tenderness. Lungs:  Shortness of breath secondary to abdominal distention.  No cough.  No hemoptysis. Cardiac:  No chest pain, palpitations, orthopnea, or PND. GI:  Appetite "ok".  No nausea, vomiting, diarrhea, constipation, melena or hematochezia. GU:  No urgency, frequency, dysuria, or hematuria. Voids regularly. Musculoskeletal:  No back pain.  No joint pain.  No muscle tenderness. Extremities:  No lower extremity edema. Skin: Pruritic rash. Neuro:  Bell's palsy, nearly resolved.  Neuropathy (stable off Neurontin).  No headache, focal weakness, balance or coordination issues. Endocrine:  Diabetes.  Thyroid disease on levothyroxine.  No hot flashes or night sweats. Psych:  No mood changes, depression or anxiety. Pain:  No pain. Review of systems:  All other systems reviewed and found to be negative.  Physical Exam: Blood pressure 167/82, pulse 62, temperature 98.6 F (37 C), temperature source Tympanic, resp. rate 18, height 5\' 7"  (1.702 m), weight 137 lb 12.6 oz (62.5 kg).  GENERAL:  Thin chronically fatigued appearing elderly gentleman sitting in a wheelchair in the exam room in no acute distress.  Unable to get onto exam table. MENTAL STATUS:  Alert and oriented to person, place and time. HEAD:  Wearing a back cap.  Alopecia.  Temporal wasting.  Mild right sided facial droop secondary to Bell's palsy.  Normocephalic, atraumatic, no Cushingoid features. EYES:  Brown eyes. Pupils equal round and reactive to light and accomodation.  No conjunctivitis or scleral icterus. ENT:  Oropharynx clear without lesion.  Dentures.  Tongue normal. Mucous membranes moist.  RESPIRATORY: Poor respiratory excursion.  Clear to auscultation without rales, wheezes or rhonchi. CARDIOVASCULAR:  Regular rate and rhythm without murmur, rub or gallop. ABDOMEN:  Marked abdominal distention (tense).  Soft,  non-tender, with active bowel sounds, and no appreciable hepatosplenomegaly.   SKIN:  Faint rash.  Excoriations.  Skin dry. EXTREMITIES: No lower extremity edema.  No palpable cords. LYMPH NODES: No palpable cervical, supraclavicular, axillary or inguinal adenopathy  NEUROLOGICAL: General weakness.  Right sided Bell's palsy (nearly resolved). PSYCH:  Appropriate.  Appointment on 03/08/2015  Component Date Value Ref Range Status  . WBC 03/08/2015 7.0  3.8 - 10.6 K/uL Final  . RBC 03/08/2015 3.22* 4.40 - 5.90 MIL/uL Final  . Hemoglobin 03/08/2015 8.1* 13.0 - 18.0 g/dL Final  . HCT 03/08/2015 25.4* 40.0 - 52.0 % Final  . MCV 03/08/2015 79.1* 80.0 - 100.0 fL Final  . MCH 03/08/2015 25.3* 26.0 - 34.0 pg Final  . MCHC 03/08/2015 32.0  32.0 -  36.0 g/dL Final  . RDW 03/08/2015 22.2* 11.5 - 14.5 % Final  . Platelets 03/08/2015 262  150 - 440 K/uL Final  . Neutrophils Relative % 03/08/2015 67   Final  . Neutro Abs 03/08/2015 4.8  1.4 - 6.5 K/uL Final  . Lymphocytes Relative 03/08/2015 14   Final  . Lymphs Abs 03/08/2015 0.9* 1.0 - 3.6 K/uL Final  . Monocytes Relative 03/08/2015 13   Final  . Monocytes Absolute 03/08/2015 0.9  0.2 - 1.0 K/uL Final  . Eosinophils Relative 03/08/2015 5   Final  . Eosinophils Absolute 03/08/2015 0.4  0 - 0.7 K/uL Final  . Basophils Relative 03/08/2015 1   Final  . Basophils Absolute 03/08/2015 0.1  0 - 0.1 K/uL Final    Assessment:  EIN BUDZ is a 70 y.o. African-American gentleman with metastatic pancreatic cancer.  He presented with unexplained weight loss 1 year, intermittent nausea vomiting 1 month, and a 1 week history of abdominal distention.  He lost 30-40 pounds in the past year.  Abdominal and pelvic CT scan on 04/20/2014 revealed an ill-defined mass in the tail of the pancreas, a large amount of malignant ascites, peritoneal cake, constriction of the portal vein with associated portal venous hypertension, a 3.2 x 2.5 cm right hepatic lobe  metastasis, and thickening of the sigmoid colon. Chest CT on 05/01/2014 revealed no evidence of metastatic disease.  Colonoscopy on 02/2013 was negative.  CA19-9 was 713 on 04/21/2014.  He underwent paracentesis x 4 (1.85 liters on 04/21/2014, 2.9 liters on 05/01/2014, 5 liters on 07/31/2014, and 6.5 liters on 01/26/2015).  Cytology was negative.  Omental biopsy on 04/21/2014 revealed metastatic adenocarcinoma consistent with pancreatic cancer (CA19-9 stains were positive).    He is B12 deficient (B12 was 34 on 04/21/2014).  He is on monthly B12 supplimentation (last 02/01/2015).  He has a microcytic anemia consistent with iron deficiency.  He is status post 10 cycles of gemcitabine and Abraxane (05/04/2014 - 01/04/2015).  He developed a neuropathy with cycle #5 and thus Abraxane was decreased.  Because of ongoing symptomatic neuropathy, Abraxane was decreased with cycle #8.  He developed thrombocytopenia with cycle #6 and thus chemotherapy was switched to 2 weeks on/1 week off.  He last required a paracentesis on 01/26/2015.  CA19-9 initially decreased then increased:  1322 on 05/04/2014, 738 on 06/01/2014, 365 on 06/29/2014, 157 on 07/27/2014, 176 on 09/21/2014, 148 on 10/19/2014, and 220 on 11/23/2014, 465 on 01/04/2015, 980 on 01/25/2015, and 1695 on 02/08/2015..  Abdominal and pelvic CT scan on 07/24/2014 revealed slight interval decrease in the posterior left hepatic lesion and in the bulkiness of the omental disease.  The portal vein was attenuated, but patent.  There was increased ascites.    Abdominal and pelvic CT scan on 10/26/2014 revealed reduced omental caking with the current indistinct tumor about 2.4 cm in thickness (previously 3.7 cm). He has ascites, a small right and trace left pleural effusion (improved).  There was a stable lesion in segment 4B of the liver. There was a stable hypointense enhancing mass in the body and tail of the pancreas. 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  Abdominal and pelvic CT scan from 01/01/2015 revealed increased ascites, but no substantial change in the right omental/mesenteric disease.  The mass in the pancreas was smaller.  There was interval development of a short segment of focal aortic dissection at the level of the IMA extending 3 cm in  the craniocaudal length.  The posterior liver lesion was stable.  Chest CT angiogram on 01/26/2015 revealed no evidence of pulmonary embolism, small bilateral pleural effusions with bibasilar atelctasis, and a 2 cm density in the RLL representing round atelectasis.  He received gemcitabine alone on 02/01/2015.  Liver enzymes are elevated (stable).  Alkaline phosphatase has increased room 82 (normal) on 01/25/2015 to 588 on 02/20/2015.  Bilirubin had increased from 0.3 to 1.4.  Hepatitis B and C testing was negative on 02/01/2015.  Abdominal ultrasound on 02/15/2015 revealed borderline common bile duct dilatation (8 mm) and large volume ascites.  The left hepatic lobe lesion was stable.  CA19-9 increased to 1695.  He is on supportive care.  He was diagnosed with right sided Bell's palsy on 10/09/2014.  He has a grade II Abraxane neuropathy.   Symptomatically, he has symptomatic ascites.  His abdomen is distended and tense.  He has a pruritic rash.  Energy level is poor.  He has progressive anemia.  He is on oral iron.  He denies an bleeding.  Code status is DNR/DNI.   Plan: 1.  Labs today: CBC with diff, CMP, PT/INR. 2.  Schedule therapeutic abdominal paracentesis. 3.  Rx:  Hydroxyzine 12.5-25 mg po q 6 hours prn pruritus. 3.  Discuss Hospice.  Patient's wife declines at this time. 4.  Discuss progressive anemia.  Discuss indications for transfusion. 5.  RTC in 1 week for MD assess, labs (BC with diff, BMP, ferritin, iron studies, folate, type and screen), and B12.  Addendum:  The patient's potassium was slightly low (3.4).  He will take potassium 10 meq a day x 4  days.   Lequita Asal, MD  03/08/2015, 10:03 AM

## 2015-03-09 ENCOUNTER — Encounter: Payer: Self-pay | Admitting: Hematology and Oncology

## 2015-03-09 ENCOUNTER — Telehealth: Payer: Self-pay

## 2015-03-09 NOTE — Telephone Encounter (Signed)
Called pt's Wife per MD to make sure pt is taking 10 meq potassium daily for 4 days.  Wife states he takes two a day already and unsure of strength.  Asked Wife to please make sure he is taking 51meq a day.  Asked her to call us back on Monday when aware of strength.  Pt wife verbalized she would.  No other concerns noted

## 2015-03-12 NOTE — Telephone Encounter (Signed)
Erroneous encounter

## 2015-03-15 ENCOUNTER — Inpatient Hospital Stay: Payer: Medicare Other

## 2015-03-15 ENCOUNTER — Inpatient Hospital Stay (HOSPITAL_BASED_OUTPATIENT_CLINIC_OR_DEPARTMENT_OTHER): Payer: Medicare Other | Admitting: Hematology and Oncology

## 2015-03-15 VITALS — BP 133/84 | HR 56 | Temp 98.9°F | Resp 18 | Ht 68.0 in | Wt 122.4 lb

## 2015-03-15 DIAGNOSIS — C252 Malignant neoplasm of tail of pancreas: Secondary | ICD-10-CM

## 2015-03-15 DIAGNOSIS — R17 Unspecified jaundice: Secondary | ICD-10-CM

## 2015-03-15 DIAGNOSIS — R634 Abnormal weight loss: Secondary | ICD-10-CM

## 2015-03-15 DIAGNOSIS — E538 Deficiency of other specified B group vitamins: Secondary | ICD-10-CM

## 2015-03-15 DIAGNOSIS — L299 Pruritus, unspecified: Secondary | ICD-10-CM

## 2015-03-15 DIAGNOSIS — G510 Bell's palsy: Secondary | ICD-10-CM

## 2015-03-15 DIAGNOSIS — D630 Anemia in neoplastic disease: Secondary | ICD-10-CM

## 2015-03-15 DIAGNOSIS — D509 Iron deficiency anemia, unspecified: Secondary | ICD-10-CM

## 2015-03-15 DIAGNOSIS — G620 Drug-induced polyneuropathy: Secondary | ICD-10-CM

## 2015-03-15 DIAGNOSIS — R188 Other ascites: Secondary | ICD-10-CM | POA: Diagnosis not present

## 2015-03-15 DIAGNOSIS — T451X5S Adverse effect of antineoplastic and immunosuppressive drugs, sequela: Secondary | ICD-10-CM

## 2015-03-15 DIAGNOSIS — R5382 Chronic fatigue, unspecified: Secondary | ICD-10-CM

## 2015-03-15 LAB — CBC WITH DIFFERENTIAL/PLATELET
Basophils Absolute: 0.1 10*3/uL (ref 0–0.1)
Basophils Relative: 1 %
Eosinophils Absolute: 0.2 10*3/uL (ref 0–0.7)
Eosinophils Relative: 3 %
HCT: 25.6 % — ABNORMAL LOW (ref 40.0–52.0)
Hemoglobin: 8.3 g/dL — ABNORMAL LOW (ref 13.0–18.0)
Lymphocytes Relative: 21 %
Lymphs Abs: 1.3 10*3/uL (ref 1.0–3.6)
MCH: 25.7 pg — ABNORMAL LOW (ref 26.0–34.0)
MCHC: 32.6 g/dL (ref 32.0–36.0)
MCV: 79 fL — ABNORMAL LOW (ref 80.0–100.0)
Monocytes Absolute: 0.8 10*3/uL (ref 0.2–1.0)
Monocytes Relative: 14 %
Neutro Abs: 3.8 10*3/uL (ref 1.4–6.5)
Neutrophils Relative %: 61 %
Platelets: 258 10*3/uL (ref 150–440)
RBC: 3.23 MIL/uL — ABNORMAL LOW (ref 4.40–5.90)
RDW: 21.8 % — ABNORMAL HIGH (ref 11.5–14.5)
WBC: 6.2 10*3/uL (ref 3.8–10.6)

## 2015-03-15 LAB — SAMPLE TO BLOOD BANK

## 2015-03-15 LAB — BASIC METABOLIC PANEL
Anion gap: 6 (ref 5–15)
BUN: 10 mg/dL (ref 6–20)
CO2: 22 mmol/L (ref 22–32)
Calcium: 8.3 mg/dL — ABNORMAL LOW (ref 8.9–10.3)
Chloride: 105 mmol/L (ref 101–111)
Creatinine, Ser: 0.69 mg/dL (ref 0.61–1.24)
GFR calc Af Amer: >60 mL/min (ref >60)
GFR calc non Af Amer: >60 mL/min (ref >60)
Glucose, Bld: 224 mg/dL — ABNORMAL HIGH (ref 65–99)
Potassium: 3.8 mmol/L (ref 3.5–5.1)
Sodium: 133 mmol/L — ABNORMAL LOW (ref 135–145)

## 2015-03-15 LAB — IRON AND TIBC
Iron: 22 ug/dL — ABNORMAL LOW (ref 45–182)
Saturation Ratios: 13 % — ABNORMAL LOW (ref 17.9–39.5)
TIBC: 169 ug/dL — ABNORMAL LOW (ref 250–450)
UIBC: 147 ug/dL

## 2015-03-15 LAB — FOLATE: Folate: 38 ng/mL (ref >5.9)

## 2015-03-15 LAB — FERRITIN: Ferritin: 632 ng/mL — ABNORMAL HIGH (ref 24–336)

## 2015-03-15 MED ORDER — CYANOCOBALAMIN 1000 MCG/ML IJ SOLN: 1000.0000 ug | Freq: Once | INTRAMUSCULAR | Status: DC

## 2015-03-15 NOTE — Progress Notes (Signed)
[facility] day:  03/15/2015   Chief Complaint: Harry Roman is a 70 y.o. male with metastatic pancreatic cancer who is seen for 1 week assessment.  HPI: The patient was last seen in the medical oncology clinic on 03/08/2015.  At that time, he had gained 10 pounds.  His appetite is "ok".  He  had some increased shortness of breath secondary to his abdominal distention from ascites. He was using calamine lotion and Benadryl for a diffuse pruritic rash.  Benadryl made him sleepy.  He denied any pain.  He underwent paracentesis of 4.4 liters of blood tinged ascites on 03/08/2015.  He tolerated the procedure well.  Symptomatically, he states that he feels the same.  He denies any pain.  He is eating less.  Hydroxyzine has helped with his pruritus.  A whole pill makes him groggy.    Past Medical History  Diagnosis Date  . Hypertension   . Diabetes mellitus without complication ([place])   . Hyperlipidemia   . Ascites 2016  . Neuropathy ([place]) 09/30/2014  . Hypothyroidism   . Cancer ([place]) 2016    pancreas  . 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 Hemorrhoid surgery    . Colonoscopy  Jan 2015    Dr Donnella Sham  . Peritoneal fluid aspiration  2016   Family History  Problem Relation Age of Onset  . Hypertension Mother   . Diabetes Mother   . Diabetes Sister   . Diabetes Brother     Social History:  reports that he has never smoked. He does not have any smokeless tobacco history on file. He reports that he does not drink alcohol or use illicit drugs.  The patient is accompanied by his wife.  Allergies: No Known Allergies  Current Medications: Current Outpatient Prescriptions  Medication Sig Dispense Refill  . aspirin 81 MG tablet Take 81 mg by mouth daily.    . carvedilol (COREG) 12.5 MG tablet Take 12.5 mg by mouth 2 (two) times daily with a meal.    . enalapril (VASOTEC) 20  MG tablet Take 30 mg by mouth daily.     . ferrous sulfate 325 (65 FE) MG tablet Take 325 mg by mouth daily with breakfast.    . gabapentin (NEURONTIN) 100 MG capsule Take 2 capsules (200 mg total) by mouth at bedtime. 60 capsule 0  . hydrOXYzine (VISTARIL) 25 MG capsule Take 0.5 - 1 tab (12.79m - 25 mg) every 6 hours as needed for itching 30 capsule 1  . insulin regular (NOVOLIN R,HUMULIN R) 100 units/mL injection Sliding Scale  Glucose 200-300 take 2 units Glucose 301-400 take 4 units  Glucose 401-500 take 6 units 10 mL 1  . KLOR-CON M20 20 MEQ tablet Take 1 tablet (20 mEq total) by mouth 2 (two) times daily. 180 tablet 0  . levothyroxine (SYNTHROID, LEVOTHROID) 50 MCG tablet Take 50 mcg by mouth daily before breakfast.    . linagliptin (TRADJENTA) 5 MG TABS tablet Take 5 mg by mouth daily.    .Marland Kitchenlovastatin (MEVACOR) 10 MG tablet Take 10 mg by mouth at bedtime.    .Marland KitchenoxyCODONE-acetaminophen (ROXICET) 5-325 MG tablet Take 1 tablet by mouth every 6 (six) hours as needed for severe pain. 10 tablet 0  . RELION ULTIMA TEST test strip      No current facility-administered medications for this visit.   Facility-Administered Medications Ordered  in Other Visits  Medication Dose Route Frequency Provider Last Rate Last Dose  . cyanocobalamin ((VITAMIN B-12)) injection 1,000 mcg  1,000 mcg Intramuscular Once Lequita Asal, MD      . sodium chloride 0.9 % 100 mL with potassium chloride 20 mEq, magnesium sulfate 2 g infusion   Intravenous Continuous Lequita Asal, MD   Stopped at 08/10/14 1655  . sodium chloride 0.9 % injection 10 mL  10 mL Intracatheter PRN Lequita Asal, MD   10 mL at 08/10/14 1410  . sodium chloride 0.9 % injection 10 mL  10 mL Intravenous PRN Lequita Asal, MD   10 mL at 08/31/14 0927  . sodium chloride 0.9 % injection 10 mL  10 mL Intravenous PRN Lequita Asal, MD   10 mL at 09/28/14 1008  . sodium chloride 0.9 % injection 10 mL  10 mL Intracatheter PRN  Lequita Asal, MD   10 mL at 01/10/15 5277   Review of Systems:  GENERAL: Fatigue.  No fevers or sweats.  Weight down 15 pounds. PERFORMANCE STATUS (ECOG):  3 HEENT:  Eyes a little yellow.  No visual changes, runny nose, sore throat, mouth sores or tenderness. Lungs:  No shortness of breath or cough.  No hemoptysis. Cardiac:  No chest pain, palpitations, orthopnea, or PND. GI:  Eating less.  Little diarrhea.  No nausea, vomiting, constipation, melena or hematochezia. GU:  No urgency, frequency, dysuria, or hematuria. Voids regularly. Musculoskeletal:  No back pain.  No joint pain.  No muscle tenderness. Extremities:  No lower extremity edema. Skin: Pruritic rash, improved. Neuro:  Bell's palsy, resolved.  Neuropathy (stable off Neurontin).  No headache, focal weakness, balance or coordination issues. Endocrine:  Diabetes.  Thyroid disease on levothyroxine.  No hot flashes or night sweats. Psych:  No mood changes, depression or anxiety. Pain:  No pain. Review of systems:  All other systems reviewed and found to be negative.  Physical Exam: Blood pressure 133/84, pulse 56, temperature 98.9 F (37.2 C), temperature source Tympanic, resp. rate 18, height 5' 8"  (1.727 m), weight 122 lb 5.7 oz (55.5 kg).  GENERAL:  Thin chronically fatigued appearing elderly gentleman sitting in a wheelchair in the exam room in no acute distress.  Unable to get onto exam table. MENTAL STATUS:  Alert and oriented to person, place and time. HEAD:  Wearing a back cap.  Alopecia.  Temporal wasting.  Slight facial asymmetry secondary to right sided Bell's palsy.  Normocephalic, atraumatic, no Cushingoid features. EYES:  Brown eyes. Sclera icteric.  Pupils equal round and reactive to light and accomodation.  No conjunctivitis. ENT:  Oropharynx clear without lesion.  Dentures.  Tongue normal. Mucous membranes moist.  RESPIRATORY: Poor respiratory excursion.  Clear to auscultation without rales, wheezes or  rhonchi. CARDIOVASCULAR:  Regular rate and rhythm without murmur, rub or gallop. ABDOMEN:  Decreased abdominal distention.  Soft, non-tender, with active bowel sounds, and no appreciable hepatosplenomegaly.   SKIN:  Skin dry.  No erythema. EXTREMITIES: Trace ankle edema. No palpable cords. LYMPH NODES: No palpable cervical, supraclavicular, axillary or inguinal adenopathy  NEUROLOGICAL: General weakness.  Right sided Bell's palsy (nearly resolved). PSYCH:  Appropriate.  Appointment on 03/15/2015  Component Date Value Ref Range Status  . WBC 03/15/2015 6.2  3.8 - 10.6 K/uL Final  . RBC 03/15/2015 3.23* 4.40 - 5.90 MIL/uL Final  . Hemoglobin 03/15/2015 8.3* 13.0 - 18.0 g/dL Final  . HCT 03/15/2015 25.6* 40.0 - 52.0 % Final  .  MCV 03/15/2015 79.0* 80.0 - 100.0 fL Final  . MCH 03/15/2015 25.7* 26.0 - 34.0 pg Final  . MCHC 03/15/2015 32.6  32.0 - 36.0 g/dL Final  . RDW 03/15/2015 21.8* 11.5 - 14.5 % Final  . Platelets 03/15/2015 258  150 - 440 K/uL Final  . Neutrophils Relative % 03/15/2015 61   Final  . Neutro Abs 03/15/2015 3.8  1.4 - 6.5 K/uL Final  . Lymphocytes Relative 03/15/2015 21   Final  . Lymphs Abs 03/15/2015 1.3  1.0 - 3.6 K/uL Final  . Monocytes Relative 03/15/2015 14   Final  . Monocytes Absolute 03/15/2015 0.8  0.2 - 1.0 K/uL Final  . Eosinophils Relative 03/15/2015 3   Final  . Eosinophils Absolute 03/15/2015 0.2  0 - 0.7 K/uL Final  . Basophils Relative 03/15/2015 1   Final  . Basophils Absolute 03/15/2015 0.1  0 - 0.1 K/uL Final  . Sodium 03/15/2015 133* 135 - 145 mmol/L Final  . Potassium 03/15/2015 3.8  3.5 - 5.1 mmol/L Final  . Chloride 03/15/2015 105  101 - 111 mmol/L Final  . CO2 03/15/2015 22  22 - 32 mmol/L Final  . Glucose, Bld 03/15/2015 224* 65 - 99 mg/dL Final  . BUN 03/15/2015 10  6 - 20 mg/dL Final  . Creatinine, Ser 03/15/2015 0.69  0.61 - 1.24 mg/dL Final  . Calcium 03/15/2015 8.3* 8.9 - 10.3 mg/dL Final  . GFR calc non Af Amer 03/15/2015 >60  >60  mL/min Final  . GFR calc Af Amer 03/15/2015 >60  >60 mL/min Final   Comment: (NOTE) The eGFR has been calculated using the CKD EPI equation. This calculation has not been validated in all clinical situations. eGFR's persistently <60 mL/min signify possible Chronic Kidney Disease.   . Anion gap 03/15/2015 6  5 - 15 Final  . Blood Bank Specimen 03/15/2015 SAMPLE AVAILABLE FOR TESTING   Final  . Sample Expiration 03/15/2015 03/18/2015   Final    Assessment:  Harry Roman is a 70 y.o. African-American gentleman with metastatic pancreatic cancer.  He presented with unexplained weight loss 1 year, intermittent nausea vomiting 1 month, and a 1 week history of abdominal distention.  He lost 30-40 pounds in the past year.  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 Chest CT on 05/01/2014 revealed no evidence of metastatic disease.  Colonoscopy on 02/2013 was negative.  CA19-9 was 713 on 04/21/2014.  He underwent paracentesis x 5 (1.85 liters on 04/21/2014, 2.9 liters on 05/01/2014, 5 liters on 07/31/2014, 6.5 liters on 01/26/2015, 03/08/2015).  Cytology was negative.  Omental biopsy on 04/21/2014 revealed metastatic adenocarcinoma consistent with pancreatic cancer (CA19-9 stains were positive).    He is B12 deficient (B12 was 34 on 04/21/2014).  He is on monthly B12 supplimentation (last 02/01/2015).  He has a microcytic anemia consistent with iron deficiency.  He received 10 cycles of gemcitabine and Abraxane (05/04/2014 - 01/04/2015).  He developed a neuropathy with cycle #5 and thus Abraxane was decreased.  Because of ongoing symptomatic neuropathy, Abraxane was decreased with cycle #8.  He developed thrombocytopenia with cycle #6  and thus chemotherapy was switched to 2 weeks on/1 week off.  He last required a paracentesis on 01/26/2015.  CA19-9 initially decreased  then increased:  1322 on 05/04/2014, 738 on 06/01/2014, 365 on 06/29/2014, 157 on 07/27/2014, 176 on 09/21/2014, 148 on 10/19/2014, and 220 on 11/23/2014, 465 on 01/04/2015, 980 on 01/25/2015, and 1695 on 02/08/2015..  Abdominal and pelvic CT scan on 07/24/2014 revealed slight interval decrease in the posterior left hepatic lesion and in the bulkiness of the omental disease.  The portal vein was attenuated, but patent.  There was increased ascites.    Abdominal and pelvic CT scan on 10/26/2014 revealed reduced omental caking with the current indistinct tumor about 2.4 cm in thickness (previously 3.7 cm). He has ascites, a small right and trace left pleural effusion (improved).  There was a stable lesion in segment 4B of the liver. There was a stable hypointense enhancing mass in the body and tail of the pancreas. 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  Abdominal and pelvic CT scan from 01/01/2015 revealed increased ascites, but no substantial change in the right omental/mesenteric disease.  The mass in the pancreas was smaller.  There was interval development of a short segment of focal aortic dissection at the level of the IMA extending 3 cm in the craniocaudal length.  The posterior liver lesion was stable.  Chest CT angiogram on 01/26/2015 revealed no evidence of pulmonary embolism, small bilateral pleural effusions with bibasilar atelctasis, and a 2 cm density in the RLL representing round atelectasis.  He received gemcitabine alone on 02/01/2015.  Liver enzymes are elevated (stable).  Alkaline phosphatase has increased room 82 (normal) on 01/25/2015 to 588 on 02/20/2015.  Bilirubin had increased from 0.3 to 1.4.  Hepatitis B and C testing was negative on 02/01/2015.  Abdominal ultrasound on 02/15/2015 revealed borderline  common bile duct dilatation (8 mm) and large volume ascites.  The left hepatic lobe lesion was stable.  CA19-9 increased to 1695.  He began supportive care on 02/08/2015.  He was diagnosed with right sided Bell's palsy on 10/09/2014.  He has a grade II Abraxane neuropathy.   Symptomatically, he is slowly getting weaker.  He underwent therapeutic paracentesis (4.4 liters) on 03/08/2015.  He is breathing more comfortably.  Appetite is poor.  He has lost 15 pounds.  He is jaundiced.  He has progressive anemia.  He is on oral iron.  He denies an bleeding.  Code status is DNR/DNI.   Plan: 1.  Labs today: CBC with diff, BMP, folate, ferritin, iron studies, hold tube. 2.  Discuss progressive disease.  Discuss Hospice.  Patient's wife declines. 3.  Discuss anemia.  Offer transfusion for symptom relief.   4.  Discuss periodic therapeutic abdominal paracentesis for symptom relief. 5.  Discuss hydroxyzine 1/2 pill for pruritus. 6.  Encourage patient's wife to contact clinic if any concerns or needs. 7.  RTC in 3-4 weeks for MD assessment.   Lequita Asal, MD  03/15/2015, 3:52 PM

## 2015-03-17 ENCOUNTER — Encounter: Payer: Self-pay | Admitting: Hematology and Oncology

## 2015-03-17 DIAGNOSIS — R634 Abnormal weight loss: Secondary | ICD-10-CM | POA: Insufficient documentation

## 2015-03-19 ENCOUNTER — Telehealth: Payer: Self-pay | Admitting: *Deleted

## 2015-03-19 NOTE — Telephone Encounter (Signed)
Hospice referral sent.  

## 2015-03-19 NOTE — Telephone Encounter (Signed)
Called and said that they are ready for Hospice to come in

## 2015-03-19 NOTE — Telephone Encounter (Signed)
  Can we do a referral for Hospice?  M

## 2015-03-26 ENCOUNTER — Encounter: Payer: Self-pay | Admitting: Hematology and Oncology

## 2015-04-02 ENCOUNTER — Telehealth: Payer: Self-pay | Admitting: *Deleted

## 2015-04-02 ENCOUNTER — Encounter: Payer: Self-pay | Admitting: Hematology and Oncology

## 2015-04-02 DIAGNOSIS — C252 Malignant neoplasm of tail of pancreas: Secondary | ICD-10-CM

## 2015-04-02 DIAGNOSIS — R188 Other ascites: Secondary | ICD-10-CM

## 2015-04-02 NOTE — Telephone Encounter (Signed)
Orders entered per VO Dr Grayland Ormond, wife informed that scheduling will call her with date and time

## 2015-04-02 NOTE — Telephone Encounter (Signed)
Asking for [place] to schedule a paracentesis for Harry Roman

## 2015-04-04 ENCOUNTER — Ambulatory Visit
Admission: RE | Admit: 2015-04-04 | Discharge: 2015-04-04 | Disposition: A | Discharge status: Home / Self Care | Source: Ambulatory Visit | Attending: Oncology | Admitting: Oncology

## 2015-04-04 DIAGNOSIS — G893 Neoplasm related pain (acute) (chronic): Secondary | ICD-10-CM | POA: Diagnosis present

## 2015-04-04 DIAGNOSIS — C787 Secondary malignant neoplasm of liver and intrahepatic bile duct: Secondary | ICD-10-CM | POA: Diagnosis present

## 2015-04-04 DIAGNOSIS — C252 Malignant neoplasm of tail of pancreas: Secondary | ICD-10-CM

## 2015-04-04 DIAGNOSIS — Z9889 Other specified postprocedural states: Secondary | ICD-10-CM

## 2015-04-04 DIAGNOSIS — R188 Other ascites: Secondary | ICD-10-CM | POA: Insufficient documentation

## 2015-04-04 DIAGNOSIS — D638 Anemia in other chronic diseases classified elsewhere: Secondary | ICD-10-CM | POA: Diagnosis present

## 2015-04-04 DIAGNOSIS — K831 Obstruction of bile duct: Secondary | ICD-10-CM | POA: Diagnosis present

## 2015-04-04 DIAGNOSIS — R180 Malignant ascites: Secondary | ICD-10-CM | POA: Diagnosis not present

## 2015-04-04 DIAGNOSIS — Z79899 Other long term (current) drug therapy: Secondary | ICD-10-CM

## 2015-04-04 DIAGNOSIS — K561 Intussusception: Secondary | ICD-10-CM | POA: Diagnosis not present

## 2015-04-04 DIAGNOSIS — Z66 Do not resuscitate: Secondary | ICD-10-CM | POA: Diagnosis present

## 2015-04-04 DIAGNOSIS — Z515 Encounter for palliative care: Secondary | ICD-10-CM | POA: Diagnosis present

## 2015-04-04 DIAGNOSIS — C259 Malignant neoplasm of pancreas, unspecified: Secondary | ICD-10-CM | POA: Diagnosis present

## 2015-04-04 DIAGNOSIS — Z8249 Family history of ischemic heart disease and other diseases of the circulatory system: Secondary | ICD-10-CM

## 2015-04-04 DIAGNOSIS — E039 Hypothyroidism, unspecified: Secondary | ICD-10-CM | POA: Diagnosis present

## 2015-04-04 DIAGNOSIS — Z833 Family history of diabetes mellitus: Secondary | ICD-10-CM

## 2015-04-04 DIAGNOSIS — I248 Other forms of acute ischemic heart disease: Secondary | ICD-10-CM | POA: Diagnosis present

## 2015-04-04 DIAGNOSIS — E11649 Type 2 diabetes mellitus with hypoglycemia without coma: Secondary | ICD-10-CM | POA: Diagnosis present

## 2015-04-04 DIAGNOSIS — Z7982 Long term (current) use of aspirin: Secondary | ICD-10-CM

## 2015-04-04 DIAGNOSIS — Z794 Long term (current) use of insulin: Secondary | ICD-10-CM

## 2015-04-04 DIAGNOSIS — R627 Adult failure to thrive: Secondary | ICD-10-CM | POA: Diagnosis present

## 2015-04-04 DIAGNOSIS — I10 Essential (primary) hypertension: Secondary | ICD-10-CM | POA: Diagnosis present

## 2015-04-04 DIAGNOSIS — Z6825 Body mass index (BMI) 25.0-25.9, adult: Secondary | ICD-10-CM

## 2015-04-04 DIAGNOSIS — Z9221 Personal history of antineoplastic chemotherapy: Secondary | ICD-10-CM

## 2015-04-04 DIAGNOSIS — E46 Unspecified protein-calorie malnutrition: Secondary | ICD-10-CM | POA: Diagnosis present

## 2015-04-04 NOTE — Procedures (Signed)
Under US guidance, paracentesis was performed for therapeutic purposes. No immediate complications.

## 2015-04-06 ENCOUNTER — Telehealth: Payer: Self-pay | Admitting: *Deleted

## 2015-04-06 ENCOUNTER — Other Ambulatory Visit: Payer: Self-pay | Admitting: Hematology and Oncology

## 2015-04-06 NOTE — Telephone Encounter (Signed)
After discussing with Dr Mike Gip, I called and spoke with Ivin Booty the Hospice Triage nurse to request that Mr [facility] nurse Donella Stade Elgie Congo go out and evaluate the patient. She said she would contact Crystal and let her know Dr Mike Gip is requesting a visit be made

## 2015-04-06 NOTE — Telephone Encounter (Signed)
I spoke with Dr Bary Castilla who will give message to Dr Jamal Collin. Will not be able to do procedure before mid week next week. Dr Mike Gip notified and she suggests that pt go to ER if that uncomfortable and cannot wait until next week. I then spoke with Janith Lima who will speak with Mrs Demichael and give her the option to go to ER. Then Dr Bary Castilla called back adn said that he has texted Dr Jamal Collin and has added Mr Tur to be evaluated by Dr Jamal Collin Monday earll afternoon. I called Crystal back again adn informed her of this adn told her to let Mrs Starratt know to call Dr Jamal Collin early Monday to find appt time. She thanked me for calling

## 2015-04-06 NOTE — Telephone Encounter (Signed)
     Ref Range 3wk ago  4wk ago     Sodium 135 - 145 mmol/L 133 (L) 134 (L)    Potassium 3.5 - 5.1 mmol/L 3.8 3.4 (L

## 2015-04-06 NOTE — Telephone Encounter (Signed)
Check with Surgeon to see if he is willing to insert abd drainage cath for intermittent paracentesis. Call placed to Dr Jacalyn Lefevre office

## 2015-04-06 NOTE — Telephone Encounter (Signed)
Pt had 2.2 L fld removed on 2/15, but wife reports that his abd has filled back up and is very tight. Asking what needs to be done

## 2015-04-06 NOTE — Telephone Encounter (Addendum)
Harry Roman called me back and reported that he has not had BM since Monday, but also that his abd is so tight she cannot really palpate it. She has gone over bowel regimen with wife and pt, but she feels he really is accumulating ascites very rapidly. His abd girth measures 38 inches

## 2015-04-07 ENCOUNTER — Inpatient Hospital Stay
Admission: EM | Admit: 2015-04-07 | Discharge: 2015-04-10 | DRG: 947 | Attending: Internal Medicine | Admitting: Internal Medicine

## 2015-04-07 ENCOUNTER — Encounter: Payer: Self-pay | Admitting: Emergency Medicine

## 2015-04-07 ENCOUNTER — Emergency Department

## 2015-04-07 ENCOUNTER — Other Ambulatory Visit: Payer: Self-pay

## 2015-04-07 DIAGNOSIS — C787 Secondary malignant neoplasm of liver and intrahepatic bile duct: Secondary | ICD-10-CM | POA: Diagnosis present

## 2015-04-07 DIAGNOSIS — Z794 Long term (current) use of insulin: Secondary | ICD-10-CM | POA: Diagnosis not present

## 2015-04-07 DIAGNOSIS — R109 Unspecified abdominal pain: Secondary | ICD-10-CM | POA: Diagnosis present

## 2015-04-07 DIAGNOSIS — E11649 Type 2 diabetes mellitus with hypoglycemia without coma: Secondary | ICD-10-CM | POA: Diagnosis present

## 2015-04-07 DIAGNOSIS — G893 Neoplasm related pain (acute) (chronic): Secondary | ICD-10-CM | POA: Diagnosis present

## 2015-04-07 DIAGNOSIS — Z9221 Personal history of antineoplastic chemotherapy: Secondary | ICD-10-CM | POA: Diagnosis not present

## 2015-04-07 DIAGNOSIS — Z515 Encounter for palliative care: Secondary | ICD-10-CM | POA: Diagnosis present

## 2015-04-07 DIAGNOSIS — C252 Malignant neoplasm of tail of pancreas: Secondary | ICD-10-CM | POA: Diagnosis not present

## 2015-04-07 DIAGNOSIS — K56609 Unspecified intestinal obstruction, unspecified as to partial versus complete obstruction: Secondary | ICD-10-CM

## 2015-04-07 DIAGNOSIS — R180 Malignant ascites: Secondary | ICD-10-CM | POA: Diagnosis present

## 2015-04-07 DIAGNOSIS — E039 Hypothyroidism, unspecified: Secondary | ICD-10-CM | POA: Diagnosis present

## 2015-04-07 DIAGNOSIS — E46 Unspecified protein-calorie malnutrition: Secondary | ICD-10-CM | POA: Diagnosis present

## 2015-04-07 DIAGNOSIS — Z7982 Long term (current) use of aspirin: Secondary | ICD-10-CM | POA: Diagnosis not present

## 2015-04-07 DIAGNOSIS — K831 Obstruction of bile duct: Secondary | ICD-10-CM | POA: Diagnosis present

## 2015-04-07 DIAGNOSIS — Z79899 Other long term (current) drug therapy: Secondary | ICD-10-CM | POA: Diagnosis not present

## 2015-04-07 DIAGNOSIS — K561 Intussusception: Secondary | ICD-10-CM

## 2015-04-07 DIAGNOSIS — D638 Anemia in other chronic diseases classified elsewhere: Secondary | ICD-10-CM | POA: Diagnosis present

## 2015-04-07 DIAGNOSIS — I10 Essential (primary) hypertension: Secondary | ICD-10-CM | POA: Diagnosis present

## 2015-04-07 DIAGNOSIS — I314 Cardiac tamponade: Secondary | ICD-10-CM

## 2015-04-07 DIAGNOSIS — R627 Adult failure to thrive: Secondary | ICD-10-CM | POA: Diagnosis present

## 2015-04-07 DIAGNOSIS — Z6825 Body mass index (BMI) 25.0-25.9, adult: Secondary | ICD-10-CM | POA: Diagnosis not present

## 2015-04-07 DIAGNOSIS — Z9889 Other specified postprocedural states: Secondary | ICD-10-CM | POA: Diagnosis not present

## 2015-04-07 DIAGNOSIS — C259 Malignant neoplasm of pancreas, unspecified: Secondary | ICD-10-CM | POA: Diagnosis present

## 2015-04-07 DIAGNOSIS — Z833 Family history of diabetes mellitus: Secondary | ICD-10-CM | POA: Diagnosis not present

## 2015-04-07 DIAGNOSIS — Z66 Do not resuscitate: Secondary | ICD-10-CM | POA: Diagnosis present

## 2015-04-07 DIAGNOSIS — Z8249 Family history of ischemic heart disease and other diseases of the circulatory system: Secondary | ICD-10-CM | POA: Diagnosis not present

## 2015-04-07 DIAGNOSIS — I248 Other forms of acute ischemic heart disease: Secondary | ICD-10-CM | POA: Diagnosis present

## 2015-04-07 LAB — COMPREHENSIVE METABOLIC PANEL
ALBUMIN: 2.1 g/dL — AB (ref 3.5–5.0)
ALK PHOS: 258 U/L — AB (ref 38–126)
ALT: 27 U/L (ref 17–63)
ANION GAP: 7 (ref 5–15)
AST: 44 U/L — AB (ref 15–41)
BILIRUBIN TOTAL: 2.7 mg/dL — AB (ref 0.3–1.2)
BUN: 24 mg/dL — AB (ref 6–20)
CALCIUM: 8.1 mg/dL — AB (ref 8.9–10.3)
CO2: 22 mmol/L (ref 22–32)
Chloride: 106 mmol/L (ref 101–111)
Creatinine, Ser: 0.82 mg/dL (ref 0.61–1.24)
GFR calc Af Amer: >60 mL/min (ref >60)
GLUCOSE: 90 mg/dL (ref 65–99)
Potassium: 4.4 mmol/L (ref 3.5–5.1)
Sodium: 135 mmol/L (ref 135–145)
TOTAL PROTEIN: 7.7 g/dL (ref 6.5–8.1)

## 2015-04-07 LAB — URINALYSIS COMPLETE WITH MICROSCOPIC (ARMC ONLY)
BILIRUBIN URINE: NEGATIVE
GLUCOSE, UA: NEGATIVE mg/dL
HGB URINE DIPSTICK: NEGATIVE
LEUKOCYTES UA: NEGATIVE
NITRITE: NEGATIVE
PH: 5 (ref 5.0–8.0)
Protein, ur: NEGATIVE mg/dL
SPECIFIC GRAVITY, URINE: 1.046 — AB (ref 1.005–1.030)
Squamous Epithelial / LPF: NONE SEEN

## 2015-04-07 LAB — CBC
HCT: 31.3 % — ABNORMAL LOW (ref 40.0–52.0)
Hemoglobin: 10.4 g/dL — ABNORMAL LOW (ref 13.0–18.0)
MCH: 26.4 pg (ref 26.0–34.0)
MCHC: 33.2 g/dL (ref 32.0–36.0)
MCV: 79.4 fL — ABNORMAL LOW (ref 80.0–100.0)
Platelets: 324 10*3/uL (ref 150–440)
RBC: 3.95 MIL/uL — ABNORMAL LOW (ref 4.40–5.90)
RDW: 20.6 % — AB (ref 11.5–14.5)
WBC: 7.1 10*3/uL (ref 3.8–10.6)

## 2015-04-07 LAB — GLUCOSE, CAPILLARY
GLUCOSE-CAPILLARY: 74 mg/dL (ref 65–99)
Glucose-Capillary: 81 mg/dL (ref 65–99)
Glucose-Capillary: 99 mg/dL (ref 65–99)

## 2015-04-07 LAB — LIPASE, BLOOD: Lipase: 13 U/L (ref 11–51)

## 2015-04-07 LAB — LACTIC ACID, PLASMA
Lactic Acid, Venous: 1.6 mmol/L (ref 0.5–2.0)
Lactic Acid, Venous: 1.6 mmol/L (ref 0.5–2.0)

## 2015-04-07 LAB — TROPONIN I: Troponin I: 0.1 ng/mL — ABNORMAL HIGH (ref <0.031)

## 2015-04-07 MED ORDER — SENNOSIDES-DOCUSATE SODIUM 8.6-50 MG PO TABS: 1.0000 | ORAL_TABLET | Freq: Every evening | ORAL | Status: DC | PRN

## 2015-04-07 MED ORDER — INSULIN ASPART 100 UNIT/ML ~~LOC~~ SOLN: 0.0000 [IU] | Freq: Three times a day (TID) | SUBCUTANEOUS | Status: DC

## 2015-04-07 MED ORDER — FERROUS SULFATE 325 (65 FE) MG PO TABS
325.0000 mg | ORAL_TABLET | Freq: Every day | ORAL | Status: DC
  Filled 2015-04-07: qty 1

## 2015-04-07 MED ORDER — DOCUSATE SODIUM 100 MG PO CAPS
100.0000 mg | ORAL_CAPSULE | Freq: Two times a day (BID) | ORAL | Status: DC
  Administered 2015-04-07 – 2015-04-08 (×2): 100 mg via ORAL
  Filled 2015-04-07 (×3): qty 1

## 2015-04-07 MED ORDER — ONDANSETRON HCL 4 MG/2ML IJ SOLN
4.0000 mg | Freq: Four times a day (QID) | INTRAMUSCULAR | Status: DC | PRN
  Administered 2015-04-07: 4 mg via INTRAVENOUS
  Filled 2015-04-07: qty 2

## 2015-04-07 MED ORDER — ACETAMINOPHEN 650 MG RE SUPP: 650.0000 mg | Freq: Four times a day (QID) | RECTAL | Status: DC | PRN

## 2015-04-07 MED ORDER — HYDROCODONE-ACETAMINOPHEN 5-325 MG PO TABS: 1.0000 | ORAL_TABLET | ORAL | Status: DC | PRN

## 2015-04-07 MED ORDER — SODIUM CHLORIDE 0.9 % IV SOLN
Freq: Once | INTRAVENOUS | Status: AC
  Administered 2015-04-07: 08:00:00 via INTRAVENOUS

## 2015-04-07 MED ORDER — ACETAMINOPHEN 325 MG PO TABS
650.0000 mg | ORAL_TABLET | Freq: Four times a day (QID) | ORAL | Status: DC | PRN
Start: 2015-04-07 — End: 2015-04-10

## 2015-04-07 MED ORDER — ASPIRIN 81 MG PO CHEW
81.0000 mg | CHEWABLE_TABLET | Freq: Every day | ORAL | Status: DC
  Administered 2015-04-07 – 2015-04-08 (×2): 81 mg via ORAL
  Filled 2015-04-07 (×2): qty 1

## 2015-04-07 MED ORDER — CARVEDILOL 6.25 MG PO TABS
12.5000 mg | ORAL_TABLET | Freq: Two times a day (BID) | ORAL | Status: DC
  Administered 2015-04-07 – 2015-04-08 (×2): 12.5 mg via ORAL
  Filled 2015-04-07 (×2): qty 2

## 2015-04-07 MED ORDER — HEPARIN SODIUM (PORCINE) 5000 UNIT/ML IJ SOLN
5000.0000 [IU] | Freq: Three times a day (TID) | INTRAMUSCULAR | Status: DC
  Administered 2015-04-07 – 2015-04-09 (×7): 5000 [IU] via SUBCUTANEOUS
  Filled 2015-04-07 (×7): qty 1

## 2015-04-07 MED ORDER — IOHEXOL 300 MG/ML  SOLN
100.0000 mL | Freq: Once | INTRAMUSCULAR | Status: AC | PRN
  Administered 2015-04-07: 100 mL via INTRAVENOUS

## 2015-04-07 MED ORDER — SODIUM CHLORIDE 0.9 % IV SOLN
INTRAVENOUS | Status: DC
  Administered 2015-04-07 – 2015-04-09 (×4): via INTRAVENOUS

## 2015-04-07 MED ORDER — ONDANSETRON HCL 4 MG/2ML IJ SOLN
4.0000 mg | Freq: Once | INTRAMUSCULAR | Status: AC
  Administered 2015-04-07: 4 mg via INTRAVENOUS

## 2015-04-07 MED ORDER — ASPIRIN 81 MG PO CHEW
324.0000 mg | CHEWABLE_TABLET | Freq: Once | ORAL | Status: AC
  Administered 2015-04-07: 324 mg via ORAL
  Filled 2015-04-07: qty 4

## 2015-04-07 MED ORDER — POLYETHYLENE GLYCOL 3350 17 G PO PACK
17.0000 g | PACK | Freq: Every day | ORAL | Status: DC
  Administered 2015-04-07 – 2015-04-08 (×2): 17 g via ORAL
  Filled 2015-04-07 (×2): qty 1

## 2015-04-07 MED ORDER — ONDANSETRON HCL 4 MG/2ML IJ SOLN
INTRAMUSCULAR | Status: AC
  Filled 2015-04-07: qty 2

## 2015-04-07 MED ORDER — ONDANSETRON HCL 4 MG PO TABS: 4.0000 mg | ORAL_TABLET | Freq: Four times a day (QID) | ORAL | Status: DC | PRN

## 2015-04-07 MED ORDER — MORPHINE SULFATE (PF) 2 MG/ML IV SOLN
1.0000 mg | INTRAVENOUS | Status: DC | PRN
  Administered 2015-04-07: 1 mg via INTRAVENOUS
  Filled 2015-04-07: qty 1

## 2015-04-07 MED ORDER — OXYCODONE HCL 5 MG PO TABS
5.0000 mg | ORAL_TABLET | Freq: Four times a day (QID) | ORAL | Status: DC | PRN
  Administered 2015-04-07: 15:00:00 5 mg via ORAL
  Filled 2015-04-07: qty 1

## 2015-04-07 MED ORDER — NYSTATIN 100000 UNIT/GM EX POWD
1.0000 | Freq: Three times a day (TID) | CUTANEOUS | Status: DC
  Administered 2015-04-07 – 2015-04-09 (×5): 1 via TOPICAL
  Filled 2015-04-07 (×14): qty 15

## 2015-04-07 MED ORDER — BISACODYL 10 MG RE SUPP: 10.0000 mg | Freq: Every day | RECTAL | Status: DC | PRN

## 2015-04-07 MED ORDER — NYSTATIN 100000 UNIT/ML MT SUSP
5.0000 mL | Freq: Three times a day (TID) | OROMUCOSAL | Status: DC
  Administered 2015-04-07 – 2015-04-09 (×7): 500000 [IU] via ORAL
  Filled 2015-04-07 (×7): qty 5

## 2015-04-07 MED ORDER — IOHEXOL 240 MG/ML SOLN
25.0000 mL | Freq: Once | INTRAMUSCULAR | Status: AC | PRN
  Administered 2015-04-07: 25 mL via ORAL

## 2015-04-07 NOTE — ED Notes (Signed)
Abdominal pain x , no BM x 1 week. Pt weak and lethargic in triage.

## 2015-04-07 NOTE — Progress Notes (Signed)
CT reviewed. Obstruction likely secondary to metastatic deposits involving the small bowel.  Extremely poor candidate for laparotomy for bypass/ resection. Would consider comfort care only vs decompression w/ NG in hopes of spontaneous resolution (unlikely).

## 2015-04-07 NOTE — ED Provider Notes (Signed)
[facility] Emergency Department Provider Note  ____________________________________________  Time seen: Approximately 8:11 AM  I have reviewed the triage vital signs and the nursing notes.   HISTORY  Chief Complaint Abdominal Pain    HPI Harry Roman is a 70 y.o. male was a history of pancreatic cancer and having abdominal pain for about a month but it's worse now. He had ascitic fluid drawn off from his abdomen on the 15th 3 days ago the belly has increased in size since and is now more tender. He has not been able to have a bowel movement for about a week. Patient is nauseated and unable to keep anything down at present. Patient feels weak and dry denies any fever.  Past Medical History  Diagnosis Date  . Hypertension   . Diabetes mellitus without complication ([place])   . Hyperlipidemia   . Ascites 2016  . Neuropathy ([place]) 09/30/2014  . Hypothyroidism   . Cancer ([place]) 2016    pancreas  . 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 Weight loss 03/17/2015  . Alkaline phosphatase elevation 02/08/2015  . Bell's palsy 10/09/2014  . Facial droop 10/07/2014  . DM (diabetes mellitus) ([place]) 10/07/2014  . Neuropathy ([place]) 09/30/2014  . Hypomagnesemia 08/24/2014  . Ascites 07/27/2014  . Hypokalemia 07/27/2014  . Cancer ([place]) 07/13/2014  . Bilateral lower extremity edema 07/13/2014  . B12 deficiency 06/29/2014  . 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[place]) 05/22/2014  . Hypothyroidism 05/22/2014  . Hypertension 05/22/2014  . Hyperlipemia 05/22/2014  . Diabetes mellitus, type 2 ([place]) 05/22/2014    Past Surgical History  Procedure Laterality Date  . Hemorrhoid surgery    . Colonoscopy  Jan 2015    Dr Donnella Sham  . Peritoneal fluid aspiration  2016    Current Outpatient Rx  Name  Route  Sig  Dispense  Refill  . aspirin 81 MG tablet   Oral   Take 81 mg by mouth daily.          . carvedilol (COREG) 12.5 MG tablet   Oral   Take 12.5 mg by mouth 2 (two) times daily with a meal.         . enalapril (VASOTEC) 20 MG tablet   Oral   Take 30 mg by mouth daily.          . ferrous sulfate 325 (65 FE) MG tablet   Oral   Take 325 mg by mouth daily with breakfast.         . gabapentin (NEURONTIN) 100 MG capsule   Oral   Take 2 capsules (200 mg total) by mouth at bedtime.   60 capsule   0   . hydrOXYzine (VISTARIL) 25 MG capsule      Take 0.5 - 1 tab (12.5mg  - 25 mg) every 6 hours as needed for itching   30 capsule   1   . insulin regular (NOVOLIN R,HUMULIN R) 100 units/mL injection      Sliding Scale  Glucose 200-300 take 2 units Glucose 301-400 take 4 units  Glucose 401-500 take 6 units   10 mL   1   . KLOR-CON M20 20 MEQ tablet   Oral   Take 1 tablet (20 mEq total) by mouth 2 (two) times daily.   180 tablet   0     Dispense as written.   Marland Kitchen KLOR-CON M20 20 MEQ tablet  Take 1 tablet by mouth two  times daily   180 tablet   0     Dispense as written.   Marland Kitchen levothyroxine (SYNTHROID, LEVOTHROID) 50 MCG tablet   Oral   Take 50 mcg by mouth daily before breakfast. Reported on 04/04/2015         . linagliptin (TRADJENTA) 5 MG TABS tablet   Oral   Take 5 mg by mouth daily.         Marland Kitchen lovastatin (MEVACOR) 10 MG tablet   Oral   Take 10 mg by mouth at bedtime. Reported on 04/04/2015         . oxyCODONE-acetaminophen (ROXICET) 5-325 MG tablet   Oral   Take 1 tablet by mouth every 6 (six) hours as needed for severe pain.   10 tablet   0   . RELION ULTIMA TEST test strip                 Dispense as written.     Allergies Review of patient's allergies indicates no known allergies.  Family History  Problem Relation Age of Onset  . Hypertension Mother   . Diabetes Mother   . Diabetes Sister   . Diabetes Brother     Social History Social History  Substance Use Topics  . Smoking status: Never Smoker   . Smokeless  tobacco: None  . Alcohol Use: No    Review of Systems Constitutional: No fever/chills Eyes: No visual changes. ENT: No sore throat. Cardiovascular: Denies chest pain. Respiratory: Denies shortness of breath. Gastrointestinal abdominal pain.   nausea,  vomiting.  No diarrhea.   constipation. Genitourinary: Negative for dysuria. Musculoskeletal: Negative for back pain. Skin: Negative for rash. Neurological: Negative for headaches, focal weakness or numbness.  10-point ROS otherwise negative.  ____________________________________________   PHYSICAL EXAM:  VITAL SIGNS: ED Triage Vitals  Enc Vitals Group     BP 04/07/15 0717 151/85 mmHg     Pulse Rate 04/07/15 0717 84     Resp 04/07/15 0717 20     Temp 04/07/15 0717 98.5 F (36.9 C)     Temp Source 04/07/15 0717 Oral     SpO2 04/07/15 0718 98 %     Weight 04/07/15 0718 134 lb (60.782 kg)     Height 04/07/15 0718 5\' 7"  (1.702 m)     Head Cir --      Peak Flow --      Pain Score 04/07/15 0718 8     Pain Loc --      Pain Edu? --      Excl. in [place]? --     Constitutional: Alert and oriented. Cachectic looking Eyes: Conjunctivae are slightly jaundiced. PERRL. EOMI. Head: Atraumatic. Nose: No congestion/rhinnorhea. Mouth/Throat: Mucous membranes are dry.  Oropharynx non-erythematous. Neck: No stridor.   Cardiovascular: Normal rate, regular rhythm. Grossly normal heart sounds.  Good peripheral circulation. Respiratory: Normal respiratory effort.  No retractions. Lungs CTAB. Gastrointestinal: Firm but not rigid diffusely tender to palpation and mild percussion. High pitched tinkly bowel sounds. No abdominal bruits. No CVA tenderness. }Musculoskeletal: No lower extremity tenderness nor edema.  No joint effusions. Neurologic:  Normal speech and language. No gross focal neurologic deficits are appreciated. No gait instability. Skin:  Skin is warm, dry and intact. No rash noted.   ____________________________________________    LABS (all labs ordered are listed, but only abnormal results are displayed)  Labs Reviewed  COMPREHENSIVE METABOLIC PANEL - Abnormal; Notable for the following:  BUN 24 (*)    Calcium 8.1 (*)    Albumin 2.1 (*)    AST 44 (*)    Alkaline Phosphatase 258 (*)    Total Bilirubin 2.7 (*)    All other components within normal limits  CBC - Abnormal; Notable for the following:    RBC 3.95 (*)    Hemoglobin 10.4 (*)    HCT 31.3 (*)    MCV 79.4 (*)    RDW 20.6 (*)    All other components within normal limits  TROPONIN I - Abnormal; Notable for the following:    Troponin I 0.10 (*)    All other components within normal limits  GLUCOSE, CAPILLARY  LIPASE, BLOOD  LACTIC ACID, PLASMA  URINALYSIS COMPLETEWITH MICROSCOPIC ([facility] ONLY)  LACTIC ACID, PLASMA   ____________________________________________  EKG  EKG read and interpreted by me shows left bundle branch block with first-degree AV block left axis T-wave inversions in 1 and L are new since November of last year T waves in the chest leads especially V2 and 3 are much more high peaked than previously as well. ____________________________________________  RADIOLOGY  CT scan of the abdomen shows a number of conditions including small bowel obstruction and intussusception increased metastatic disease which may be contributing to the intussusception loculated ascites increased cancer with increased tumor size and metastasis dilated bile ducts compression of the anterior wall of the right ventricle from the pericardial effusion consistent with early Benign. ____________________________________________   PROCEDURES  Discussed patient with Dr. Tollie Pizza who is on-call for his surgeon Dr. Cleopatra Cedar car Dr. Tollie Pizza says the patient is not a surgical candidate. We'll admit to medicine ____________________________________________   INITIAL IMPRESSION / ASSESSMENT AND PLAN / ED COURSE  Pertinent labs & imaging results that were available  during my care of the patient were reviewed by me and considered in my medical decision making (see chart for details).  Patient is also at risk for spontaneous bacterial peritonitis with the way his belly is diffusely tender even to light touch in gentle palpation and gentle percussion he also had the procedure done with the ascites being drained off. ____________________________________________   FINAL CLINICAL IMPRESSION(S) / ED DIAGNOSES  Final diagnoses:  Pericardial tamponade  Small bowel obstruction (HCC)  Intussusception (HCC)  Abdominal pain, unspecified abdominal location      Nena Polio, MD 04/07/15 1122

## 2015-04-07 NOTE — ED Notes (Signed)
Elevated troponin verbally reported to Dr Cinda Quest. 0.10

## 2015-04-07 NOTE — ED Notes (Signed)
Report given to Angela, RN.

## 2015-04-07 NOTE — ED Notes (Signed)
Spoke with Dr. Posey Pronto to be sure that she saw patients troponin and that see if patient needed to be on off unit telemetry. Per Dr. Posey Pronto she is aware of elevated troponin and patient does not need telemetry monitoring.

## 2015-04-07 NOTE — H&P (Signed)
[place] [facility] [place] NAME: Harry Roman    MR#:  [id number]  DATE OF BIRTH:  Oct 17, 1945  DATE OF ADMISSION:  04/07/2015  PRIMARY CARE PHYSICIAN: Marguerita Merles, MD   REQUESTING/REFERRING PHYSICIAN: Dr. Cinda Quest  CHIEF COMPLAINT:  Abdominal pain no bowel movement for one week.  HISTORY OF PRESENT ILLNESS:  Harry Roman  is a 70 y.o. 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 Patient appears very weak. According to the wife patient has not been able to keep much orally. He has been trying to drink Ensure drinks at home however did vomit yesterday 1. In the emergency room patient appears to be hemodynamically stable however he appears to be frail and seems to be a poor historian. Denies any abdominal pain at present. His bili is quite distended and tight. CT abdomen done in the emergency room shows progressive worsening liver metastasis with biliary obstruction and small bowel obstruction likely due to combination of intussusception and peritoneal metastatic disease in the inferior right pelvis. Extensive amount of pain due to metastatic disease with moderate amount of malignant ascites noted. ER physician spoke with Dr. Bary Castilla, surgery and after reviewing CT scan he recommends patient has not any surgical candidate and recommended for medicine admission.   PAST MEDICAL HISTORY:   Past Medical History  Diagnosis Date  . Hypertension   . Diabetes mellitus without complication ([place])   . Hyperlipidemia   . Ascites 2016  . Neuropathy ([place]) 09/30/2014  . Hypothyroidism   . Cancer ([place]) 2016    pancreas  .  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[place]) 05/22/2014    PAST SURGICAL HISTOIRY:   Past Surgical History  Procedure Laterality Date  . Hemorrhoid surgery    . Colonoscopy  Jan 2015    Dr Donnella Sham  . Peritoneal fluid aspiration  2016    SOCIAL HISTORY:   Social History  Substance Use Topics  . Smoking status: Never Smoker   . Smokeless tobacco: Not on file  . Alcohol Use: No    FAMILY HISTORY:   Family History  Problem Relation Age of Onset  . Hypertension Mother   . Diabetes Mother   . Diabetes Sister   . Diabetes Brother     DRUG ALLERGIES:  No Known Allergies  REVIEW OF SYSTEMS:  Review of Systems  Constitutional: Positive for weight loss and malaise/fatigue. Negative for fever and chills.  HENT: Negative for ear discharge, ear pain and nosebleeds.   Eyes: Negative for blurred vision, pain and discharge.  Respiratory: Negative for sputum production, shortness of breath, wheezing and stridor.   Cardiovascular: Negative for chest pain, palpitations, orthopnea and PND.  Gastrointestinal: Positive for nausea, vomiting and abdominal pain. Negative for diarrhea.  Genitourinary: Negative for urgency and frequency.  Musculoskeletal: Positive for back pain. Negative for joint pain.  Neurological: Positive for weakness. Negative for sensory change, speech change and focal weakness.  Psychiatric/Behavioral: Negative for depression and hallucinations. The patient is not nervous/anxious.      MEDICATIONS AT HOME:   Prior to Admission medications   Medication Sig Start Date End Date Taking? Authorizing Provider  aspirin 81 MG tablet Take 81 mg by mouth daily.    Historical  Provider, MD  carvedilol (COREG) 12.5 MG tablet Take 12.5 mg by mouth 2 (two) times daily with a meal.    Historical Provider, MD  enalapril (VASOTEC) 20 MG tablet Take 30 mg by mouth daily.     Historical Provider, MD  ferrous sulfate 325 (65 FE) MG tablet Take 325 mg by mouth daily with breakfast.     Historical Provider, MD  gabapentin (NEURONTIN) 100 MG capsule Take 2 capsules (200 mg total) by mouth at bedtime. 01/29/15   Lequita Asal, MD  hydrOXYzine (VISTARIL) 25 MG capsule Take 0.5 - 1 tab (12.5mg  - 25 mg) every 6 hours as needed for itching 03/08/15   Lequita Asal, MD  insulin regular (NOVOLIN R,HUMULIN R) 100 units/mL injection Sliding Scale  Glucose 200-300 take 2 units Glucose 301-400 take 4 units  Glucose 401-500 take 6 units 08/25/14 08/25/15  Lisa Roca, MD  KLOR-CON M20 20 MEQ tablet Take 1 tablet (20 mEq total) by mouth 2 (two) times daily. 01/29/15   Lequita Asal, MD  KLOR-CON M20 20 MEQ tablet Take 1 tablet by mouth two  times daily 04/06/15   Lequita Asal, MD  levothyroxine (SYNTHROID, LEVOTHROID) 50 MCG tablet Take 50 mcg by mouth daily before breakfast. Reported on 04/04/2015    Historical Provider, MD  linagliptin (TRADJENTA) 5 MG TABS tablet Take 5 mg by mouth daily.    Historical Provider, MD  lovastatin (MEVACOR) 10 MG tablet Take 10 mg by mouth at bedtime. Reported on 04/04/2015    Historical Provider, MD  oxyCODONE-acetaminophen (ROXICET) 5-325 MG tablet Take 1 tablet by mouth every 6 (six) hours as needed for severe pain. 12/07/14   Nance Pear, MD  RELION ULTIMA TEST test strip  11/19/14   Historical Provider, MD      VITAL SIGNS:  Blood pressure 134/91, pulse 78, temperature 98.5 F (36.9 C), temperature source Oral, resp. rate 25, height 5\' 7"  (1.702 m), weight 60.782 kg (134 lb), SpO2 95 %.  PHYSICAL EXAMINATION:  GENERAL:  70 y.o.-year-old patient lying in the bed with no acute distress. Appears critically ill severely cachectic malnourished EYES: Pupils equal, round, reactive to light and accommodation. Positive scleral icterus. Extraocular muscles intact.  HEENT: Head atraumatic, normocephalic. Oropharynx and nasopharynx clear.  NECK:  Supple, no jugular venous distention. No thyroid enlargement, no tenderness.  LUNGS: Normal breath  sounds bilaterally, no wheezing, rales,rhonchi or crepitation. No use of accessory muscles of respiration.  CARDIOVASCULAR: S1, S2 normal. No murmurs, rubs, or gallops.  ABDOMEN: Severely distended with malignant ascites. Bowel sounds present. Unable to palpate any organomegaly or mass.  EXTREMITIES: No pedal edema, cyanosis, or clubbing.  NEUROLOGIC: Cranial nerves II through XII are intact.  Sensation intact. Gait not checked. Subjective weakness. Very fair frail and cachectuc PSYCHIATRIC: The patient is alert and oriented x 2 SKIN: No obvious rash, lesion, or ulcer.   LABORATORY PANEL:   CBC  Recent Labs Lab 04/07/15 0741  WBC 7.1  HGB 10.4*  HCT 31.3*  PLT 324   ------------------------------------------------------------------------------------------------------------------  Chemistries   Recent Labs Lab 04/07/15 0741  NA 135  K 4.4  CL 106  CO2 22  GLUCOSE 90  BUN 24*  CREATININE 0.82  CALCIUM 8.1*  AST 44*  ALT 27  ALKPHOS 258*  BILITOT 2.7*   ------------------------------------------------------------------------------------------------------------------  Cardiac Enzymes  Recent Labs Lab 04/07/15 0813  TROPONINI 0.10*   ------------------------------------------------------------------------------------------------------------------  RADIOLOGY:  Ct Abdomen Pelvis W Contrast  04/07/2015  CLINICAL DATA:  Increase  abdomen and pelvic pain and distension. Status post paracentesis on 04/04/2015, yielding 2.2 L of fluid. Pancreatic cancer. EXAM: CT ABDOMEN AND PELVIS WITH CONTRAST TECHNIQUE: Multidetector CT imaging of the abdomen and pelvis was performed using the standard protocol following bolus administration of intravenous contrast. CONTRAST:  9mL OMNIPAQUE IOHEXOL 240 MG/ML SOLN, 164mL OMNIPAQUE IOHEXOL 300 MG/ML SOLN COMPARISON:  Previous examinations, the most recent dated 01/01/2015. FINDINGS: Lower chest: Increased size of small bilateral pleural  effusions. Mildly increased right lower lobe atelectasis and interval mild left lower lobe atelectasis. Dilated distal esophagus containing oral contrast. Increased size of a pericardial effusion, currently measuring 18 mm in maximum thickness and compressing the anterior aspect of the right ventricle. Hepatobiliary: The liver is compressed bottom loculated ascites lateral an anterior to the liver, with associated angulation of the liver margins. There is a heterogeneous mass in the anterior segment of the right lobe of the liver, measuring 6.2 x 5.3 cm on image number 24. This previously measured 2.9 x 2.2 cm. This is contiguous or nearly contiguous with a heterogeneous mass more inferiorly in the posterior segment of the right lobe, measuring approximately 5.1 x 4.6 cm on image number 34. There is an interval heterogeneous mass in the posterior segment of the right lobe of the liver, measuring 5.0 x 4.4 cm on image number 29. Progressive intrahepatic biliary ductal dilatation, especially in the left lobe. This is due to a poorly defined mass in the inferior aspect of the left lobe, obstructing the intrahepatic ducts. There is also a poorly defined mass in the caudate lobe. Unremarkable gallbladder. Pancreas: The previously demonstrated 4.8 x 2.1 cm oval, hypoechoic mass in the tail of the pancreas coming containing intraductal calculi a, currently measures approximately 5.3 x 1.7 cm in corresponding dimensions. The remainder of the pancreas is atrophied, with progression. Spleen: Within normal limits in size and appearance. Adrenals/Urinary Tract: The kidneys are posteriorly displaced. There is mild to moderate dilatation of the left renal collecting system and proximal ureter with delayed excretion of contrast. No hydronephrosis is seen on the right. No calculi. Poorly distended urinary bladder. Stomach/Bowel: Multiple markedly dilated small bowel loops containing fluid, gas and feculent material. This  transitions tube in normal caliber bowel in the inferior right pelvis, whether there is some intussusception. The colon is normal in caliber. Multiple sigmoid colon diverticula are noted. The stomach contains ingested material without significant dilatation. Vascular/Lymphatic: Mild atheromatous arterial calcifications. No enlarged lymph nodes identified. Reproductive: Mildly enlarged prostate gland. Other: Moderate amount of upper abdominal ascites. This is at least partially loculated, with some compression of the liver. There is also extensive peritoneal tumor implants in the abdomen and pelvis. A right inguinal hernia contains fat and fluid. Musculoskeletal: Lumbar spine degenerative changes. Lipoma posterior to the right acetabulum. IMPRESSION: 1. Progressive liver metastatic disease with biliary obstruction in the left lobe. 2. Small bowel obstruction, most likely due to a combination of intussusception and peritoneal metastatic disease in the inferior right pelvis. 3. Extensive peritoneal metastatic disease with a moderate amount of partially loculated, malignant ascites in the upper abdomen. 4. Mild to moderate left hydronephrosis and proximal hydroureter, most likely due to compression of the mid ureter by adjacent dilated bowel. 5. Increased size of small bilateral pleural effusions. 6. Increased size of a moderate-sized pericardial effusion with some right ventricular compression. 7. Bilateral lower lobe atelectasis. 8. Dilated esophagus containing oral contrast, possibly representing refluxed contrast. 9. Right inguinal hernia containing fat and fluid. 10. Little change in  the previously seen mass in the tail of the pancreas. Electronically Signed   By: Claudie Revering M.D.   On: 04/07/2015 10:11   Dg Chest Portable 1 View  04/07/2015  CLINICAL DATA:  Weakness and lethargy; abdominal pain and no bm x 1 week; abdominal paracentesis 2-17; hx pancreatic cancer, diabetes, and htn; nonsmoker EXAM: PORTABLE  CHEST 1 VIEW COMPARISON:  12/23/2014.  CT, 01/29/2015. FINDINGS: Cardiac silhouette is mildly enlarged. Normal mediastinal and hilar contours. Opacity at the medial right lung base is stable. No new lung opacities. No pleural effusion or pneumothorax. Right anterior chest wall Port-A-Cath is stable. Skeletal structures are unremarkable. IMPRESSION: 1. No acute cardiopulmonary disease. Stable appearance from the prior study. Electronically Signed   By: Lajean Manes M.D.   On: 04/07/2015 08:43    EKG:   Sinus rhythm with first-degree AV block and possible left atrial enlargement. Left axis deviation. Left bundle branch block. IMPRESSION AND PLAN:  Harry Roman  is a 70 y.o. 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  1. Abdominal distention, abdominal pain suspected due to tightness from malignant ascites and or constipation. Admit patient to oncology -IV fluids -We'll give bowel prep to see if patient has a bowel movement -Surgical consultation with Dr. Tollie Pizza. The patient is scheduled to have a peritoneal catheter placed on Monday by Dr. Jamal Collin according to patient's wife. We will await surgical input for catheter placement versus paracentesis -When necessary morphine, Percocet for pain  2. Metastatic malignant pancreatic adenocarcinoma -Patient is under hospice care at home -CODE STATUS discussed with patient and patient's wife patient is a DO NOT RESUSCITATE. Consider -Consider oncology consultation if needed -Patient currently is not getting any chemotherapy   3. Abdominal CT scan showing possible small bowel obstruction with intussusception which could be  secondary to severe peritoneal metastatic lesions  -According to surgery not a surgical candidate  -Conservative management    4. Severe malnourishment/protein calorie malnutrition secondary to metastatic cancer   5. History of diabetes sugars are stable continue sliding scale  6. Hypertension blood pressure is stable  7. DVT prophylaxis subcutaneous heparin  Patient overall carries a poor prognosis given his metastatic cancer. Wife understands the prognosis. Care anagement for discharge planning.    All the records are reviewed and case discussed with ED provider. Management plans discussed with the patient, family and they are in agreement.  CODE STATUS: DNR  TOTAL TIME TAKING CARE OF THIS PATIENT: 50inutes.    Harry Roman M.D on 04/07/2015 at 11:38 AM  Between 7am to 6pm - Pager - [phone]  After 6pm go to [email or web address] - password [email or web address] [facility]  Office  [phone]  CC: Primary care physician; Marguerita Merles, MD

## 2015-04-08 DIAGNOSIS — C252 Malignant neoplasm of tail of pancreas: Secondary | ICD-10-CM

## 2015-04-08 LAB — GLUCOSE, CAPILLARY
GLUCOSE-CAPILLARY: 76 mg/dL (ref 65–99)
GLUCOSE-CAPILLARY: 75 mg/dL (ref 65–99)
GLUCOSE-CAPILLARY: 70 mg/dL (ref 65–99)
Glucose-Capillary: 79 mg/dL (ref 65–99)
Glucose-Capillary: 75 mg/dL (ref 65–99)

## 2015-04-08 MED ORDER — BUTAMBEN-TETRACAINE-BENZOCAINE 2-2-14 % EX AERO
3.0000 | INHALATION_SPRAY | Freq: Once | CUTANEOUS | Status: AC
  Administered 2015-04-08: 3 via TOPICAL
  Filled 2015-04-08: qty 20

## 2015-04-08 NOTE — Progress Notes (Signed)
PT Cancellation Note  Patient Details Name: Harry Roman MRN: [id number] DOB: May 05, 1945   Cancelled Treatment:    Reason Eval/Treat Not Completed: Fatigue/lethargy limiting ability to participate. Pt received asleep in bed, Wife, Son, and RN in room. Family reports that pt is very weak and lethargic and likely will not do well with PT evaluation at this time. They report baseline function status 6 months ago as household distances only c RW, followed by rapid progressive decline, requiring heavy physical assistance with all activity in the past month. Granted this patient's documented complexity of illness and poor prognosis, I do not anticipate any sudden or rapid resolution of his impairments, but suspect he will require heavy physical assistance at DC. Family asserts that they are unable to provide this much physical assistance and are open to considering placement options. I explained that PT services can benefit the patient in many different regardless of prognosis, but it will be helpful to get more information after PMT has met with pt and family to map out care/treatment decisions. Will continue to monitor remotely and attempt PT evaluation again when/if congruent with plan of care.   2:36 PM, 04/08/2015 Etta Grandchild, PT, DPT PRN Physical Therapist - [place] License # [id number] [phone] (mobile)

## 2015-04-08 NOTE — Progress Notes (Signed)
Patient ID: Harry Roman, male   DOB: Oct 07, 1945, 70 y.o.   MRN: [id number] [facility] PROGRESS NOTE  Harry Roman [id number] DOB: Sep 28, 1945 DOA: 04/07/2015 PCP: Marguerita Merles, MD  HPI/Subjective: Patient with some nausea and vomiting and some abdominal discomfort. I spoke with the patient and the patient's son at the bedside. Dr. Bary Castilla to discuss with wife about surgery or not.  Objective: Filed Vitals:   04/08/15 1304 04/08/15 1441  BP: 98/53 111/65  Pulse: 71 75  Temp: 98 F (36.7 C)   Resp: 17     Filed Weights   04/07/15 0718 04/07/15 1348 04/08/15 0814  Weight: 60.782 kg (134 lb) 75.297 kg (166 lb) 53.252 kg (117 lb 6.4 oz)    ROS: Review of Systems  Constitutional: Negative for fever and chills.  Eyes: Negative for blurred vision.  Respiratory: Negative for cough and shortness of breath.   Cardiovascular: Negative for chest pain.  Gastrointestinal: Positive for nausea, vomiting and abdominal pain. Negative for diarrhea and constipation.  Genitourinary: Negative for dysuria.  Musculoskeletal: Negative for joint pain.  Neurological: Negative for dizziness and headaches.   Exam: Physical Exam  HENT:  Nose: No mucosal edema.  Mouth/Throat: No oropharyngeal exudate or posterior oropharyngeal edema.  Eyes: Conjunctivae, EOM and lids are normal. Pupils are equal, round, and reactive to light.  Neck: No JVD present. Carotid bruit is not present. No edema present. No thyroid mass and no thyromegaly present.  Cardiovascular: S1 normal and S2 normal.  Exam reveals no gallop.   No murmur heard. Pulses:      Dorsalis pedis pulses are 2+ on the right side, and 2+ on the left side.  Respiratory: No respiratory distress. He has no wheezes. He has no rhonchi. He has no rales.  GI: He exhibits distension. Bowel sounds are decreased. There is generalized tenderness. There is rigidity.  Musculoskeletal:       Right ankle: He exhibits no swelling.        Left ankle: He exhibits no swelling.  Lymphadenopathy:    He has no cervical adenopathy.  Neurological: He is alert. No cranial nerve deficit.  Skin: Skin is warm. No rash noted. Nails show no clubbing.  Psychiatric: He has a normal mood and affect.      Data Reviewed: Basic Metabolic Panel:  Recent Labs Lab 04/07/15 0741  NA 135  K 4.4  CL 106  CO2 22  GLUCOSE 90  BUN 24*  CREATININE 0.82  CALCIUM 8.1*   Liver Function Tests:  Recent Labs Lab 04/07/15 0741  AST 44*  ALT 27  ALKPHOS 258*  BILITOT 2.7*  PROT 7.7  ALBUMIN 2.1*    Recent Labs Lab 04/07/15 0741  LIPASE 13   CBC:  Recent Labs Lab 04/07/15 0741  WBC 7.1  HGB 10.4*  HCT 31.3*  MCV 79.4*  PLT 324   Cardiac Enzymes:  Recent Labs Lab 04/07/15 0813  TROPONINI 0.10*   Studies: Ct Abdomen Pelvis W Contrast  04/07/2015  CLINICAL DATA:  Increase abdomen and pelvic pain and distension. Status post paracentesis on 04/04/2015, yielding 2.2 L of fluid. Pancreatic cancer. EXAM: CT ABDOMEN AND PELVIS WITH CONTRAST TECHNIQUE: Multidetector CT imaging of the abdomen and pelvis was performed using the standard protocol following bolus administration of intravenous contrast. CONTRAST:  79mL OMNIPAQUE IOHEXOL 240 MG/ML SOLN, 164mL OMNIPAQUE IOHEXOL 300 MG/ML SOLN COMPARISON:  Previous examinations, the most recent dated 01/01/2015. FINDINGS: Lower chest: Increased size of small bilateral pleural effusions.  Mildly increased right lower lobe atelectasis and interval mild left lower lobe atelectasis. Dilated distal esophagus containing oral contrast. Increased size of a pericardial effusion, currently measuring 18 mm in maximum thickness and compressing the anterior aspect of the right ventricle. Hepatobiliary: The liver is compressed bottom loculated ascites lateral an anterior to the liver, with associated angulation of the liver margins. There is a heterogeneous mass in the anterior segment of the right lobe of  the liver, measuring 6.2 x 5.3 cm on image number 24. This previously measured 2.9 x 2.2 cm. This is contiguous or nearly contiguous with a heterogeneous mass more inferiorly in the posterior segment of the right lobe, measuring approximately 5.1 x 4.6 cm on image number 34. There is an interval heterogeneous mass in the posterior segment of the right lobe of the liver, measuring 5.0 x 4.4 cm on image number 29. Progressive intrahepatic biliary ductal dilatation, especially in the left lobe. This is due to a poorly defined mass in the inferior aspect of the left lobe, obstructing the intrahepatic ducts. There is also a poorly defined mass in the caudate lobe. Unremarkable gallbladder. Pancreas: The previously demonstrated 4.8 x 2.1 cm oval, hypoechoic mass in the tail of the pancreas coming containing intraductal calculi a, currently measures approximately 5.3 x 1.7 cm in corresponding dimensions. The remainder of the pancreas is atrophied, with progression. Spleen: Within normal limits in size and appearance. Adrenals/Urinary Tract: The kidneys are posteriorly displaced. There is mild to moderate dilatation of the left renal collecting system and proximal ureter with delayed excretion of contrast. No hydronephrosis is seen on the right. No calculi. Poorly distended urinary bladder. Stomach/Bowel: Multiple markedly dilated small bowel loops containing fluid, gas and feculent material. This transitions tube in normal caliber bowel in the inferior right pelvis, whether there is some intussusception. The colon is normal in caliber. Multiple sigmoid colon diverticula are noted. The stomach contains ingested material without significant dilatation. Vascular/Lymphatic: Mild atheromatous arterial calcifications. No enlarged lymph nodes identified. Reproductive: Mildly enlarged prostate gland. Other: Moderate amount of upper abdominal ascites. This is at least partially loculated, with some compression of the liver. There  is also extensive peritoneal tumor implants in the abdomen and pelvis. A right inguinal hernia contains fat and fluid. Musculoskeletal: Lumbar spine degenerative changes. Lipoma posterior to the right acetabulum. IMPRESSION: 1. Progressive liver metastatic disease with biliary obstruction in the left lobe. 2. Small bowel obstruction, most likely due to a combination of intussusception and peritoneal metastatic disease in the inferior right pelvis. 3. Extensive peritoneal metastatic disease with a moderate amount of partially loculated, malignant ascites in the upper abdomen. 4. Mild to moderate left hydronephrosis and proximal hydroureter, most likely due to compression of the mid ureter by adjacent dilated bowel. 5. Increased size of small bilateral pleural effusions. 6. Increased size of a moderate-sized pericardial effusion with some right ventricular compression. 7. Bilateral lower lobe atelectasis. 8. Dilated esophagus containing oral contrast, possibly representing refluxed contrast. 9. Right inguinal hernia containing fat and fluid. 10. Little change in the previously seen mass in the tail of the pancreas. Electronically Signed   By: Claudie Revering M.D.   On: 04/07/2015 10:11   Dg Chest Portable 1 View  04/07/2015  CLINICAL DATA:  Weakness and lethargy; abdominal pain and no bm x 1 week; abdominal paracentesis 2-17; hx pancreatic cancer, diabetes, and htn; nonsmoker EXAM: PORTABLE CHEST 1 VIEW COMPARISON:  12/23/2014.  CT, 01/29/2015. FINDINGS: Cardiac silhouette is mildly enlarged. Normal mediastinal and hilar  contours. Opacity at the medial right lung base is stable. No new lung opacities. No pleural effusion or pneumothorax. Right anterior chest wall Port-A-Cath is stable. Skeletal structures are unremarkable. IMPRESSION: 1. No acute cardiopulmonary disease. Stable appearance from the prior study. Electronically Signed   By: Lajean Manes M.D.   On: 04/07/2015 08:43    Scheduled Meds: . aspirin  81 mg  Oral Daily  . butamben-tetracaine-benzocaine  3 spray Topical Once  . carvedilol  12.5 mg Oral BID WC  . heparin  5,000 Units Subcutaneous 3 times per day  . insulin aspart  0-9 Units Subcutaneous TID WC  . nystatin  5 mL Oral TID  . nystatin  1 Bottle Topical TID   Continuous Infusions: . sodium chloride 50 mL/hr at 04/08/15 0903    Assessment/Plan:  1. Metastatic pancreatic cancer and likely malignant ascites. Overall prognosis is poor. Patient is a DO NOT RESUSCITATE. 2. Small bowel obstruction likely secondary to metastatic cancer. Dr. Tollie Pizza will discuss case with the patient's surgeon Dr. Jamal Collin. Depending on family and Dr. Angie Fava assessments will depend on the plan. If no surgical intervention, then would be a candidate for the hospice home. NG tube if needed. Hold aspirin in case surgery. Pain control with when necessary medication. Nausea medication as needed. 3. Hypertension essential- blood pressure on the lower side hold blood pressure medications 4. Type 2 diabetes- only sliding scale at this time since nothing by mouth 5. Failure to thrive  Code Status:     Code Status Orders        Start     Ordered   04/07/15 1353  Do not attempt resuscitation (DNR)   Continuous    Question Answer Comment  In the event of cardiac or respiratory ARREST Do not call a "code blue"   In the event of cardiac or respiratory ARREST Do not perform Intubation, CPR, defibrillation or ACLS   In the event of cardiac or respiratory ARREST Use medication by any route, position, wound care, and other measures to relive pain and suffering. May use oxygen, suction and manual treatment of airway obstruction as needed for comfort.      04/07/15 1352    Code Status History    Date Active Date Inactive Code Status Order ID Comments User Context   10/08/2014 12:31 AM 10/09/2014  8:02 PM Full Code [id number]  Juluis Mire, MD Inpatient    Advance Directive Documentation        Most Recent Value    Type of Advance Directive  Living will   Pre-existing out of facility DNR order (yellow form or pink MOST form)     "MOST" Form in Place?       Family Communication: Spoke with patient and son at the bedside Disposition Plan: To be determined based on surgical intervention or not  Consultants:  Gen. surgery  Time spent: 25 minutes  Harry Roman, [facility]

## 2015-04-08 NOTE — Progress Notes (Signed)
The patient was admitted with increasing abdominal pain and obstipation. CT scan obtained to the emergency department showed evidence of small bowel obstruction. The patient is undergone treatment for advanced pancreatic cancer with failure of chemotherapy to control ascites or metastatic disease. Presently under hospice care. He is undergone paracentesis on 2 occasions in the last 10 days for increasing abdominal distention with distress.  The CT scan was reviewed. Multiple dilated loops of small bowel. Loculated ascites. Large hepatic metastasis as well as a stable pancreatic primary. Bilateral small pleural effusions. Moderate size pericardial effusion.  Examination showed the patient to be resting fairly comfortably. Chest exam was clear. Heart sounds are muffled consistent with the known effusion. The abdomen is massively distended. Minimal tenderness to palpation. Bowel sounds are still present.  Laboratory studies from admission on February 18 show a mild anemia with hemoglobin of 10.4, platelet count of 324,000.  Comprehensive metabolic panel showed normal electrolytes, creatinine 0.5, estimated GFR greater than 60. Significant elevation of the alkaline phosphatase, bilirubin 2.7, increased from 1.31 month ago consistent with evidence of increased biliary dilatation on his recent CT. Serum albumin of 2.1.  Impression: Small  bowel obstruction secondary to metastatic tumor deposit/intussusception. Metastatic pancreatic carcinoma. Progressive biliary dilatation. Malnutrition. Pericardial effusion.  The patient was cooperative during exam, but tended does off-and-on secondary to previously administered medications.  I spoke with the patient's son and person, and the patient's wife of 19 years by phone. This is a difficult clinical situation, as he has a terminal disease with rapid progression and now the onset of an intestinal obstruction. It's unclear whether the patient has the cardiac reserve  in light of his effusion to tolerate formal laparotomy, and his stomach is obscured by dilated loops of small bowel making percutaneous gastrostomy impossible.  The patient's wife will discuss with the patient and her family their desires for aggressive intervention, recognizing this might add only days or weeks to his survival versus comfort care.  The patient is on the schedule for tomorrow for planned abdominal peritoneal drainage catheter placement which may or may not be appropriate in light of the recently identified small bowel obstruction. Dr. Orpah Melter will see the patient in the morning and meet with the family to discuss ongoing plans for surgical intervention.

## 2015-04-08 NOTE — Progress Notes (Signed)
Initial Nutrition Assessment    INTERVENTION:   Coordination of Care: await further decision regarding poc before making further nutritional recommendations Meals and Snacks:  Cater to patient preferences, currently CL diet ordered. Will request new weight.   NUTRITION DIAGNOSIS:   Inadequate oral intake related to acute illness, chronic illness as evidenced by NPO status  GOAL:   Patient will meet greater than or equal to 90% of their needs  MONITOR:    (Energy Intake, Anthropometrics,Digestive System, Electrolyte/Renal Profile)  REASON FOR ASSESSMENT:   Consult Poor PO  ASSESSMENT:    Pt admitted with abdominal distention and pain, surgery following, pt with bowel obstruction likely secondary to metastatic deposits involving the small bowel; pt poor surgical candidate per MD notes, MD recommending consideration of comfort care vs NG decompression; pt with metastatic malignant pancreatic adenocarcinoma, under hospice care at home, currently not receiving chemo. Palliative care consulted  Past Medical History  Diagnosis Date  . Hypertension   . Diabetes mellitus without complication ([place])   . Hyperlipidemia   . Ascites 2016  . Neuropathy ([place]) 09/30/2014  . Hypothyroidism   . Cancer ([place]) 2016    pancreas  . 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[place]) 05/22/2014     Diet Order:  Diet clear liquid Room service appropriate?: Yes; Fluid consistency:: Thin   Food and Nutrition Related History: per MD notes, wife reporting pt had been unable to take much orally, trying to drink Ensure though  Digestive System: abdomen distended, taut, no BM   Recent Labs Lab 04/07/15 0741  NA 135  K 4.4  CL 106  CO2 22  BUN 24*  CREATININE 0.82  CALCIUM 8.1*  GLUCOSE 90    Nutritional Anemia Profile:  CBC Latest Ref Rng 04/07/2015 03/15/2015 03/08/2015  WBC 3.8 - 10.6 K/uL 7.1 6.2 7.0  Hemoglobin 13.0 - 18.0 g/dL 10.4(L) 8.3(L) 8.1(L)  Hematocrit 40.0 - 52.0 % 31.3(L)  25.6(L) 25.4(L)  Platelets 150 - 440 K/uL 324 258 262    Protein Profile:   Recent Labs Lab 04/07/15 0741  ALBUMIN 2.1*   Meds: NS at 50 ml/hr, ss novolog, bowel regimen  Nutrition Focused Physical Exam:  Unable to complete Nutrition-Focused physical exam at this time.   Height:   Ht Readings from Last 1 Encounters:  04/07/15 5\' 7"  (1.702 m)    Weight: request new weight as weight trend does not correlate  Wt Readings from Last 1 Encounters:  04/07/15 166 lb (75.297 kg)   Wt Readings from Last 10 Encounters:  04/07/15 166 lb (75.297 kg)  03/15/15 122 lb 5.7 oz (55.5 kg)  03/08/15 137 lb (62.143 kg)  03/08/15 137 lb 12.6 oz (62.5 kg)  02/20/15 127 lb 13.9 oz (58 kg)  02/08/15 121 lb 11.1 oz (55.2 kg)  02/01/15 122 lb 2.5 oz (55.41 kg)  01/26/15 130 lb (58.968 kg)  01/25/15 141 lb 8.6 oz (64.2 kg)  01/04/15 130 lb 13.5 oz (59.35 kg)    BMI:  Body mass index is 25.99 kg/(m^2).  Estimated Nutritional Needs:   Kcal:  [id number] kcals (BEE 1389, 1.3 AF, 1.1-1.4 IF) using IBW 67 kg  Protein:  80-94 g (1.2-1.4 g/kg)   Fluid:  1675-2010 mL (25-30 ml/kg)      HIGH Care Level  Kerman Passey MS, RD, LDN [phone] Pager  [phone] Weekend/On-Call Pager

## 2015-04-08 NOTE — Progress Notes (Signed)
Wife had been informed of possibility of NG tube for relief of discomfort.  Reviewed w/ her that it would be placed and maintained to decompress the intestines.Desired insertion.

## 2015-04-09 ENCOUNTER — Encounter
Admission: EM | Disposition: A | Discharge status: Other Acute Inpatient Hospital | Payer: Self-pay | Source: Home / Self Care | Attending: Internal Medicine

## 2015-04-09 ENCOUNTER — Inpatient Hospital Stay: Admission: RE | Admit: 2015-04-09 | Payer: Medicare Other | Source: Ambulatory Visit | Admitting: General Surgery

## 2015-04-09 LAB — GLUCOSE, CAPILLARY
GLUCOSE-CAPILLARY: 56 mg/dL — AB (ref 65–99)
GLUCOSE-CAPILLARY: 57 mg/dL — AB (ref 65–99)
GLUCOSE-CAPILLARY: 80 mg/dL (ref 65–99)
Glucose-Capillary: 58 mg/dL — ABNORMAL LOW (ref 65–99)
Glucose-Capillary: 80 mg/dL (ref 65–99)
Glucose-Capillary: 77 mg/dL (ref 65–99)
Glucose-Capillary: 74 mg/dL (ref 65–99)

## 2015-04-09 SURGERY — INSERTION, CATHETER, PERITONEAL
Anesthesia: Choice

## 2015-04-09 MED ORDER — DEXTROSE 50 % IV SOLN
INTRAVENOUS | Status: AC
  Administered 2015-04-09: 17:00:00
  Filled 2015-04-09: qty 50

## 2015-04-09 MED ORDER — DEXTROSE 50 % IV SOLN
INTRAVENOUS | Status: AC
  Administered 2015-04-09: 08:00:00 25 mL via INTRAVENOUS
  Filled 2015-04-09: qty 50

## 2015-04-09 MED ORDER — DEXTROSE 50 % IV SOLN
25.0000 mL | Freq: Once | INTRAVENOUS | Status: AC
  Administered 2015-04-09: 25 mL via INTRAVENOUS

## 2015-04-09 MED ORDER — FLEET ENEMA 7-19 GM/118ML RE ENEM
1.0000 | ENEMA | Freq: Once | RECTAL | Status: AC
  Administered 2015-04-09: 14:00:00 1 via RECTAL

## 2015-04-09 MED ORDER — MORPHINE SULFATE (CONCENTRATE) 10 MG/0.5ML PO SOLN
10.0000 mg | ORAL | Status: DC | PRN
  Administered 2015-04-09 – 2015-04-10 (×3): 10 mg via ORAL
  Filled 2015-04-09 (×3): qty 1

## 2015-04-09 SURGICAL SUPPLY — 13 items
CANISTER SUCT 1200ML W/VALVE (MISCELLANEOUS) ×3 IMPLANT
COVER PROBE FLX POLY STRL (MISCELLANEOUS) ×3 IMPLANT
DRAPE SHEET LG 3/4 BI-LAMINATE (DRAPES) ×3 IMPLANT
GLOVE BIO SURGEON STRL SZ7 (GLOVE) ×3 IMPLANT
GOWN STRL REUS W/ TWL LRG LVL3 (GOWN DISPOSABLE) ×2 IMPLANT
GOWN STRL REUS W/TWL LRG LVL3 (GOWN DISPOSABLE) ×4
KIT PLEURX DRAIN CATH 1000ML (MISCELLANEOUS) IMPLANT
KIT RM TURNOVER STRD PROC AR (KITS) ×3 IMPLANT
LABEL OR SOLS (LABEL) ×3 IMPLANT
NS IRRIG 500ML POUR BTL (IV SOLUTION) ×3 IMPLANT
TOWEL OR 17X26 4PK STRL BLUE (TOWEL DISPOSABLE) ×3 IMPLANT
TUBING CONNECTING 10 (TUBING) ×2 IMPLANT
TUBING CONNECTING 10' (TUBING) ×1

## 2015-04-09 NOTE — Plan of Care (Signed)
Problem: Nutrition: Goal: Adequate nutrition will be maintained Outcome: Not Progressing Patient is NPO and has NG tube  Problem: Bowel/Gastric: Goal: Will not experience complications related to bowel motility Outcome: Not Progressing Patient last bowel movement unknown

## 2015-04-09 NOTE — Progress Notes (Signed)
Visit made. Patient is currently followed by [facility] [place] at home with a hospice diagnosis of pancreatic cancer. He is a DNR code. He was admitted to [facility] on 2/18 for evaluation and treatment of abdominal pain. Abdominal CT on 2/18 showed progressive liver mets,  small bowel obstruction, partially loculated malignant ascites as well as several other issues. Patient currently has an nasogastric tube in place for decompression. Dr. Jamal Collin has spoken to the the patient and his family and explained that  there is no surgical option at this time. No pleurex catheter to be placed either.  Patient seen lying in bed, appears cachectic, NG tube in place draining green colored liquid. Mrs. Martha and patient's son Barbaraann Rondo present at bedside. Patient reported his pain at 8/10. Per discussion with staff RN Dedra patient's wife has been reluctant for morphine to be used, patient was taking oxycodone/acetaminophen 5/325 mg at home. Writer provided education regarding use of liquid morphine as needed for pain control, Mrs. Hsieh voiced understanding and agreement. She did ask questions about her husband's care at home as well as the hospice home. She would like to wait until after the patient has an enema to see if that provides any improvement in his condition. Writer provided reassurance and emotional support. Hospital care team aware of Mrs. Dauphinais's request for enema to be tried and agreement to the use of liquid morphine for pain control. Will continue to follow and update hospice team. Thank you. Flo Shanks RN, BSN, [facility], [facility] [phone] c

## 2015-04-09 NOTE — Care Management Important Message (Signed)
Important Message  Patient Details  Name: Harry Roman MRN: [id number] Date of Birth: [birth year]   Medicare Important Message Given:  Yes    [name] 04/09/2015, 2:11 PM

## 2015-04-09 NOTE — Plan of Care (Signed)
Problem: Bowel/Gastric: Goal: Will not experience complications related to bowel motility Outcome: Not Progressing Patient's last BM unknown. Patient has had laxatives with no results. Abd is distended and taut. NG tube to LWIS right nare, tan/brown and thick. Denies pain.

## 2015-04-09 NOTE — H&P (Signed)
This patient was admitted to hospital on 04/07/15. Please see inpatient notes . His surgery is cancelled

## 2015-04-09 NOTE — Progress Notes (Addendum)
Patient ID: Harry Roman, male   DOB: January 01, 1946, 70 y.o.   MRN: [id number] [facility] PROGRESS NOTE  Harry Roman [id number] DOB: Mar 30, 1945 DOA: 04/07/2015 PCP: Marguerita Merles, MD  HPI/Subjective: Patient with abdominal pain. No vomiting since the NG tube was placed. Not feeling well at all.  Objective: Filed Vitals:   04/09/15 0508 04/09/15 1300  BP: 104/65 99/53  Pulse: 78 77  Temp: 98 F (36.7 C) 97.8 F (36.6 C)  Resp: 18 18    Filed Weights   04/07/15 0718 04/07/15 1348 04/08/15 0814  Weight: 60.782 kg (134 lb) 75.297 kg (166 lb) 53.252 kg (117 lb 6.4 oz)    ROS: Review of Systems  Constitutional: Negative for fever and chills.  Respiratory: Negative for cough and shortness of breath.   Cardiovascular: Negative for chest pain.  Gastrointestinal: Positive for nausea, abdominal pain and constipation. Negative for vomiting.  Genitourinary: Negative for dysuria.  Musculoskeletal: Negative for joint pain.  Neurological: Negative for dizziness and headaches.   Exam: Physical Exam  HENT:  Nose: No mucosal edema.  Mouth/Throat: No oropharyngeal exudate or posterior oropharyngeal edema.  Eyes: Conjunctivae, EOM and lids are normal. Pupils are equal, round, and reactive to light.  Neck: No JVD present. Carotid bruit is not present. No edema present. No thyroid mass and no thyromegaly present.  Cardiovascular: S1 normal and S2 normal.  Exam reveals no gallop.   No murmur heard. Pulses:      Dorsalis pedis pulses are 2+ on the right side, and 2+ on the left side.  Respiratory: No respiratory distress. He has no wheezes. He has no rhonchi. He has no rales.  GI: He exhibits distension. Bowel sounds are decreased. There is generalized tenderness. There is rigidity.  Musculoskeletal:       Right ankle: He exhibits no swelling.       Left ankle: He exhibits no swelling.  Lymphadenopathy:    He has no cervical adenopathy.  Neurological: He is alert. No  cranial nerve deficit.  Skin: Skin is warm. No rash noted. Nails show no clubbing.  Psychiatric: He has a normal mood and affect.      Data Reviewed: Basic Metabolic Panel:  Recent Labs Lab 04/07/15 0741  NA 135  K 4.4  CL 106  CO2 22  GLUCOSE 90  BUN 24*  CREATININE 0.82  CALCIUM 8.1*   Liver Function Tests:  Recent Labs Lab 04/07/15 0741  AST 44*  ALT 27  ALKPHOS 258*  BILITOT 2.7*  PROT 7.7  ALBUMIN 2.1*    Recent Labs Lab 04/07/15 0741  LIPASE 13   CBC:  Recent Labs Lab 04/07/15 0741  WBC 7.1  HGB 10.4*  HCT 31.3*  MCV 79.4*  PLT 324   Cardiac Enzymes:  Recent Labs Lab 04/07/15 0813  TROPONINI 0.10*   Studies: No results found.  Scheduled Meds: . aspirin  81 mg Oral Daily  . heparin  5,000 Units Subcutaneous 3 times per day  . insulin aspart  0-9 Units Subcutaneous TID WC  . nystatin  5 mL Oral TID  . nystatin  1 Bottle Topical TID   Continuous Infusions: . sodium chloride 50 mL/hr at 04/09/15 0206    Assessment/Plan:  1. Metastatic pancreatic cancer and likely malignant ascites. Overall prognosis is poor. Patient is a DO NOT RESUSCITATE. Patient is a candidate for the hospice home. Awaiting for family decision. 2. Small bowel obstruction likely secondary to metastatic cancer. Overall prognosis poor. Surgery and  family decided not to do any operation at this point. Surgery would not change his prognosis. Bowel obstruction unlikely to improve regardless of surgery or not. Try roxanol for pain. Still has ngt. Hospice home can do NGT. 3. Hypertension essential- blood pressure on the lower side hold blood pressure medications 4. Type 2 diabetes- only sliding scale at this time since nothing by mouth 5. Failure to thrive  Code Status:     Code Status Orders        Start     Ordered   04/07/15 1353  Do not attempt resuscitation (DNR)   Continuous    Question Answer Comment  In the event of cardiac or respiratory ARREST Do not  call a "code blue"   In the event of cardiac or respiratory ARREST Do not perform Intubation, CPR, defibrillation or ACLS   In the event of cardiac or respiratory ARREST Use medication by any route, position, wound care, and other measures to relive pain and suffering. May use oxygen, suction and manual treatment of airway obstruction as needed for comfort.      04/07/15 1352    Code Status History    Date Active Date Inactive Code Status Order ID Comments User Context   10/08/2014 12:31 AM 10/09/2014  8:02 PM Full Code [id number]  Juluis Mire, MD Inpatient    Advance Directive Documentation        Most Recent Value   Type of Advance Directive  Living will   Pre-existing out of facility DNR order (yellow form or pink MOST form)     "MOST" Form in Place?       Family Communication: Spoke with patient and son at the bedside Disposition Plan: To be determined based on surgical intervention or not  Consultants:  Gen. surgery  Time spent: 22 minutes  [place] [place] [facility]

## 2015-04-09 NOTE — Progress Notes (Signed)
PT Cancellation Note  Patient Details Name: Harry Roman MRN: [id number] DOB: 12-Oct-1945   Cancelled Treatment:    Reason Eval/Treat Not Completed: Medical issues which prohibited therapy (Chart reviewed for re-attempt at patient evaluation.  Per notes, patient/family scheduled to meet with Dr. Jamal Collin this AM to discuss treatment options, and patient tentatively scheduled for abdominal peritoneal drainage catheter placement this date.  Will continue hold until patient medically appropriate and goals of care established.)   [name], PT, DPT, NCS 04/09/2015, 8:21 AM [phone]

## 2015-04-09 NOTE — Care Management (Signed)
Admitted to [facility] with the diagnosis of abdominal pain (pancreatic cancer). Lives with wife, Emmytt Reggio [phone]). Dr. Delight Stare is listed as primary care physician. Dr. Mike Gip is physician that Mr. Deeter sees at the [facility]. Followed by Elda Dunkerson in the home. NPO. N/G to low suction continues.  Dr. Jamal Collin to evaluate today.  Family at the bedside. Shelbie Ammons RN MSN CCM Care Management [phone]

## 2015-04-09 NOTE — Progress Notes (Signed)
Spoke with Will, [employer] rep at [phone], to notify of non-emergent EMS transport.  Auth notification reference given as [id number].   Service date range good from 04/09/15 - 07/08/15.   Gap exception requested to determine if services can be considered at an in-network level.

## 2015-04-09 NOTE — Progress Notes (Addendum)
Patient ID: Harry Roman, male   DOB: [birth year], 70 y.o.   MRN: [id number] Surgery follow up. Events of the last week noted. Dr.Byrnett's consult noted. CT. Labs reviewed. Abdomen is distended and tympanitic.  NGT is draining moderate amounts Based on on the findings on CT, clinical history, labs, etc do not feel surgery [place] indicated. He has likely multiple mets, there is pericardial effusion, he is emaciated, anemic-all pointing to  potential for multiple problems, especially not coming off the anesthesia.  He likely has no correctable surgical situation. All of this discussed in full with pt, his wife and his son. Pt is agreeable to hospice care alone.  Information relayed to nursing and hospice nurse. Also informed Dr. Cy Blamer.

## 2015-04-10 LAB — GLUCOSE, CAPILLARY
GLUCOSE-CAPILLARY: 56 mg/dL — AB (ref 65–99)
Glucose-Capillary: 88 mg/dL (ref 65–99)

## 2015-04-10 LAB — CBC
HCT: 26.4 % — ABNORMAL LOW (ref 40.0–52.0)
HEMOGLOBIN: 8.8 g/dL — AB (ref 13.0–18.0)
MCH: 26.2 pg (ref 26.0–34.0)
MCHC: 33.2 g/dL (ref 32.0–36.0)
MCV: 78.8 fL — ABNORMAL LOW (ref 80.0–100.0)
Platelets: 213 10*3/uL (ref 150–440)
RBC: 3.36 MIL/uL — ABNORMAL LOW (ref 4.40–5.90)
RDW: 20.7 % — AB (ref 11.5–14.5)
WBC: 3.6 10*3/uL — AB (ref 3.8–10.6)

## 2015-04-10 LAB — CREATININE, SERUM: CREATININE: 1.05 mg/dL (ref 0.61–1.24)

## 2015-04-10 MED ORDER — MORPHINE SULFATE (CONCENTRATE) 10 MG/0.5ML PO SOLN: 10.0000 mg | ORAL | Status: AC | PRN

## 2015-04-10 MED ORDER — DEXTROSE 50 % IV SOLN
25.0000 mL | Freq: Once | INTRAVENOUS | Status: AC
  Administered 2015-04-10: 08:00:00 25 mL via INTRAVENOUS
  Filled 2015-04-10: qty 50

## 2015-04-10 MED ORDER — PROMETHAZINE HCL 25 MG RE SUPP: 25.0000 mg | Freq: Four times a day (QID) | RECTAL | Status: AC | PRN

## 2015-04-10 MED ORDER — PROMETHAZINE HCL 25 MG RE SUPP
25.0000 mg | Freq: Four times a day (QID) | RECTAL | Status: DC | PRN
  Filled 2015-04-10 (×2): qty 1

## 2015-04-10 NOTE — Progress Notes (Signed)
Patient discharged via EMS to [facility]. Family at bedside. Madlyn Frankel, RN

## 2015-04-10 NOTE — Progress Notes (Signed)
EMS notified for transport. Hospital care team, family, patient and hospice home all aware. Thank you. Flo Shanks RN, BSN, [employer] [facility] [place], [facility] [phone] c

## 2015-04-10 NOTE — Discharge Summary (Signed)
[place] [facility] [place] NAME: Harry Roman    MR#:  [id number]  DATE OF BIRTH:  1945-09-30  DATE OF ADMISSION:  04/07/2015 ADMITTING PHYSICIAN: Fritzi Mandes, MD  DATE OF DISCHARGE: 04/10/2015  PRIMARY CARE PHYSICIAN: Marguerita Merles, MD    ADMISSION DIAGNOSIS:  Intussusception ([place]) [K56.1] Pericardial tamponade [I31.4] Small bowel obstruction ([place]) [K56.69] Abdominal pain, unspecified abdominal location [R10.9]  DISCHARGE DIAGNOSIS:  Active Problems:   Abdominal pain   SECONDARY DIAGNOSIS:   Past Medical History  Diagnosis Date  . Hypertension   . Diabetes mellitus without complication ([place])   . Hyperlipidemia   . Ascites 2016  . Neuropathy ([place]) 09/30/2014  . Hypothyroidism   . Cancer ([place]) 2016    pancreas  . 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[place]) 05/22/2014    HOSPITAL COURSE:   1. Metastatic pancreatic cancer likely malignant ascites. Overall prognosis is poor. Patient is a DO NOT RESUSCITATE. Patient will go to the hospice home today. 2. Small bowel obstruction likely secondary to metastatic cancer. Overall prognosis is poor. Patient was seen in consultation by general surgery and the decision was for comfort care measures. Bowel extraction unlikely to improve regardless of surgery not and we'll not change his prognosis. Roxanol as needed for pain. Hospice home can do an NG tube to intermittent suction. When necessary Phenergan suppositories. I do not expect the patient to survive much longer. Family at the bedside understands. Patient can have diet for pleasure.   3. Type 2 diabetes with hypoglycemia- old diabetic meds were stopped 4. Failure to thrive 5. Essential hypertension blood pressure on the lower side- holding all blood pressure medications 6. Elevated troponin- demand ischemia 7. Anemia of chronic disease  DISCHARGE CONDITIONS:   Guarded  CONSULTS OBTAINED:  Treatment Team:  Christene Lye,  MD Robert Bellow, MD  DRUG ALLERGIES:  No Known Allergies  DISCHARGE MEDICATIONS:   Current Discharge Medication List    START taking these medications   Details  Morphine Sulfate (MORPHINE CONCENTRATE) 10 MG/0.5ML SOLN concentrated solution Take 0.5 mLs (10 mg total) by mouth every hour as needed for severe pain or shortness of breath. Qty: 30 mL, Refills: 0    promethazine (PHENERGAN) 25 MG suppository Place 1 suppository (25 mg total) rectally every 6 (six) hours as needed for nausea or vomiting. Qty: 12 each, Refills: 0      CONTINUE these medications which have NOT CHANGED   Details  nystatin (MYCOSTATIN) 100000 UNIT/ML suspension Take 5 mLs by mouth See admin instructions. Swish and spit 5 ml by mouth 2 to 3 times a day for thrush.    nystatin (MYCOSTATIN) powder Apply 1 application topically See admin instructions. Apply to groin area topically 2 to 3 times a day for rash.      STOP taking these medications     aspirin 81 MG tablet      carvedilol (COREG) 12.5 MG tablet      enalapril (VASOTEC) 10 MG tablet      enalapril (VASOTEC) 20 MG tablet      ferrous sulfate 325 (65 FE) MG tablet      Insulin Detemir (LEVEMIR FLEXTOUCH) 100 UNIT/ML Pen      KLOR-CON M20 20 MEQ tablet      linagliptin (TRADJENTA) 5 MG TABS tablet      OxyCODONE HCl, Abuse Deter, (OXAYDO) 5 MG TABA      gabapentin (NEURONTIN) 100 MG capsule  hydrOXYzine (VISTARIL) 25 MG capsule      insulin regular (NOVOLIN R,HUMULIN R) 100 units/mL injection      KLOR-CON M20 20 MEQ tablet      oxyCODONE-acetaminophen (ROXICET) 5-325 MG tablet          DISCHARGE INSTRUCTIONS:   Follow-up the hospice home one day NG tube to intermittent suction  If you experience worsening of your admission symptoms, develop shortness of breath, life threatening emergency, suicidal or homicidal thoughts you must seek medical attention immediately by calling 911 or calling your MD immediately  if  symptoms less severe.  You Must read complete instructions/literature along with all the possible adverse reactions/side effects for all the Medicines you take and that have been prescribed to you. Take any new Medicines after you have completely understood and accept all the possible adverse reactions/side effects.   Please note  You were cared for by a hospitalist during your hospital stay. If you have any questions about your discharge medications or the care you received while you were in the hospital after you are discharged, you can call the unit and asked to speak with the hospitalist on call if the hospitalist that took care of you is not available. Once you are discharged, your primary care physician will handle any further medical issues. Please note that NO REFILLS for any discharge medications will be authorized once you are discharged, as it is imperative that you return to your primary care physician (or establish a relationship with a primary care physician if you do not have one) for your aftercare needs so that they can reassess your need for medications and monitor your lab values.    Today   CHIEF COMPLAINT:   Chief Complaint  Patient presents with  . Abdominal Pain    HISTORY OF PRESENT ILLNESS:  Harry Roman  is a 70 y.o. male with a known history of pancreatic cancer presented with nausea vomiting and abdominal pain and found to have a small bowel obstruction and worsening metastatic disease in his abdomen   VITAL SIGNS:  Blood pressure 110/62, pulse 83, temperature 98.1 F (36.7 C), temperature source Oral, resp. rate 18, height 5\' 7"  (1.702 m), weight 53.252 kg (117 lb 6.4 oz), SpO2 100 %.    PHYSICAL EXAMINATION:  GENERAL:  70 y.o.-year-old patient lying in the bed with no acute distress.  EYES: Pupils equal, round, reactive to light and accommodation. No scleral icterus.  HEENT: Head atraumatic, normocephalic. Oropharynx and nasopharynx clear.  NECK:   Supple, no jugular venous distention. No thyroid enlargement, no tenderness.  LUNGS: Decreased breath sounds bilaterally, no wheezing, rales,rhonchi or crepitation. No use of accessory muscles of respiration.  CARDIOVASCULAR: S1, S2 normal. 2/6 systolic murmurs, no rubs, or gallops.  ABDOMEN: Soft, tender, distended. Bowel sounds decreased.  EXTREMITIES: No pedal edema, cyanosis, or clubbing.  NEUROLOGIC: Patient alert and follows commands PSYCHIATRIC: The patient is alert and oriented x 3.  SKIN: No obvious rash, lesion, or ulcer.   DATA REVIEW:   CBC  Recent Labs Lab 04/10/15 0459  WBC 3.6*  HGB 8.8*  HCT 26.4*  PLT 213    Chemistries   Recent Labs Lab 04/07/15 0741 04/10/15 0459  NA 135  --   K 4.4  --   CL 106  --   CO2 22  --   GLUCOSE 90  --   BUN 24*  --   CREATININE 0.82 1.05  CALCIUM 8.1*  --   AST 44*  --  ALT 27  --   ALKPHOS 258*  --   BILITOT 2.7*  --     Cardiac Enzymes  Recent Labs Lab 04/07/15 0813  TROPONINI 0.10*      Management plans discussed with the patient, family and they are in agreement.  CODE STATUS:     Code Status Orders        Start     Ordered   04/07/15 1353  Do not attempt resuscitation (DNR)   Continuous    Question Answer Comment  In the event of cardiac or respiratory ARREST Do not call a "code blue"   In the event of cardiac or respiratory ARREST Do not perform Intubation, CPR, defibrillation or ACLS   In the event of cardiac or respiratory ARREST Use medication by any route, position, wound care, and other measures to relive pain and suffering. May use oxygen, suction and manual treatment of airway obstruction as needed for comfort.      04/07/15 1352    Code Status History    Date Active Date Inactive Code Status Order ID Comments User Context   10/08/2014 12:31 AM 10/09/2014  8:02 PM Full Code [id number]  Juluis Mire, MD Inpatient    Advance Directive Documentation        Most Recent Value   Type of  Advance Directive  Living will   Pre-existing out of facility DNR order (yellow form or pink MOST form)     "MOST" Form in Place?        TOTAL TIME TAKING CARE OF THIS PATIENT: 35 minutes.    Loletha Grayer M.D on 04/10/2015 at 9:59 AM  Between 7am to 6pm - Pager - [phone]  After 6pm go to [email or web address] - password [email or web address] [place] [facility]  Office  [phone]  CC: Primary care physician; Marguerita Merles, MD

## 2015-04-10 NOTE — Progress Notes (Signed)
PT Cancellation Note  Patient Details Name: [name] MRN: [id number] DOB: Feb 06, 1946   Cancelled Treatment:    Reason Eval/Treat Not Completed: Medical issues which prohibited therapy (Per continued chart review, patient/family planning for transition to hospice home for symptom management and end-of-life care this date.  Will complete PT order at this tim.e)   Reyes Ivan. Owens Shark, PT, DPT, NCS 04/10/2015, 11:04 AM [phone]

## 2015-04-10 NOTE — Clinical Social Work Note (Signed)
Pt is ready for discharge today to [facility]. Pt and family are aware and agreeable to discharge plan. Flo Shanks, hospital liaision, made arrangements for transfer. CSW prepared discharge packet. Reoprt has been called. [facility] will provide transportation to facility. CSW is signing off as no further needs identified.   Darden Dates, MSW, LCSW  Clinical Social Worker  [phone]

## 2015-04-10 NOTE — Progress Notes (Signed)
Visit made. Per chart review and conversation with patient's wife, the enema produced no results yesterday. Patient remains with nasogastric tube in place to low intermittent suction continued drainage. Writer spoke with patient, who was alert, his wife and son and they would like for him to transfer to the Hospice home today for management of pain/nausea and end of life care. Questions answered, process explained and reviewed. Patient continues to report pain at 8/10. Liquid morphine 10 mg given just prior to RN visit. Discussed premedication prior to transfer with both patient and his wife as well as staff RN Ok Edwards, all in agreement. Hospital care team all aware of and in agreement with planned transfer via EMS to the hospice home today. Signed portable DNR in place in transfer packet. Report called to the hospice home, writer to notify EMS for transport. Thank you. Flo Shanks RN, BSN, [facility]

## 2015-04-10 NOTE — Progress Notes (Signed)
NG tube clamped for transport. Madlyn Frankel, RN

## 2015-04-12 ENCOUNTER — Inpatient Hospital Stay: Payer: Medicare Other | Admitting: Hematology and Oncology

## 2015-04-18 DEATH — deceased

## 2015-08-06 ENCOUNTER — Other Ambulatory Visit: Payer: Self-pay | Admitting: Nurse Practitioner

## 2016-11-12 IMAGING — US US GUIDE NEEDLE - US PARA
1 series · 14 of 16 positions shown · non-contrast
Comparison: None.

CLINICAL DATA: Evidence for neoplastic disease within the abdomen
with ascites. Request for therapeutic and diagnostic paracentesis.

EXAM:
ULTRASOUND GUIDED PARACENTESIS

[Series 1: us guide needle - us para · 0.31mm/px · 14 of 16 slices shown]
[im 1/16]
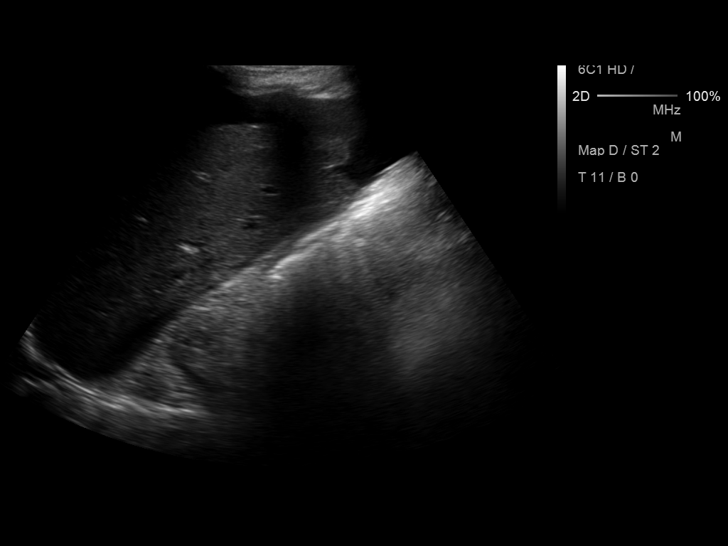
[im 2/16]
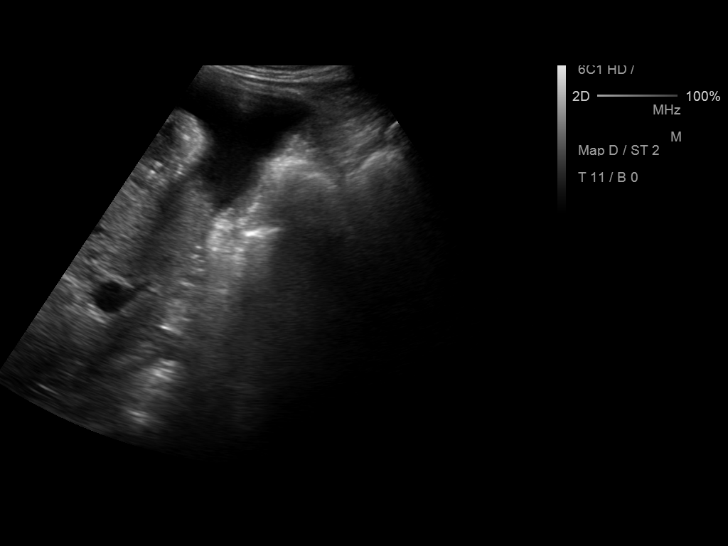
[im 3/16]
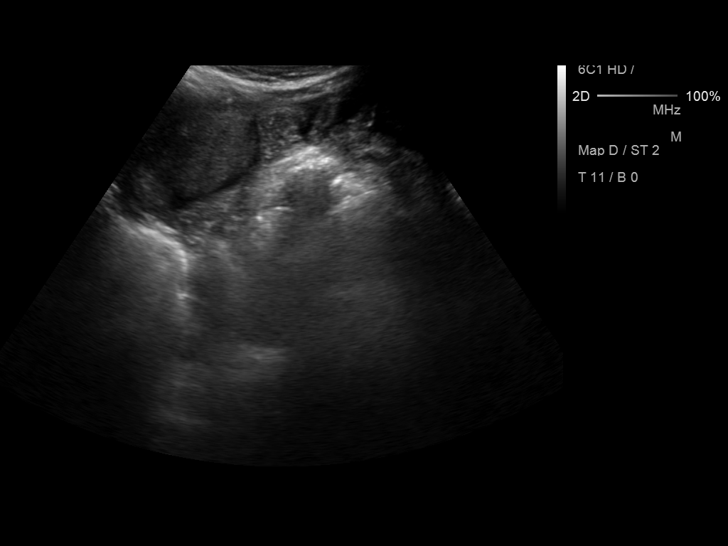
[im 5/16]
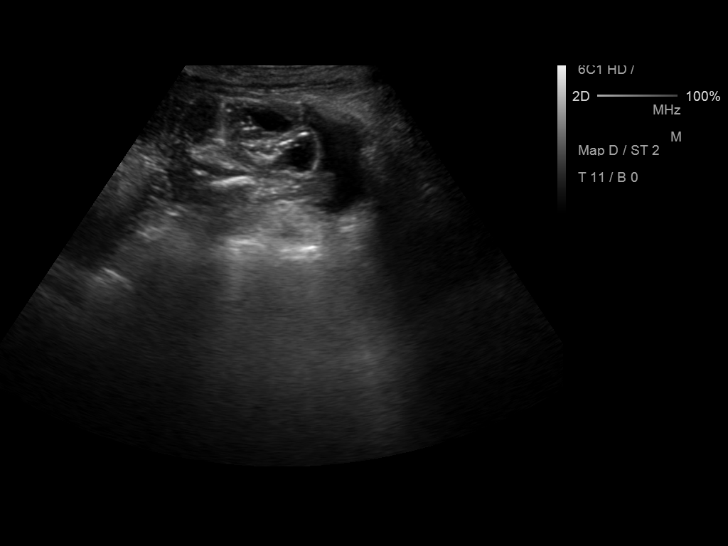
[im 6/16]
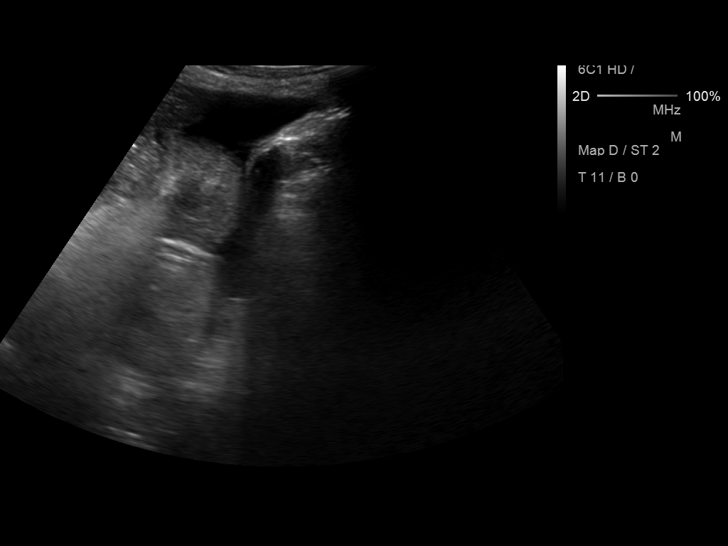
[im 7/16]
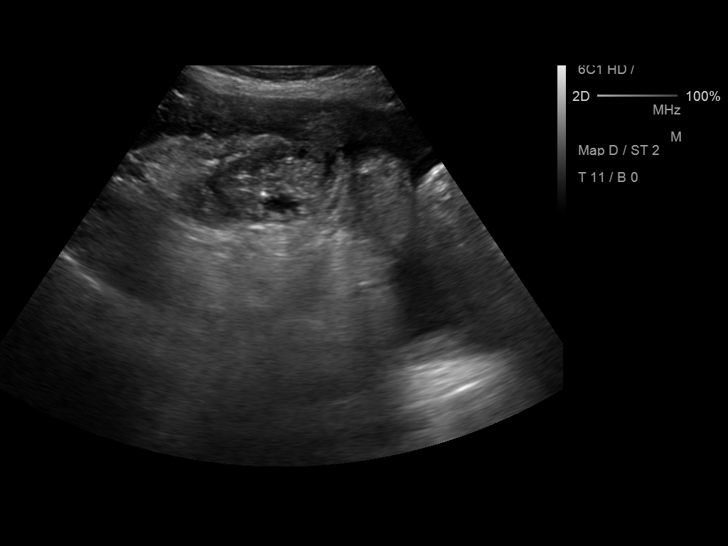
[im 8/16]
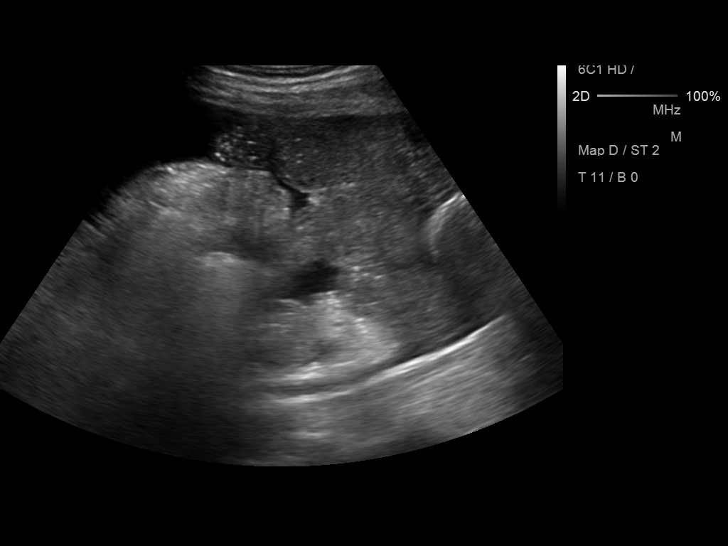
[im 9/16]
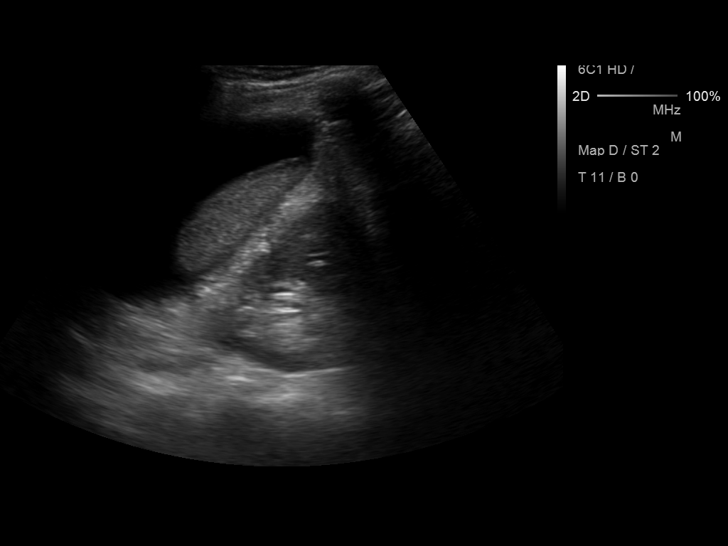
[im 10/16]
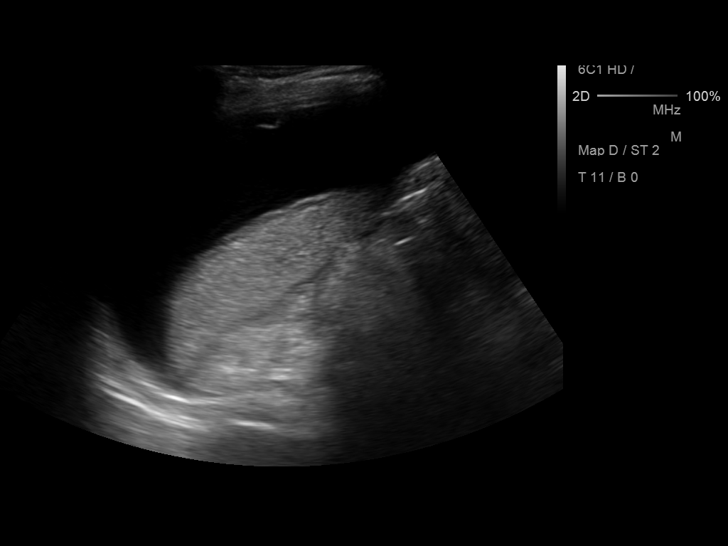
[im 11/16]
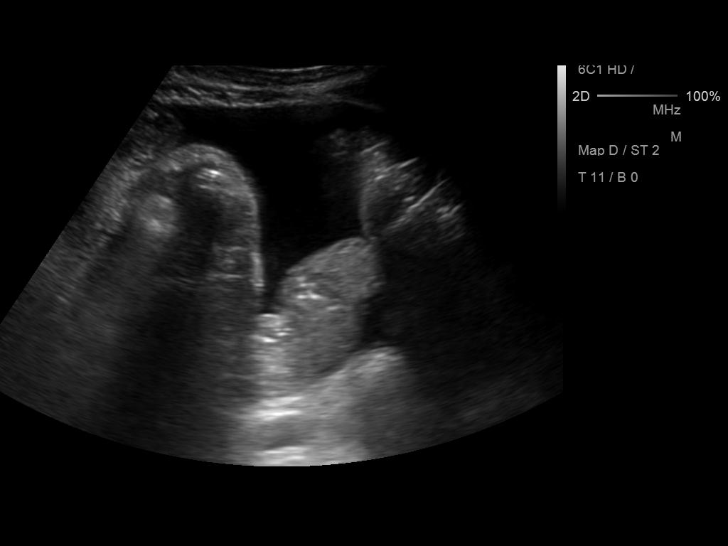
[im 13/16]
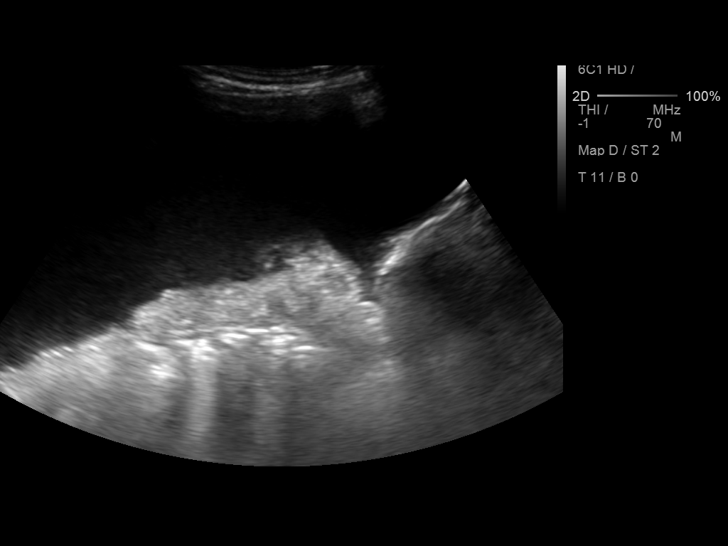
[im 14/16]
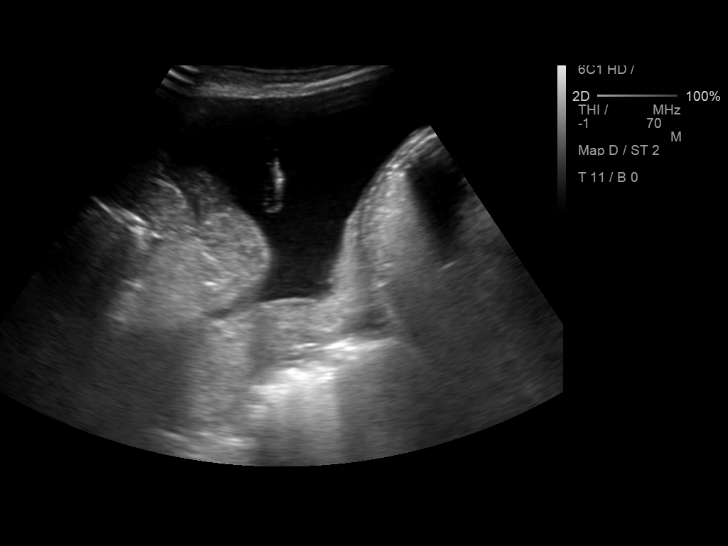
[im 15/16]
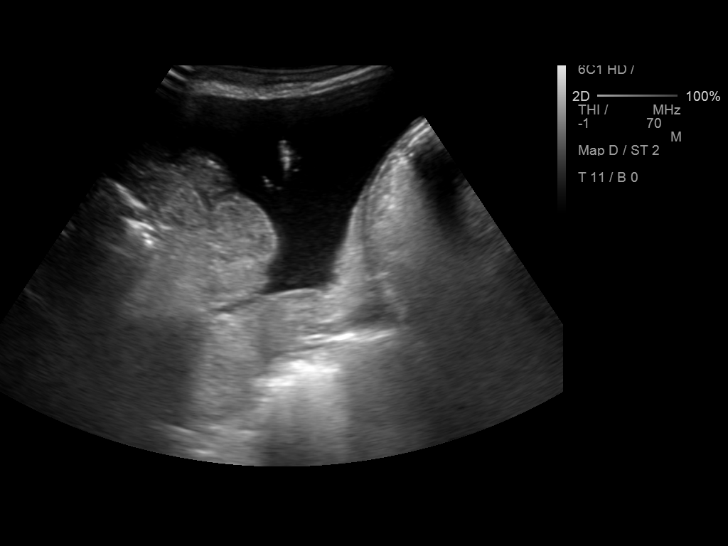
[im 16/16]
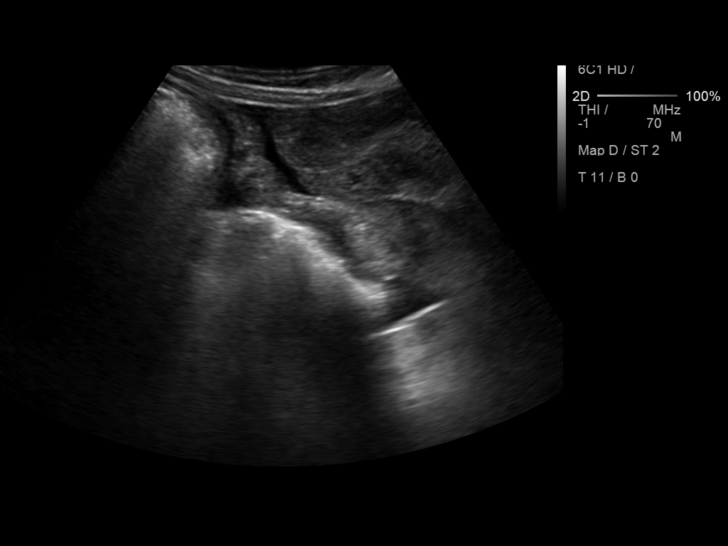

[14 of 16 positions shown; findings below may reference images not displayed]

PROCEDURE:
An ultrasound guided paracentesis was thoroughly discussed with the
patient and questions answered. The benefits, risks, alternatives
and complications were also discussed. The patient understands and
wishes to proceed with the procedure. Written consent was obtained.

Ultrasound was performed to localize and mark an adequate pocket of
fluid in the upper midline. The area was then prepped and draped in
the normal sterile fashion. 1% Lidocaine was used for local
anesthesia. Under ultrasound guidance a Safe-T-Centesis catheter was
introduced. Paracentesis was performed. The catheter was removed and
a dressing applied.

Complications: None.
FINDINGS: A total of approximately 1.85 L of amber colored fluid was removed.
A fluid sample was sent for laboratory analysis.
IMPRESSION: Successful ultrasound guided paracentesis yielding 1.85 L of
ascites.

## 2017-01-27 IMAGING — CR DG ABD PORTABLE 2V
1 series · 2 of 2 positions shown · non-contrast
Comparison: 05/30/2014

CLINICAL DATA: Abdominal distension

EXAM:
PORTABLE ABDOMEN - 2 VIEW

[Series 1: supine ap · 0.17mm/px · 2 of 2 slices shown]
[im 1/2]
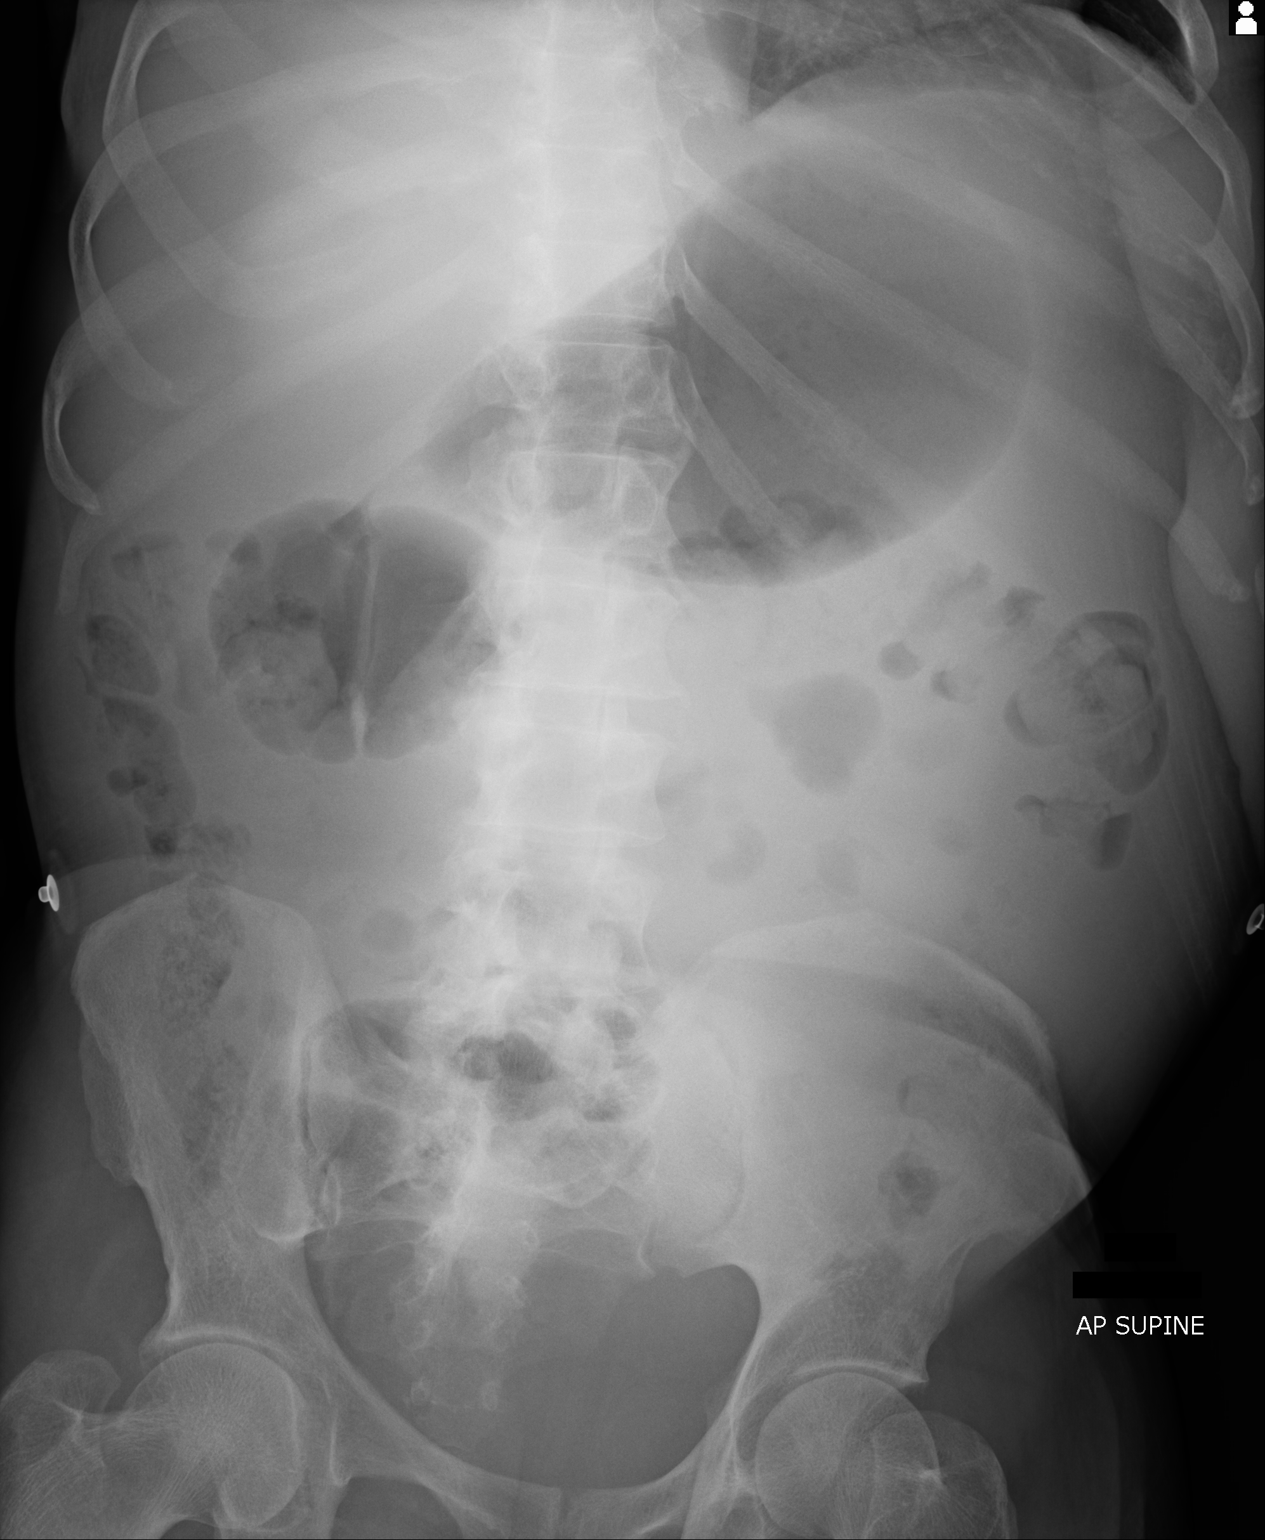
[im 2/2]
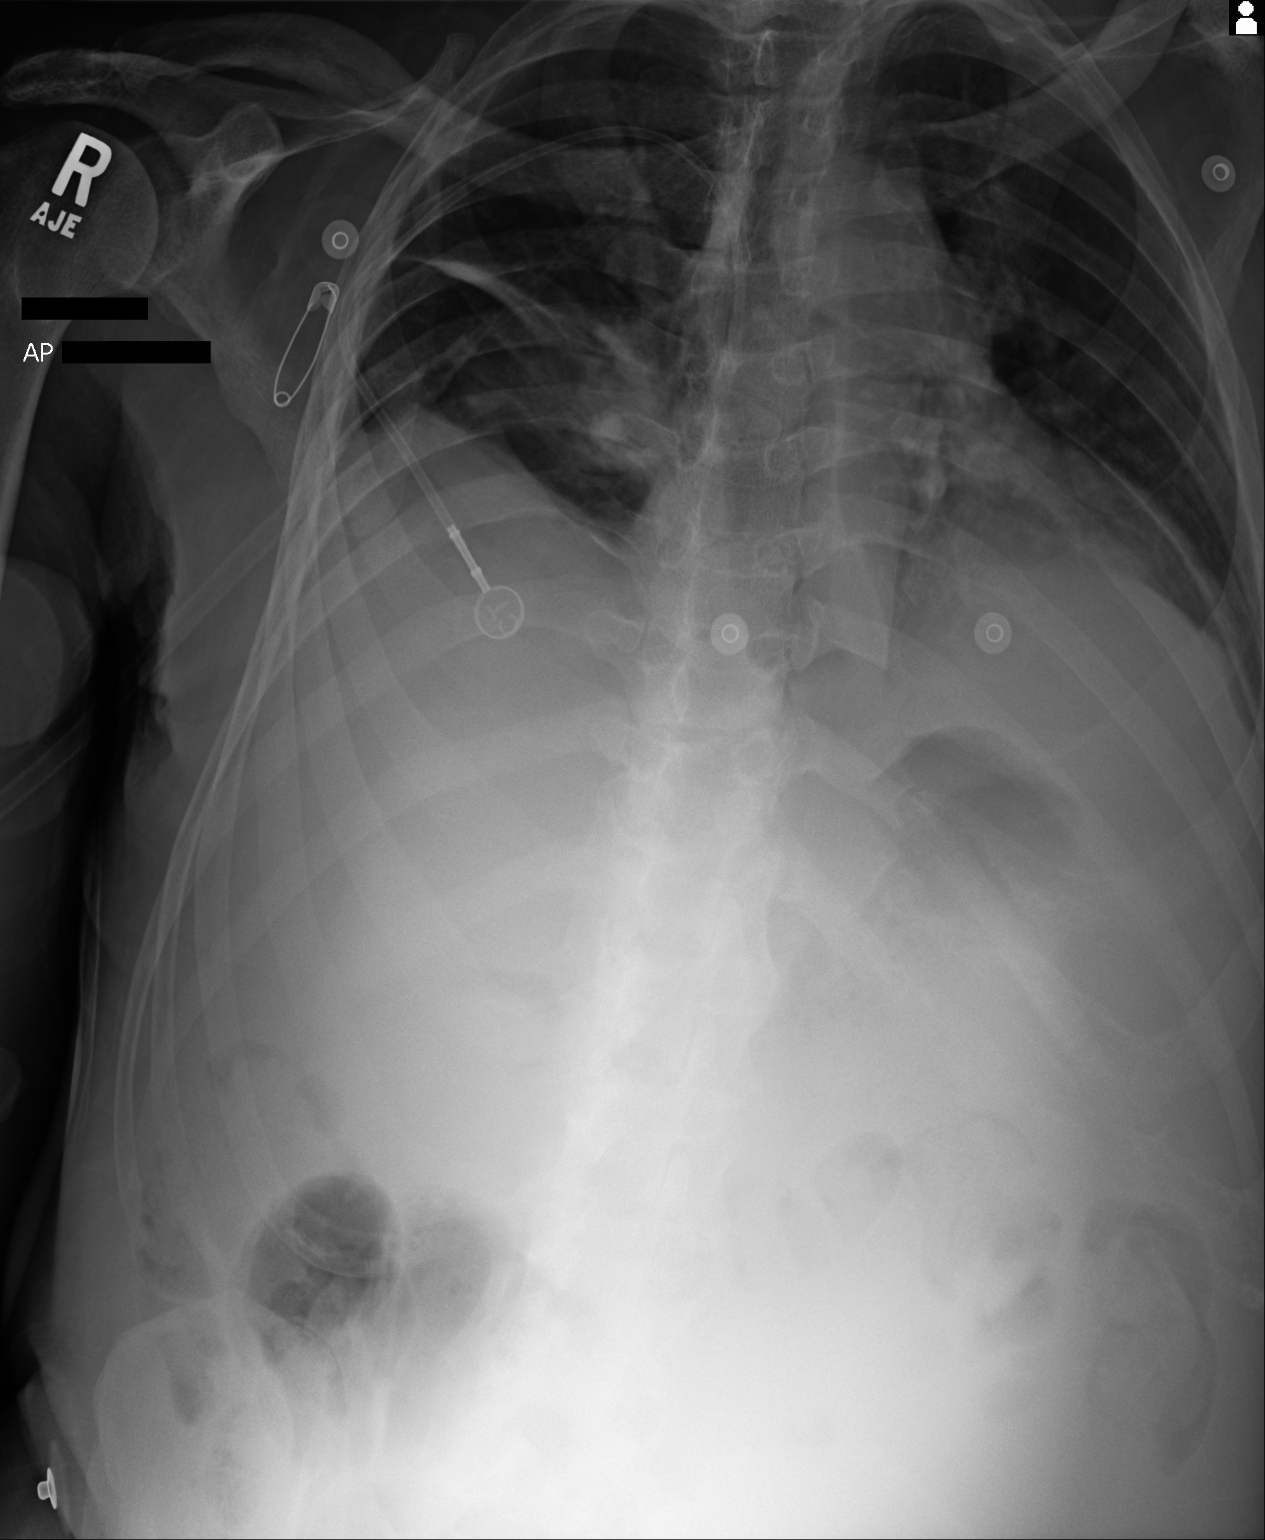

[2 of 2 positions shown; findings below may reference images not displayed]

FINDINGS: Scattered large and small bowel gas is noted. The stomach is
somewhat distended with air and food stuffs. No free air is seen.
Right chest wall port is noted. Mild bibasilar atelectatic changes
are seen.
IMPRESSION: Bibasilar atelectasis.

No other focal abnormality is seen.

## 2017-02-14 IMAGING — CT CT ABD-PELV W/ CM
1 of 3 series · 13 of 32 positions shown, 18 images · IV contrast (omnipaque)
Comparison: 04/20/2014.

CLINICAL DATA: Subsequent encounter for pancreatic cancer

EXAM:
CT ABDOMEN AND PELVIS WITH CONTRAST
TECHNIQUE: Multidetector CT imaging of the abdomen and pelvis was performed
using the standard protocol following bolus administration of
intravenous contrast.
CONTRAST:  100mL OMNIPAQUE IOHEXOL 300 MG/ML  SOLN

[Series 2: routine abd pel with · axial · 0.68mm/px · z∈[-496,-70]mm · 13 of 95 slices shown, 18 images]
[im 5/95  soft-tissue]
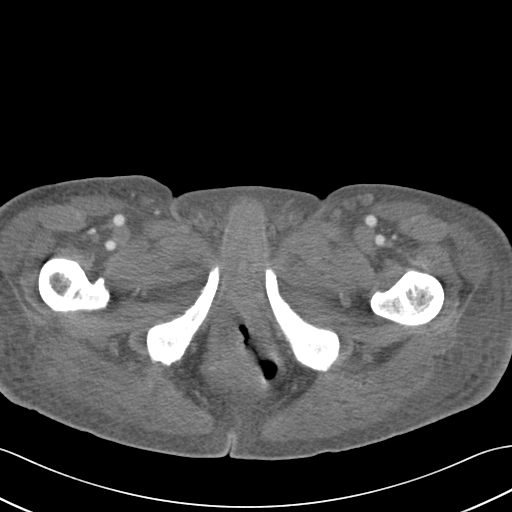
[im 5/95  bone]
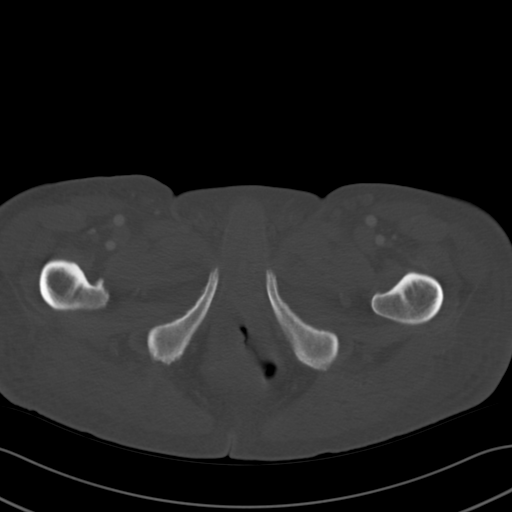
[im 15/95  soft-tissue]
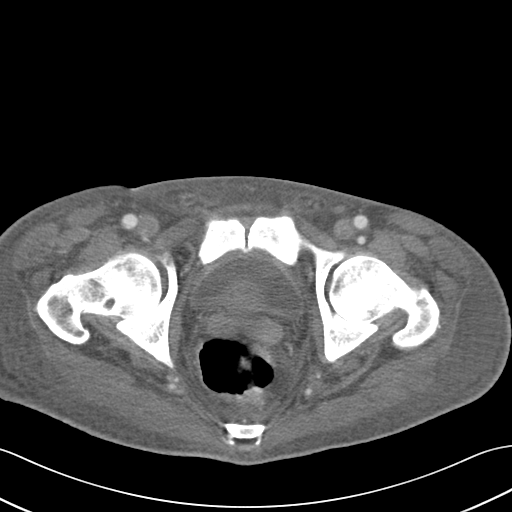
[im 19/95  soft-tissue]
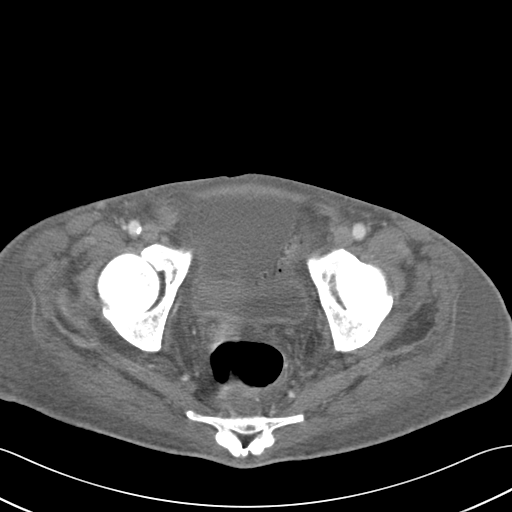
[im 29/95  soft-tissue]
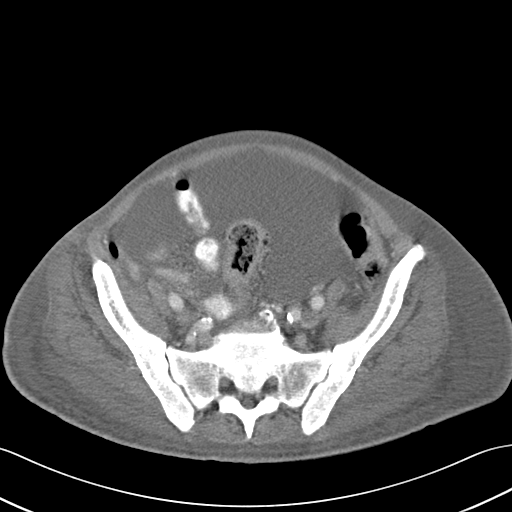
[im 38/95  soft-tissue]
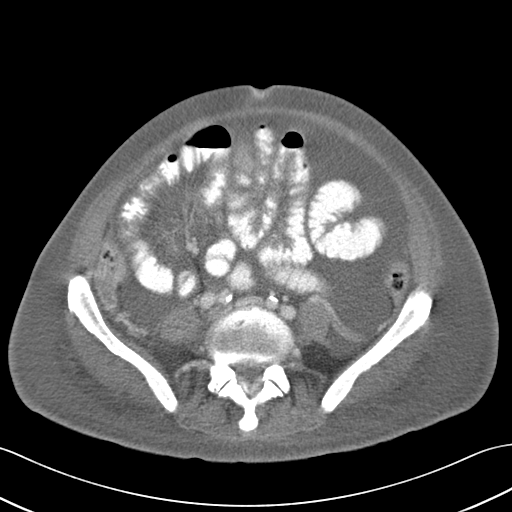
[im 43/95  soft-tissue]
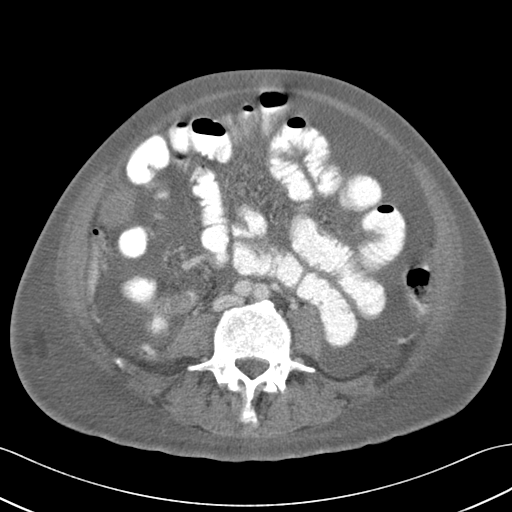
[im 52/95  soft-tissue]
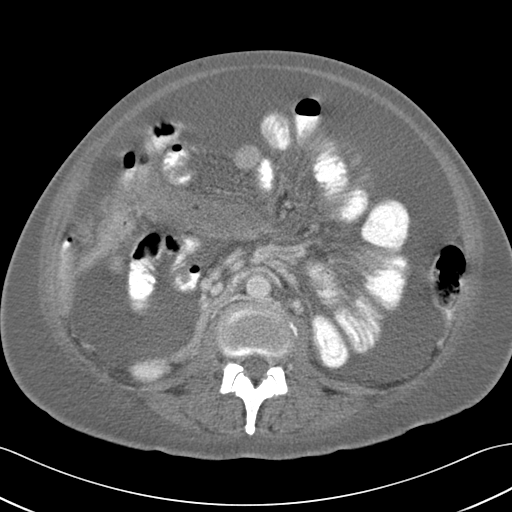
[im 57/95  soft-tissue]
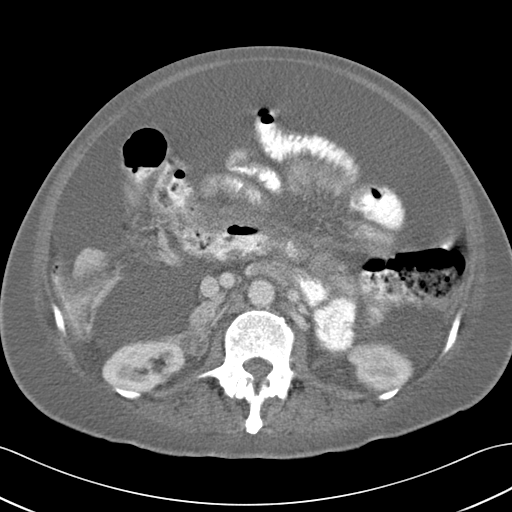
[im 66/95  soft-tissue]
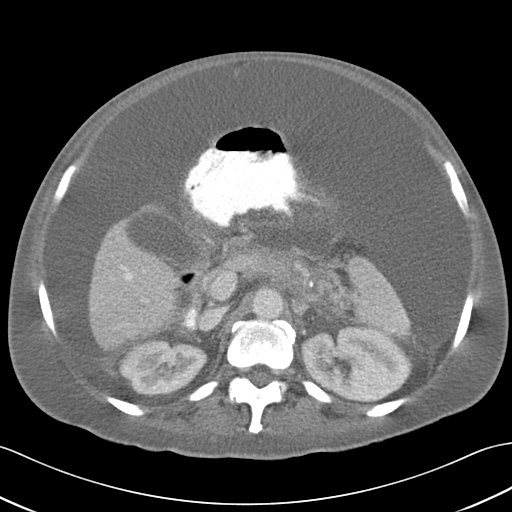
[im 66/95  bone]
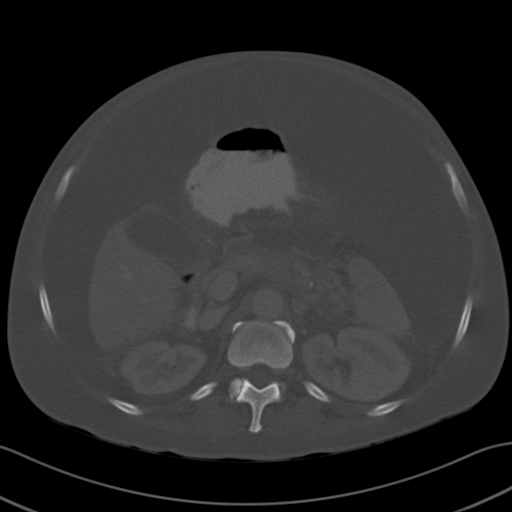
[im 76/95  soft-tissue]
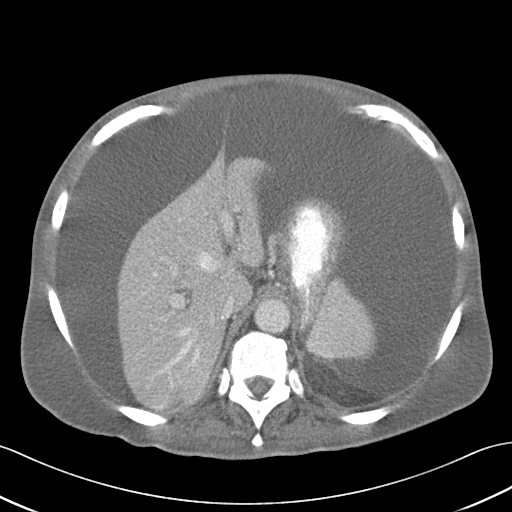
[im 76/95  lung]
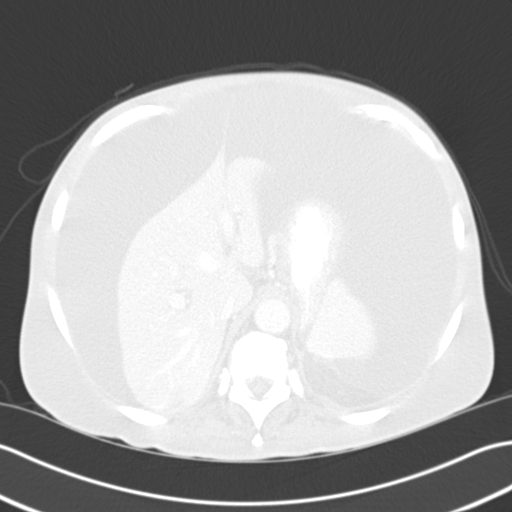
[im 80/95  soft-tissue]
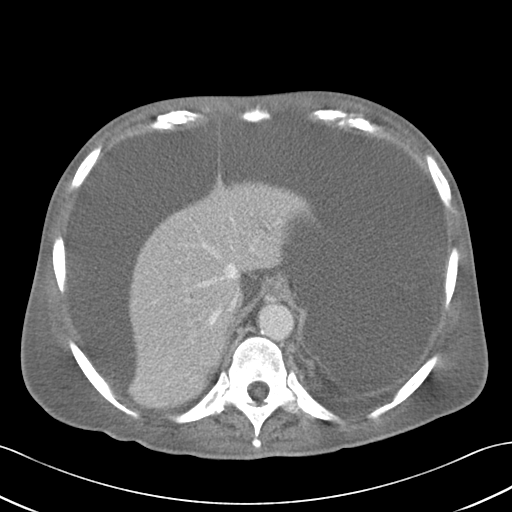
[im 80/95  lung]
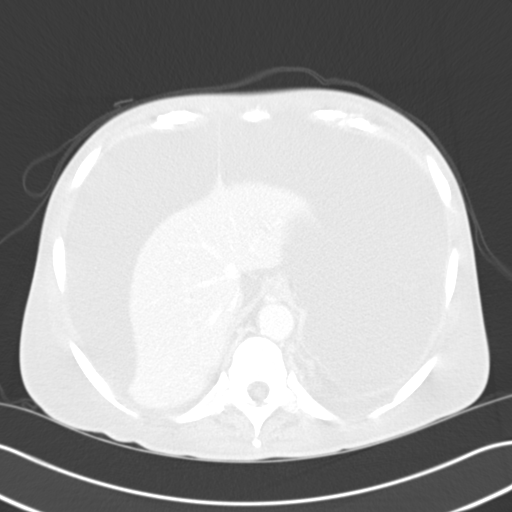
[im 85/95  lung]
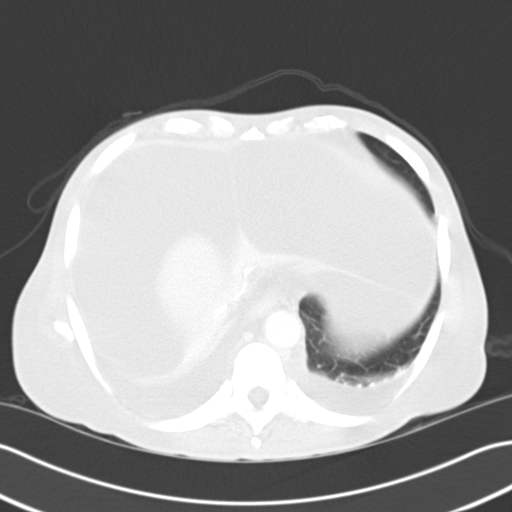
[im 90/95  soft-tissue]
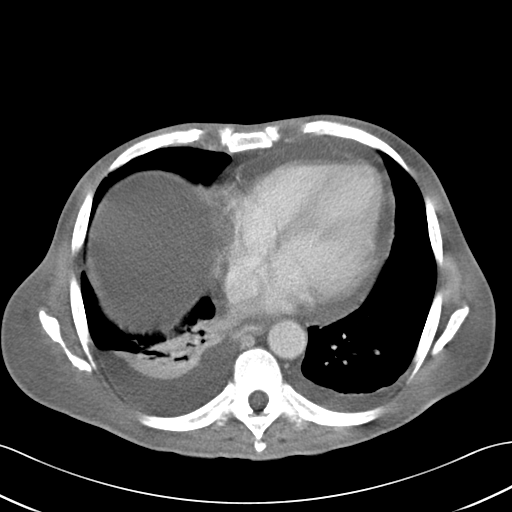
[im 90/95  lung]
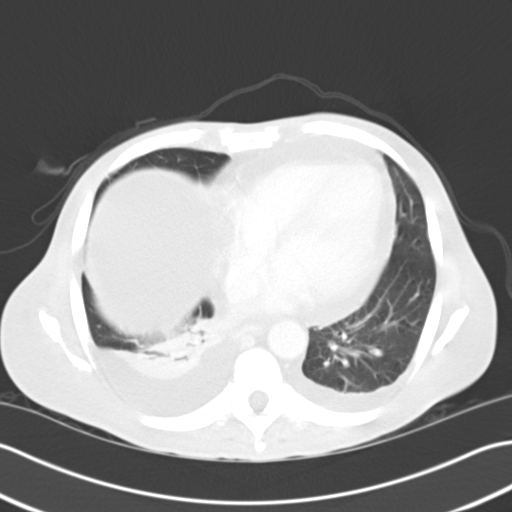

[13 of 32 positions shown; findings below may reference images not displayed]

FINDINGS: Lower chest: Right lower lobe atelectasis noted. Small right pleural
effusion associated with a tiny left pleural effusion.

Hepatobiliary: Liver shows mass-effect from intraperitoneal fluid.
The 3.2 x 2.5 cm posterior left liver lesion seen previously now
measures 2.1 x 2.6 cm. Gallbladder is distended. No substantial
intra or extrahepatic biliary duct dilatation.

Pancreas: No dilatation of the main pancreatic duct. The pancreatic
tail blends into hypo enhancing low-attenuation retroperitoneal soft
tissue, as before.

Spleen: Spleen is deformed and displaced by intraperitoneal fluid.

Adrenals/Urinary Tract: No adrenal nodule or mass. No enhancing
renal lesion. No hydronephrosis. No hydroureter. Urinary bladder is
unremarkable.

Stomach/Bowel: Stomach is nondistended. No gastric wall thickening.
No evidence of outlet obstruction. Duodenum is normally positioned
as is the ligament of Treitz. No overt small bowel obstruction.
Terminal ileum is normal. The appendix is not visualized, but there
is no edema or inflammation in the region of the cecum.
Extraperitoneal segments of the colon are compressed by the
intraperitoneal fluid.

Vascular/Lymphatic: There is abdominal aortic atherosclerosis
without aneurysm. Portal vein remains markedly attenuated in the
hepatis due to tumor, but portal vein does remain patent. Superior
mesenteric vein is patent. The bulky omental disease has decreased
slightly in the interval. This is difficult to reliably measure
given the amorphous shape, but the disease was about 4 cm in
thickness previously compared to 3 cm today.

Reproductive: Prostate gland and seminal vesicles are unremarkable.

Other: Large volume abdominal and pelvic intraperitoneal fluid/
tumor evident. Imaging features suggest malignant ascites with
interval progression. The distance between the liver capsule and the
abdominal wall was 3.2 cm on the previous study secondary to
displacement of liver by the intraperitoneal fluid. This same
measurement is now 5.1 cm.

Musculoskeletal: Bone windows reveal no worrisome lytic or sclerotic
osseous lesions.
IMPRESSION: Interval progression of malignant ascites.

Slight interval decrease in posterior left hepatic lesion and in
bulkiness of the omental disease.

Continued attenuation of the portal vein in the porta hepatis
although the vessel does remain patent.
